# Patient Record
Sex: Female | Born: 1935 | Race: White | Hispanic: No | Marital: Single | Smoking: Former smoker
Health system: Southern US, Community
[De-identification: ages and names within clinical notes are randomized; demographics above are authoritative.]

## PROBLEM LIST (undated history)

## (undated) DIAGNOSIS — J9610 Chronic respiratory failure, unspecified whether with hypoxia or hypercapnia: Secondary | ICD-10-CM

## (undated) DIAGNOSIS — F0390 Unspecified dementia without behavioral disturbance: Secondary | ICD-10-CM

## (undated) DIAGNOSIS — M1990 Unspecified osteoarthritis, unspecified site: Secondary | ICD-10-CM

## (undated) DIAGNOSIS — J449 Chronic obstructive pulmonary disease, unspecified: Secondary | ICD-10-CM

## (undated) DIAGNOSIS — I10 Essential (primary) hypertension: Secondary | ICD-10-CM

## (undated) HISTORY — PX: JOINT REPLACEMENT: SHX530

## (undated) HISTORY — PX: BREAST LUMPECTOMY: SHX2

## (undated) HISTORY — PX: DILATION AND CURETTAGE, DIAGNOSTIC / THERAPEUTIC: SUR384

## (undated) HISTORY — PX: OTHER SURGICAL HISTORY: SHX169

## (undated) HISTORY — PX: TONSILLECTOMY: SUR1361

---

## 2011-09-09 ENCOUNTER — Emergency Department: Payer: Self-pay | Admitting: Emergency Medicine

## 2012-05-26 LAB — URINALYSIS, COMPLETE
Bacteria: NONE SEEN
Bilirubin,UR: NEGATIVE
Glucose,UR: NEGATIVE mg/dL (ref 0–75)
Nitrite: NEGATIVE
Protein: NEGATIVE
RBC,UR: <1 /HPF (ref 0–5)
Specific Gravity: 1.02 (ref 1.003–1.030)
Squamous Epithelial: NONE SEEN
WBC UR: 1 /HPF (ref 0–5)

## 2012-05-26 LAB — CBC
HCT: 36.9 % (ref 35.0–47.0)
HGB: 12.5 g/dL (ref 12.0–16.0)
MCH: 30.7 pg (ref 26.0–34.0)
MCHC: 33.8 g/dL (ref 32.0–36.0)
MCV: 91 fL (ref 80–100)
Platelet: 184 10*3/uL (ref 150–440)
RDW: 13.4 % (ref 11.5–14.5)
WBC: 13.7 10*3/uL — ABNORMAL HIGH (ref 3.6–11.0)

## 2012-05-26 LAB — BASIC METABOLIC PANEL
Anion Gap: 7 (ref 7–16)
BUN: 14 mg/dL (ref 7–18)
Calcium, Total: 8.7 mg/dL (ref 8.5–10.1)
Chloride: 96 mmol/L — ABNORMAL LOW (ref 98–107)
Co2: 29 mmol/L (ref 21–32)
Creatinine: 0.59 mg/dL — ABNORMAL LOW (ref 0.60–1.30)
EGFR (African American): >60
Glucose: 159 mg/dL — ABNORMAL HIGH (ref 65–99)
Osmolality: 268 (ref 275–301)
Sodium: 132 mmol/L — ABNORMAL LOW (ref 136–145)

## 2012-05-27 LAB — BASIC METABOLIC PANEL
Anion Gap: 4 — ABNORMAL LOW (ref 7–16)
Chloride: 95 mmol/L — ABNORMAL LOW (ref 98–107)
Co2: 31 mmol/L (ref 21–32)
Creatinine: 0.78 mg/dL (ref 0.60–1.30)
Sodium: 130 mmol/L — ABNORMAL LOW (ref 136–145)

## 2012-05-27 LAB — CBC WITH DIFFERENTIAL/PLATELET
Eosinophil #: 0 10*3/uL (ref 0.0–0.7)
HCT: 32.9 % — ABNORMAL LOW (ref 35.0–47.0)
HGB: 10.9 g/dL — ABNORMAL LOW (ref 12.0–16.0)
Lymphocyte %: 3.4 %
MCH: 30.4 pg (ref 26.0–34.0)
MCHC: 33.1 g/dL (ref 32.0–36.0)
Neutrophil #: 10.2 10*3/uL — ABNORMAL HIGH (ref 1.4–6.5)
RDW: 13.5 % (ref 11.5–14.5)

## 2012-05-28 ENCOUNTER — Inpatient Hospital Stay: Payer: Self-pay | Admitting: Internal Medicine

## 2012-05-31 LAB — SODIUM: Sodium: 131 mmol/L — ABNORMAL LOW (ref 136–145)

## 2012-06-01 ENCOUNTER — Encounter: Payer: Self-pay | Admitting: Internal Medicine

## 2012-06-03 LAB — CULTURE, BLOOD (SINGLE)

## 2012-06-15 ENCOUNTER — Encounter: Payer: Self-pay | Admitting: Internal Medicine

## 2012-07-16 ENCOUNTER — Encounter: Payer: Self-pay | Admitting: Internal Medicine

## 2012-07-19 ENCOUNTER — Emergency Department: Payer: Self-pay | Admitting: Emergency Medicine

## 2012-09-15 HISTORY — PX: TONSILLECTOMY: SHX5217

## 2012-12-23 LAB — URINALYSIS, COMPLETE
Bacteria: NONE SEEN
Blood: NEGATIVE
Nitrite: NEGATIVE
Protein: NEGATIVE
Squamous Epithelial: <1

## 2012-12-23 LAB — COMPREHENSIVE METABOLIC PANEL
Alkaline Phosphatase: 93 U/L (ref 50–136)
Anion Gap: 5 — ABNORMAL LOW (ref 7–16)
BUN: 8 mg/dL (ref 7–18)
Bilirubin,Total: 0.5 mg/dL (ref 0.2–1.0)
Calcium, Total: 8.9 mg/dL (ref 8.5–10.1)
Co2: 31 mmol/L (ref 21–32)
Creatinine: 0.51 mg/dL — ABNORMAL LOW (ref 0.60–1.30)
EGFR (African American): >60
EGFR (Non-African Amer.): >60
Potassium: 4.1 mmol/L (ref 3.5–5.1)
SGOT(AST): 21 U/L (ref 15–37)
Sodium: 132 mmol/L — ABNORMAL LOW (ref 136–145)
Total Protein: 6.7 g/dL (ref 6.4–8.2)

## 2012-12-23 LAB — CK TOTAL AND CKMB (NOT AT ARMC)
CK, Total: 91 U/L (ref 21–215)
CK-MB: 1.3 ng/mL (ref 0.5–3.6)

## 2012-12-23 LAB — CBC WITH DIFFERENTIAL/PLATELET
Basophil %: 0.4 %
Eosinophil #: 0 10*3/uL (ref 0.0–0.7)
Eosinophil %: 0.4 %
HCT: 39.4 % (ref 35.0–47.0)
HGB: 13.2 g/dL (ref 12.0–16.0)
Lymphocyte %: 5.2 %
MCH: 30 pg (ref 26.0–34.0)
MCHC: 33.4 g/dL (ref 32.0–36.0)
MCV: 90 fL (ref 80–100)
Monocyte %: 10.2 %
Platelet: 245 10*3/uL (ref 150–440)
RBC: 4.39 10*6/uL (ref 3.80–5.20)
WBC: 10.5 10*3/uL (ref 3.6–11.0)

## 2012-12-23 LAB — TROPONIN I: Troponin-I: <0.02 ng/mL

## 2012-12-24 ENCOUNTER — Inpatient Hospital Stay: Payer: Self-pay | Admitting: Internal Medicine

## 2012-12-26 LAB — CBC WITH DIFFERENTIAL/PLATELET
Basophil #: 0 10*3/uL (ref 0.0–0.1)
Basophil %: 0.2 %
Eosinophil %: 0.1 %
HGB: 11.5 g/dL — ABNORMAL LOW (ref 12.0–16.0)
Lymphocyte #: 0.3 10*3/uL — ABNORMAL LOW (ref 1.0–3.6)
MCH: 29.2 pg (ref 26.0–34.0)
MCHC: 32.6 g/dL (ref 32.0–36.0)
Monocyte %: 7.4 %
Neutrophil #: 12.1 10*3/uL — ABNORMAL HIGH (ref 1.4–6.5)
Neutrophil %: 89.8 %
RDW: 13.2 % (ref 11.5–14.5)

## 2012-12-27 LAB — BASIC METABOLIC PANEL
Anion Gap: 5 — ABNORMAL LOW (ref 7–16)
BUN: 13 mg/dL (ref 7–18)
Calcium, Total: 8.9 mg/dL (ref 8.5–10.1)
Creatinine: 0.45 mg/dL — ABNORMAL LOW (ref 0.60–1.30)
EGFR (African American): >60
EGFR (Non-African Amer.): >60
Glucose: 127 mg/dL — ABNORMAL HIGH (ref 65–99)
Osmolality: 270 (ref 275–301)
Potassium: 3.5 mmol/L (ref 3.5–5.1)
Sodium: 134 mmol/L — ABNORMAL LOW (ref 136–145)

## 2012-12-29 LAB — CBC WITH DIFFERENTIAL/PLATELET
Basophil #: 0 10*3/uL (ref 0.0–0.1)
Basophil %: 0.1 %
Eosinophil %: 0 %
HCT: 35.6 % (ref 35.0–47.0)
Lymphocyte %: 2.5 %
MCH: 30.1 pg (ref 26.0–34.0)
MCHC: 33.7 g/dL (ref 32.0–36.0)
Monocyte %: 2.4 %
Neutrophil #: 10.3 10*3/uL — ABNORMAL HIGH (ref 1.4–6.5)
Neutrophil %: 95 %
Platelet: 292 10*3/uL (ref 150–440)
RBC: 3.99 10*6/uL (ref 3.80–5.20)
RDW: 13 % (ref 11.5–14.5)

## 2012-12-31 LAB — BASIC METABOLIC PANEL
Anion Gap: 8 (ref 7–16)
BUN: 19 mg/dL — ABNORMAL HIGH (ref 7–18)
BUN: 19 mg/dL — ABNORMAL HIGH (ref 7–18)
Calcium, Total: 8.4 mg/dL — ABNORMAL LOW (ref 8.5–10.1)
Calcium, Total: 8.2 mg/dL — ABNORMAL LOW (ref 8.5–10.1)
Chloride: 96 mmol/L — ABNORMAL LOW (ref 98–107)
Chloride: 103 mmol/L (ref 98–107)
Creatinine: 0.5 mg/dL — ABNORMAL LOW (ref 0.60–1.30)
Creatinine: 0.5 mg/dL — ABNORMAL LOW (ref 0.60–1.30)
EGFR (African American): >60
Glucose: 193 mg/dL — ABNORMAL HIGH (ref 65–99)
Osmolality: 271 (ref 275–301)
Osmolality: 287 (ref 275–301)
Potassium: 3.9 mmol/L (ref 3.5–5.1)
Sodium: 132 mmol/L — ABNORMAL LOW (ref 136–145)
Sodium: 140 mmol/L (ref 136–145)

## 2012-12-31 LAB — CBC WITH DIFFERENTIAL/PLATELET
Eosinophil #: 0 10*3/uL (ref 0.0–0.7)
HCT: 38 % (ref 35.0–47.0)
HGB: 12.2 g/dL (ref 12.0–16.0)
Lymphocyte %: 3.3 %
MCH: 28.9 pg (ref 26.0–34.0)
MCHC: 32.1 g/dL (ref 32.0–36.0)
Neutrophil #: 14.9 10*3/uL — ABNORMAL HIGH (ref 1.4–6.5)
RDW: 13.1 % (ref 11.5–14.5)
WBC: 16.2 10*3/uL — ABNORMAL HIGH (ref 3.6–11.0)

## 2013-01-01 LAB — BASIC METABOLIC PANEL
Anion Gap: 4 — ABNORMAL LOW (ref 7–16)
BUN: 11 mg/dL (ref 7–18)
Calcium, Total: 8.5 mg/dL (ref 8.5–10.1)
Chloride: 97 mmol/L — ABNORMAL LOW (ref 98–107)
Co2: 35 mmol/L — ABNORMAL HIGH (ref 21–32)
Creatinine: 0.28 mg/dL — ABNORMAL LOW (ref 0.60–1.30)
EGFR (African American): >60
Glucose: 144 mg/dL — ABNORMAL HIGH (ref 65–99)
Osmolality: 274 (ref 275–301)
Potassium: 3.1 mmol/L — ABNORMAL LOW (ref 3.5–5.1)

## 2013-01-01 LAB — WBC: WBC: 12.7 10*3/uL — ABNORMAL HIGH (ref 3.6–11.0)

## 2013-01-02 LAB — CULTURE, BLOOD (SINGLE)

## 2013-01-02 LAB — POTASSIUM: Potassium: 3.5 mmol/L (ref 3.5–5.1)

## 2013-01-03 LAB — BASIC METABOLIC PANEL
Anion Gap: 5 — ABNORMAL LOW (ref 7–16)
Calcium, Total: 8.1 mg/dL — ABNORMAL LOW (ref 8.5–10.1)
Chloride: 95 mmol/L — ABNORMAL LOW (ref 98–107)
Creatinine: 0.39 mg/dL — ABNORMAL LOW (ref 0.60–1.30)
EGFR (Non-African Amer.): >60
Osmolality: 270 (ref 275–301)
Potassium: 3.1 mmol/L — ABNORMAL LOW (ref 3.5–5.1)
Sodium: 134 mmol/L — ABNORMAL LOW (ref 136–145)

## 2013-02-13 ENCOUNTER — Ambulatory Visit: Payer: Self-pay | Admitting: Internal Medicine

## 2013-02-26 LAB — COMPREHENSIVE METABOLIC PANEL
Albumin: 3.3 g/dL — ABNORMAL LOW (ref 3.4–5.0)
Alkaline Phosphatase: 99 U/L (ref 50–136)
BUN: 15 mg/dL (ref 7–18)
Calcium, Total: 9.1 mg/dL (ref 8.5–10.1)
Co2: 35 mmol/L — ABNORMAL HIGH (ref 21–32)
Glucose: 121 mg/dL — ABNORMAL HIGH (ref 65–99)
Potassium: 3.9 mmol/L (ref 3.5–5.1)
SGOT(AST): 29 U/L (ref 15–37)
SGPT (ALT): 19 U/L (ref 12–78)
Sodium: 134 mmol/L — ABNORMAL LOW (ref 136–145)
Total Protein: 6.7 g/dL (ref 6.4–8.2)

## 2013-02-26 LAB — CBC WITH DIFFERENTIAL/PLATELET
Basophil #: 0.1 10*3/uL (ref 0.0–0.1)
Basophil %: 1.2 %
Eosinophil %: 1.4 %
HCT: 34.8 % — ABNORMAL LOW (ref 35.0–47.0)
Lymphocyte #: 0.9 10*3/uL — ABNORMAL LOW (ref 1.0–3.6)
Lymphocyte %: 13.7 %
MCHC: 33.7 g/dL (ref 32.0–36.0)
MCV: 90 fL (ref 80–100)
Monocyte %: 10.8 %
RBC: 3.89 10*6/uL (ref 3.80–5.20)
RDW: 15.8 % — ABNORMAL HIGH (ref 11.5–14.5)
WBC: 6.7 10*3/uL (ref 3.6–11.0)

## 2013-02-26 LAB — TROPONIN I: Troponin-I: <0.02 ng/mL

## 2013-02-26 LAB — CK TOTAL AND CKMB (NOT AT ARMC)
CK, Total: 44 U/L (ref 21–215)
CK-MB: 2.1 ng/mL (ref 0.5–3.6)

## 2013-02-26 LAB — PRO B NATRIURETIC PEPTIDE: B-Type Natriuretic Peptide: 605 pg/mL — ABNORMAL HIGH (ref 0–450)

## 2013-02-27 ENCOUNTER — Inpatient Hospital Stay: Payer: Self-pay | Admitting: Internal Medicine

## 2013-02-27 LAB — CBC WITH DIFFERENTIAL/PLATELET
Basophil #: 0 10*3/uL (ref 0.0–0.1)
Basophil %: 0.1 %
Eosinophil %: 0.1 %
HGB: 11.3 g/dL — ABNORMAL LOW (ref 12.0–16.0)
Lymphocyte %: 8.9 %
MCH: 30.2 pg (ref 26.0–34.0)
MCV: 89 fL (ref 80–100)
Monocyte %: 1.9 %
Neutrophil #: 4.9 10*3/uL (ref 1.4–6.5)
Neutrophil %: 89 %
Platelet: 201 10*3/uL (ref 150–440)
RDW: 15.9 % — ABNORMAL HIGH (ref 11.5–14.5)
WBC: 5.5 10*3/uL (ref 3.6–11.0)

## 2013-02-27 LAB — BASIC METABOLIC PANEL
Anion Gap: 6 — ABNORMAL LOW (ref 7–16)
BUN: 11 mg/dL (ref 7–18)
Calcium, Total: 8.9 mg/dL (ref 8.5–10.1)
Chloride: 99 mmol/L (ref 98–107)
Co2: 31 mmol/L (ref 21–32)
Creatinine: 0.59 mg/dL — ABNORMAL LOW (ref 0.60–1.30)
Potassium: 4.1 mmol/L (ref 3.5–5.1)
Sodium: 136 mmol/L (ref 136–145)

## 2013-02-28 LAB — IRON AND TIBC
Iron Saturation: 37 %
Unbound Iron-Bind.Cap.: 162 ug/dL

## 2013-03-01 LAB — CREATININE, SERUM: EGFR (Non-African Amer.): >60

## 2014-03-01 ENCOUNTER — Inpatient Hospital Stay: Payer: Self-pay | Admitting: Internal Medicine

## 2014-03-01 LAB — CK TOTAL AND CKMB (NOT AT ARMC)
CK, Total: 69 U/L
CK-MB: 1.7 ng/mL (ref 0.5–3.6)

## 2014-03-01 LAB — COMPREHENSIVE METABOLIC PANEL
Albumin: 3.2 g/dL — ABNORMAL LOW (ref 3.4–5.0)
Alkaline Phosphatase: 76 U/L
Anion Gap: 7 (ref 7–16)
BILIRUBIN TOTAL: 0.6 mg/dL (ref 0.2–1.0)
BUN: 11 mg/dL (ref 7–18)
CALCIUM: 8.7 mg/dL (ref 8.5–10.1)
CO2: 32 mmol/L (ref 21–32)
Chloride: 92 mmol/L — ABNORMAL LOW (ref 98–107)
Creatinine: 0.63 mg/dL (ref 0.60–1.30)
EGFR (African American): >60
Glucose: 136 mg/dL — ABNORMAL HIGH (ref 65–99)
Osmolality: 264 (ref 275–301)
Potassium: 3.5 mmol/L (ref 3.5–5.1)
SGOT(AST): 21 U/L (ref 15–37)
SGPT (ALT): 16 U/L (ref 12–78)
SODIUM: 131 mmol/L — AB (ref 136–145)
Total Protein: 7.1 g/dL (ref 6.4–8.2)

## 2014-03-01 LAB — CBC
HCT: 34.9 % — ABNORMAL LOW (ref 35.0–47.0)
HGB: 11.6 g/dL — ABNORMAL LOW (ref 12.0–16.0)
MCH: 31.1 pg (ref 26.0–34.0)
MCHC: 33.3 g/dL (ref 32.0–36.0)
MCV: 94 fL (ref 80–100)
Platelet: 210 10*3/uL (ref 150–440)
RBC: 3.73 10*6/uL — ABNORMAL LOW (ref 3.80–5.20)
RDW: 13.1 % (ref 11.5–14.5)
WBC: 9.6 10*3/uL (ref 3.6–11.0)

## 2014-03-01 LAB — TROPONIN I: Troponin-I: <0.02 ng/mL

## 2014-03-02 LAB — CBC WITH DIFFERENTIAL/PLATELET
Basophil #: 0 10*3/uL (ref 0.0–0.1)
Basophil %: 0.1 %
Eosinophil #: 0 10*3/uL (ref 0.0–0.7)
Eosinophil %: 0 %
HCT: 31.7 % — ABNORMAL LOW (ref 35.0–47.0)
HGB: 10.7 g/dL — ABNORMAL LOW (ref 12.0–16.0)
Lymphocyte #: 0.5 10*3/uL — ABNORMAL LOW (ref 1.0–3.6)
Lymphocyte %: 4.4 %
MCH: 31.1 pg (ref 26.0–34.0)
MCHC: 33.6 g/dL (ref 32.0–36.0)
MCV: 93 fL (ref 80–100)
MONOS PCT: 5.3 %
Monocyte #: 0.6 x10 3/mm (ref 0.2–0.9)
NEUTROS PCT: 90.2 %
Neutrophil #: 10.5 10*3/uL — ABNORMAL HIGH (ref 1.4–6.5)
Platelet: 253 10*3/uL (ref 150–440)
RBC: 3.43 10*6/uL — ABNORMAL LOW (ref 3.80–5.20)
RDW: 13.1 % (ref 11.5–14.5)
WBC: 11.7 10*3/uL — ABNORMAL HIGH (ref 3.6–11.0)

## 2014-03-02 LAB — URINALYSIS, COMPLETE
BILIRUBIN, UR: NEGATIVE
Blood: NEGATIVE
Glucose,UR: 150 mg/dL (ref 0–75)
Ketone: NEGATIVE
Leukocyte Esterase: NEGATIVE
Nitrite: NEGATIVE
PROTEIN: NEGATIVE
Ph: 5 (ref 4.5–8.0)
RBC,UR: <1 /HPF (ref 0–5)
Specific Gravity: 1.017 (ref 1.003–1.030)
Squamous Epithelial: <1
WBC UR: 3 /HPF (ref 0–5)

## 2014-03-02 LAB — CREATININE, SERUM
Creatinine: 0.68 mg/dL (ref 0.60–1.30)
EGFR (African American): >60

## 2014-03-03 LAB — BASIC METABOLIC PANEL
Anion Gap: 6 — ABNORMAL LOW (ref 7–16)
BUN: 16 mg/dL (ref 7–18)
CHLORIDE: 92 mmol/L — AB (ref 98–107)
Calcium, Total: 9.4 mg/dL (ref 8.5–10.1)
Co2: 32 mmol/L (ref 21–32)
Creatinine: 0.66 mg/dL (ref 0.60–1.30)
GLUCOSE: 176 mg/dL — AB (ref 65–99)
OSMOLALITY: 266 (ref 275–301)
POTASSIUM: 3.8 mmol/L (ref 3.5–5.1)
Sodium: 130 mmol/L — ABNORMAL LOW (ref 136–145)

## 2014-03-03 LAB — MAGNESIUM: Magnesium: 1.5 mg/dL — ABNORMAL LOW

## 2014-03-04 LAB — BASIC METABOLIC PANEL
ANION GAP: 5 — AB (ref 7–16)
BUN: 15 mg/dL (ref 7–18)
CO2: 34 mmol/L — AB (ref 21–32)
CREATININE: 0.61 mg/dL (ref 0.60–1.30)
Calcium, Total: 8.5 mg/dL (ref 8.5–10.1)
Chloride: 96 mmol/L — ABNORMAL LOW (ref 98–107)
EGFR (African American): >60
EGFR (Non-African Amer.): >60
Glucose: 194 mg/dL — ABNORMAL HIGH (ref 65–99)
Osmolality: 276 (ref 275–301)
Potassium: 3.6 mmol/L (ref 3.5–5.1)
Sodium: 135 mmol/L — ABNORMAL LOW (ref 136–145)

## 2014-03-04 LAB — MAGNESIUM: Magnesium: 2.1 mg/dL

## 2014-03-06 LAB — CULTURE, BLOOD (SINGLE)

## 2014-06-19 IMAGING — CT CT HEAD WITHOUT CONTRAST
2 series · 15 of 30 positions shown, 19 images · non-contrast
Comparison: none

REASON FOR EXAM: trauma, fall, head injury
COMMENTS:

[Series 2: without · axial · non-contrast · 0.42mm/px · z∈[+436,+561]mm · 13 of 31 slices shown, 17 images]
[im 3/31  brain]
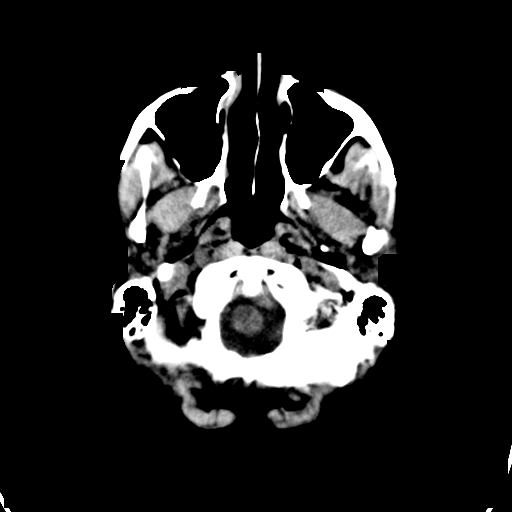
[im 3/31  bone]
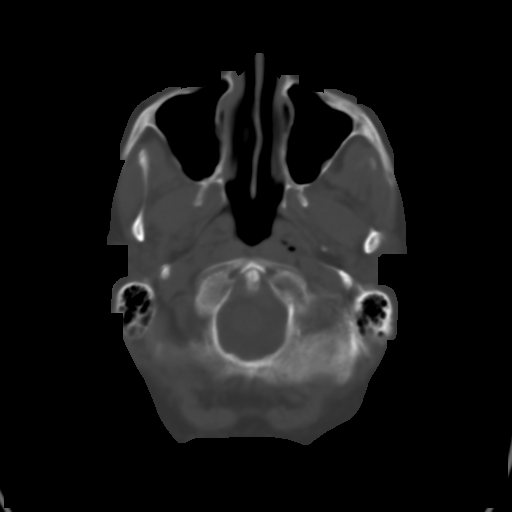
[im 5/31  brain]
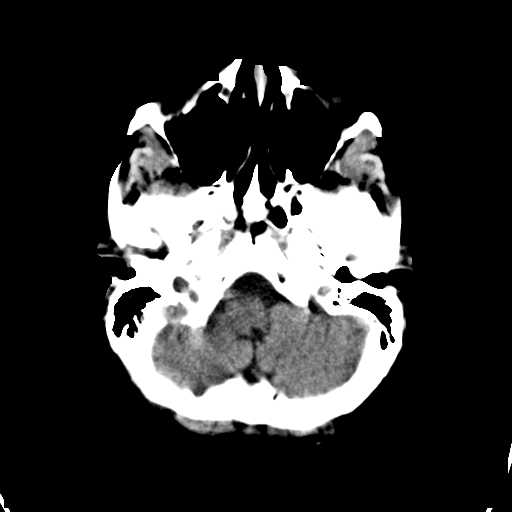
[im 7/31  brain]
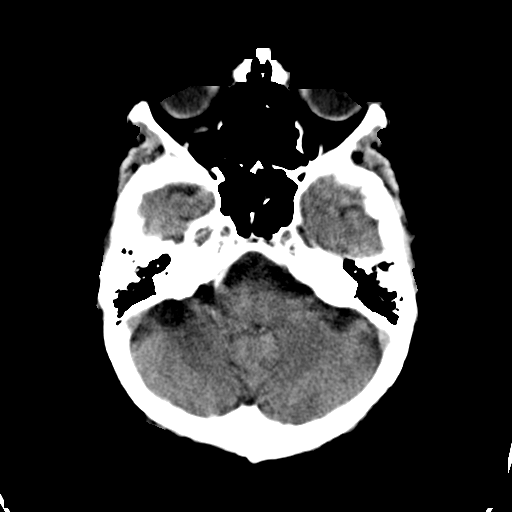
[im 9/31  brain]
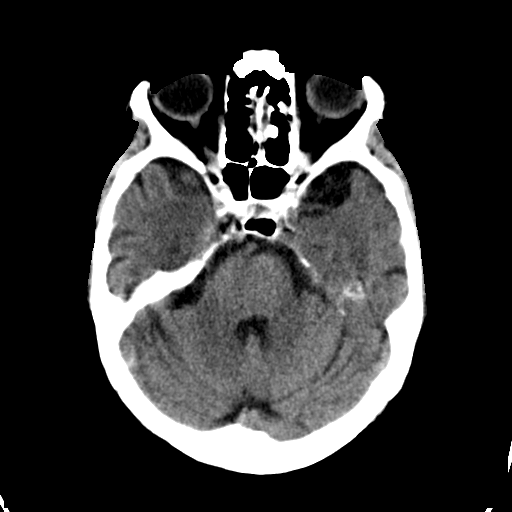
[im 11/31  brain]
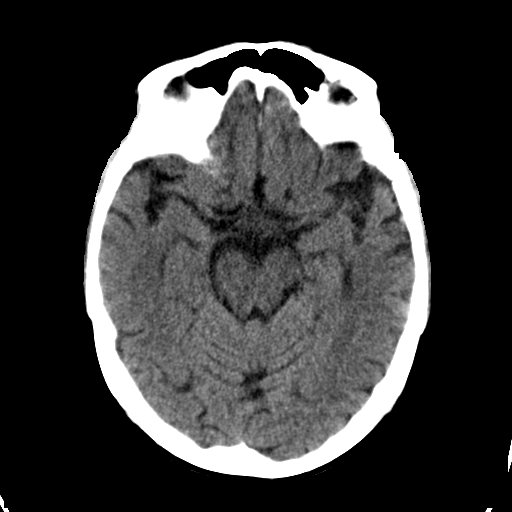
[im 11/31  bone]
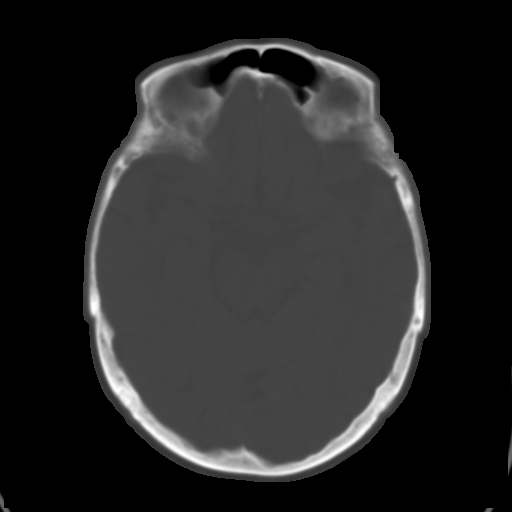
[im 13/31  brain]
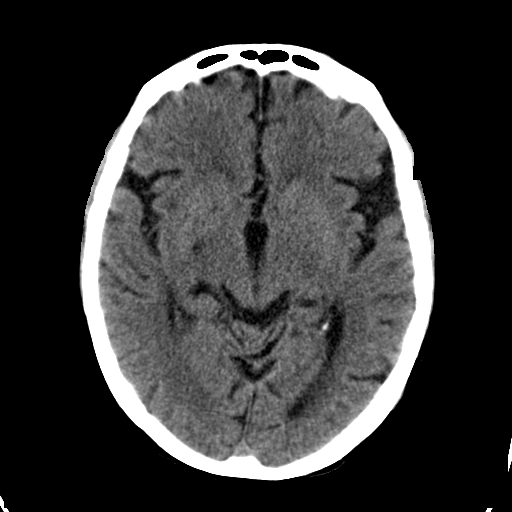
[im 16/31  brain]
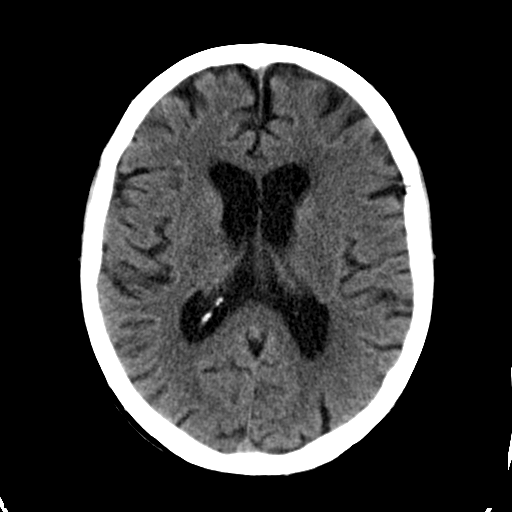
[im 18/31  brain]
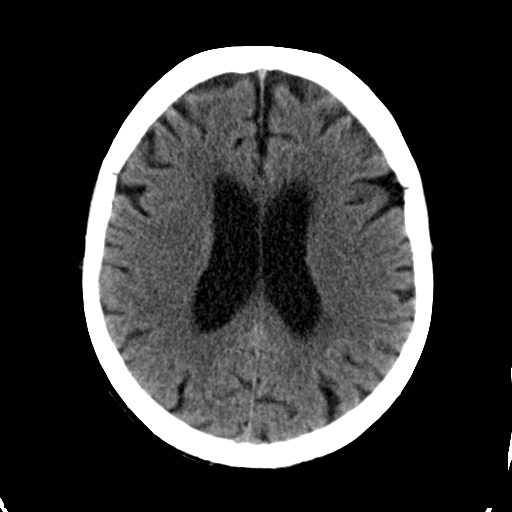
[im 20/31  brain]
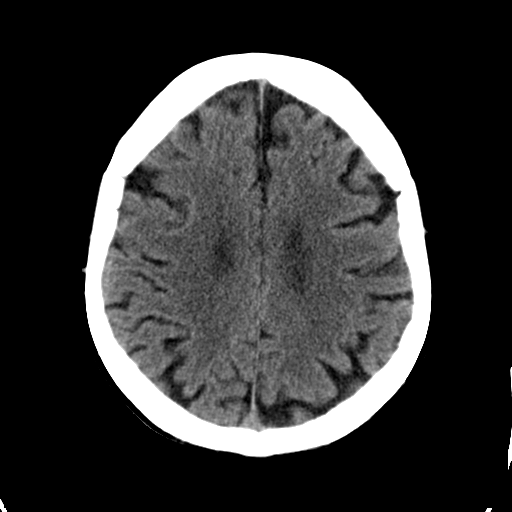
[im 20/31  bone]
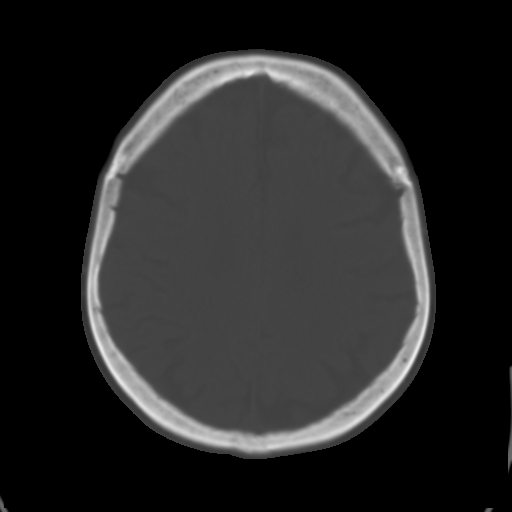
[im 22/31  brain]
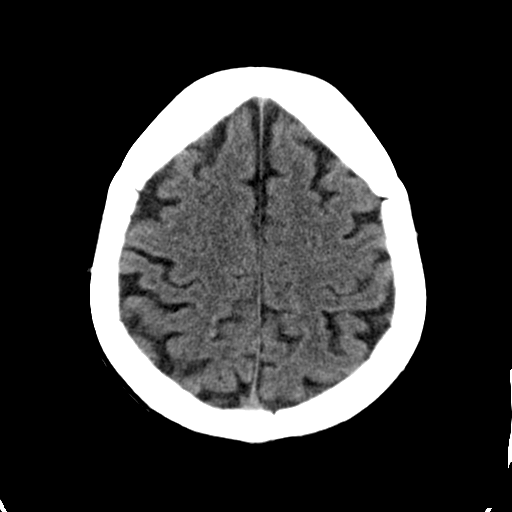
[im 24/31  brain]
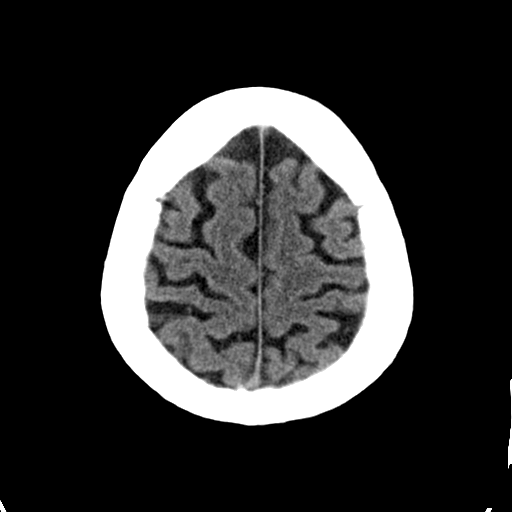
[im 26/31  brain]
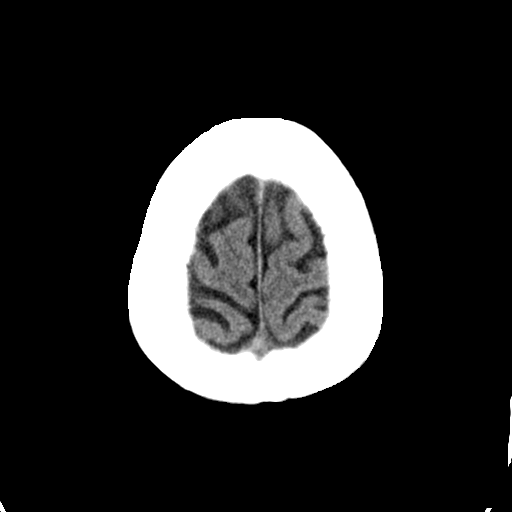
[im 28/31  brain]
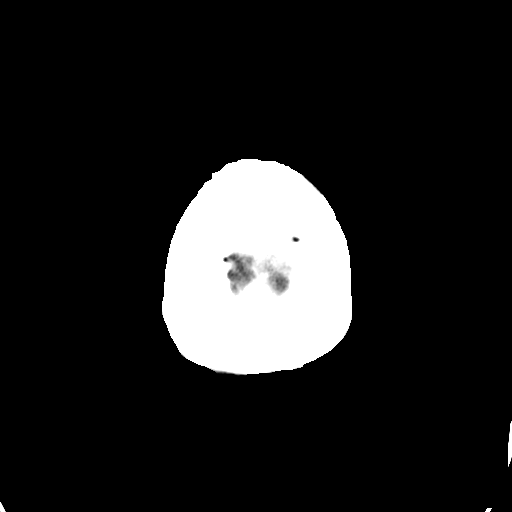
[im 28/31  bone]
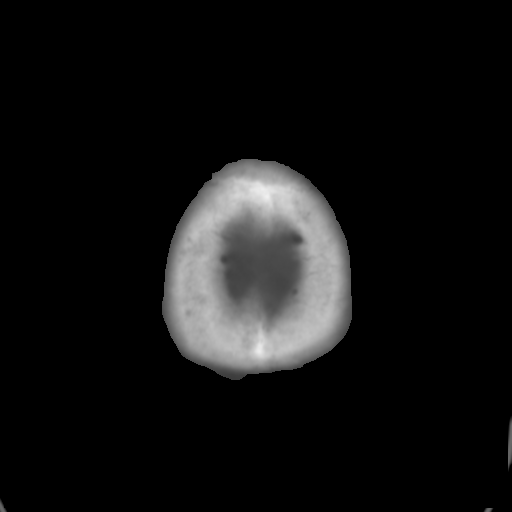

[Series 3: bone · axial · 0.42mm/px · z∈[+436,+456]mm · 2 of 31 slices shown]
[im 3/31  bone]
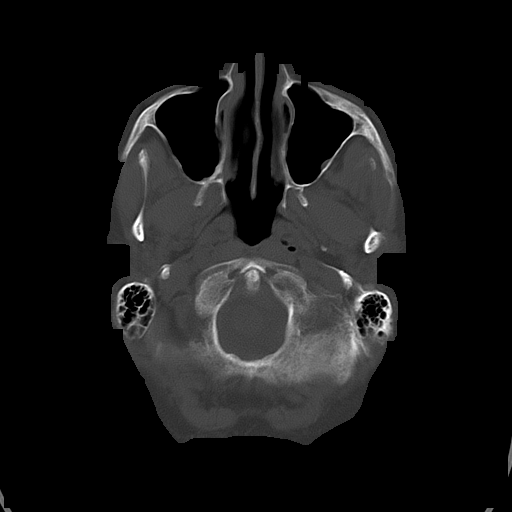
[im 7/31  bone]
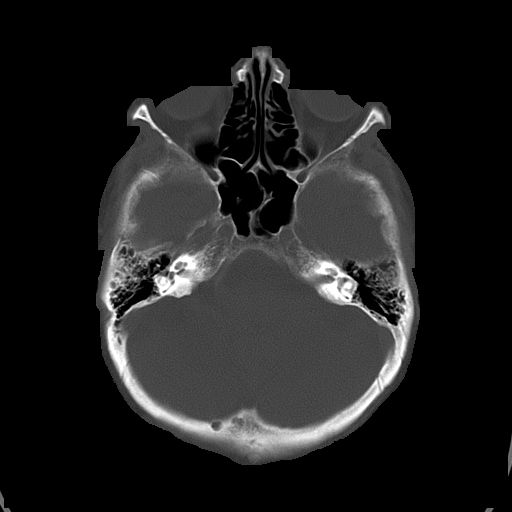

[15 of 30 positions shown; findings below may reference images not displayed]

PROCEDURE:     CT  - CT HEAD WITHOUT CONTRAST  - July 19, 2012  [DATE]

RESULT:     Axial noncontrast CT scanning was performed through the brain
with reconstructions at 5 mm intervals and slice thicknesses. Comparison is
made to the study May 26, 2012.

There is moderate diffuse cerebral and cerebellar atrophy with compensatory
ventriculomegaly. These findings are stable. There is no intracranial
hemorrhage nor intracranial mass effect. There is likely an old lacunar
infarction in the posterior limb of the right internal capsule. There is no
evidence of an evolving ischemic infarction. At bone window settings the
observed portions of the paranasal sinuses and mastoid air cells are clear.
There is no evidence of an acute skull fracture.
IMPRESSION: 1. There is no evidence of an acute ischemic or hemorrhagic event.
2. There are age-related atrophic changes with mild compensatory
ventriculomegaly which appears stable.
3. There is no evidence of an acute skull fracture.

[REDACTED]

## 2014-06-19 IMAGING — CR PELVIS - 1-2 VIEW
1 series · 2 of 2 positions shown · non-contrast
Comparison: none

REASON FOR EXAM: fall, pelvis pain
COMMENTS:

[Series 1: ap · 0.17mm/px · 2 of 2 slices shown]
[im 1/2]
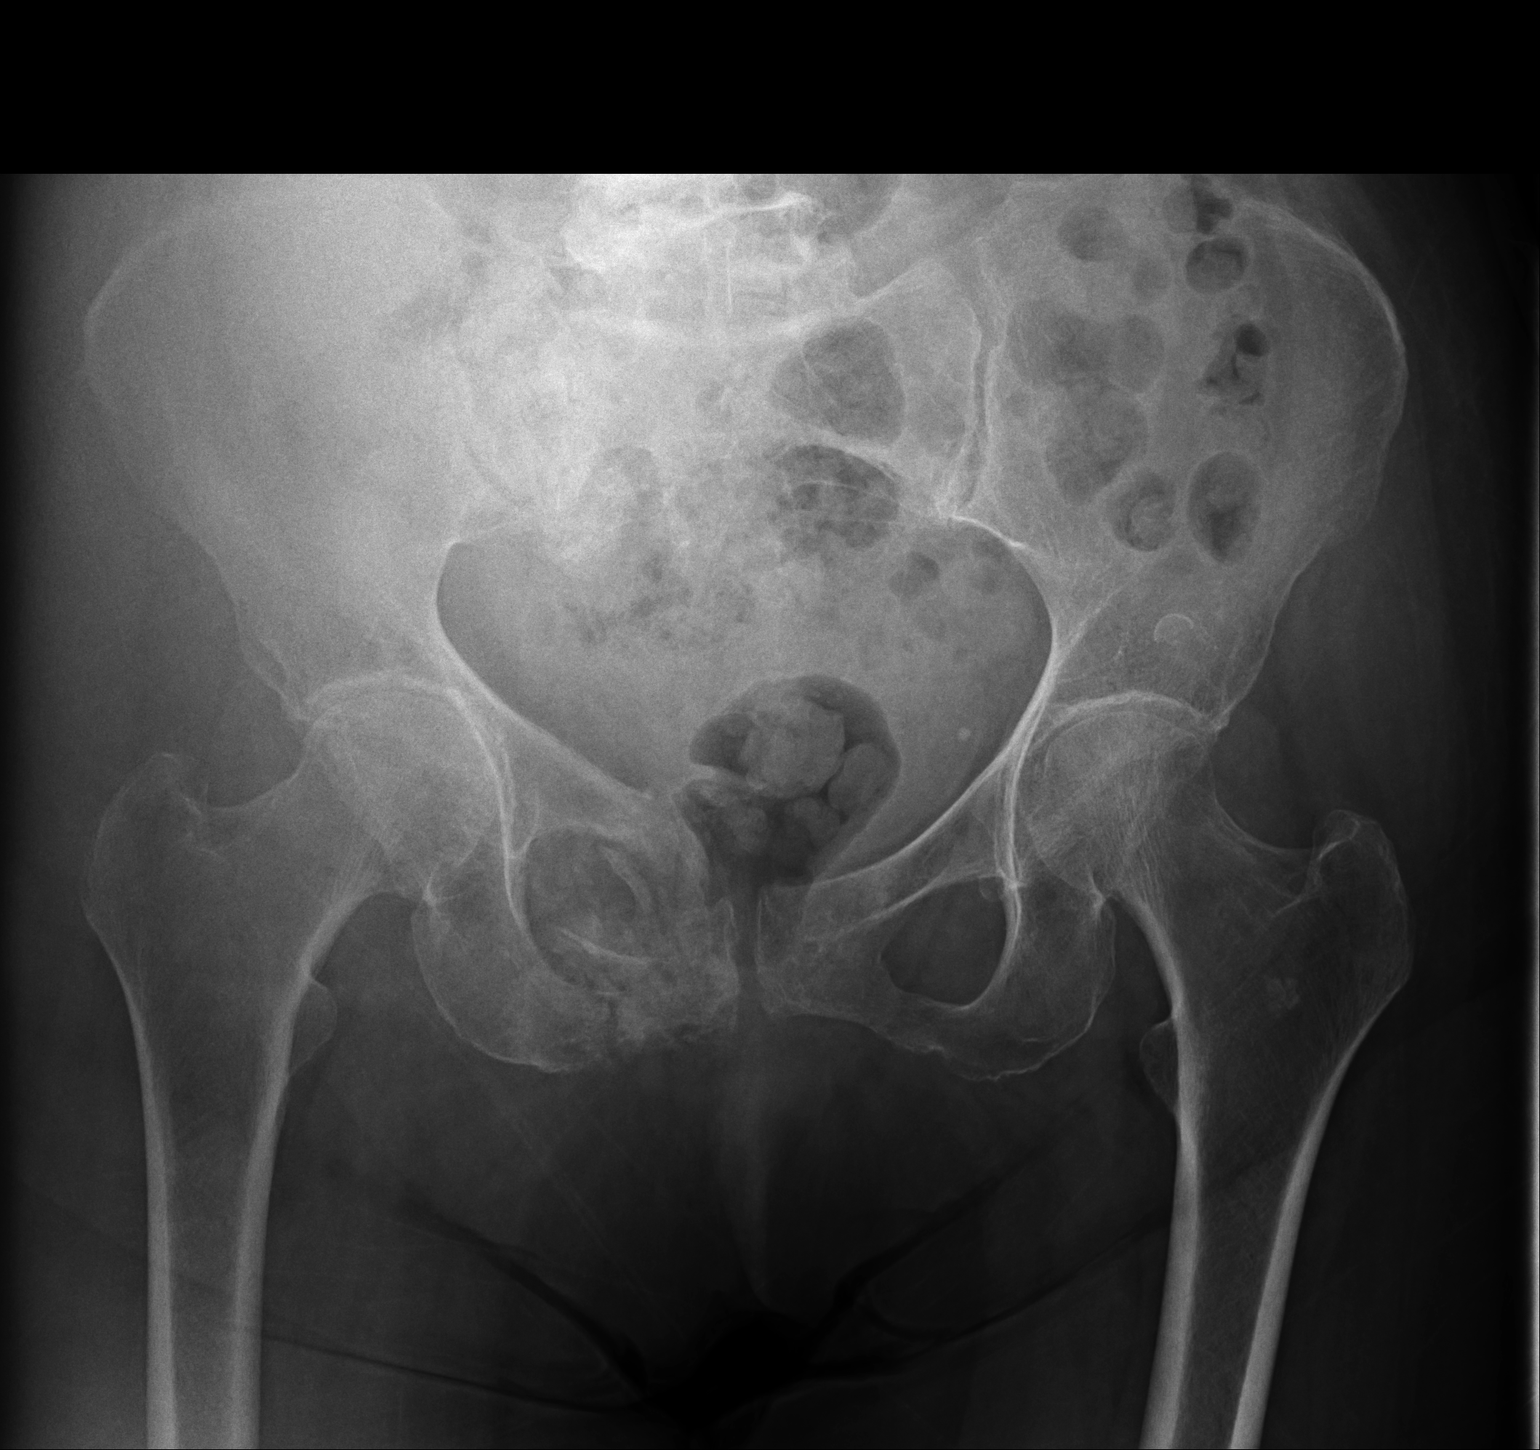
[im 2/2]
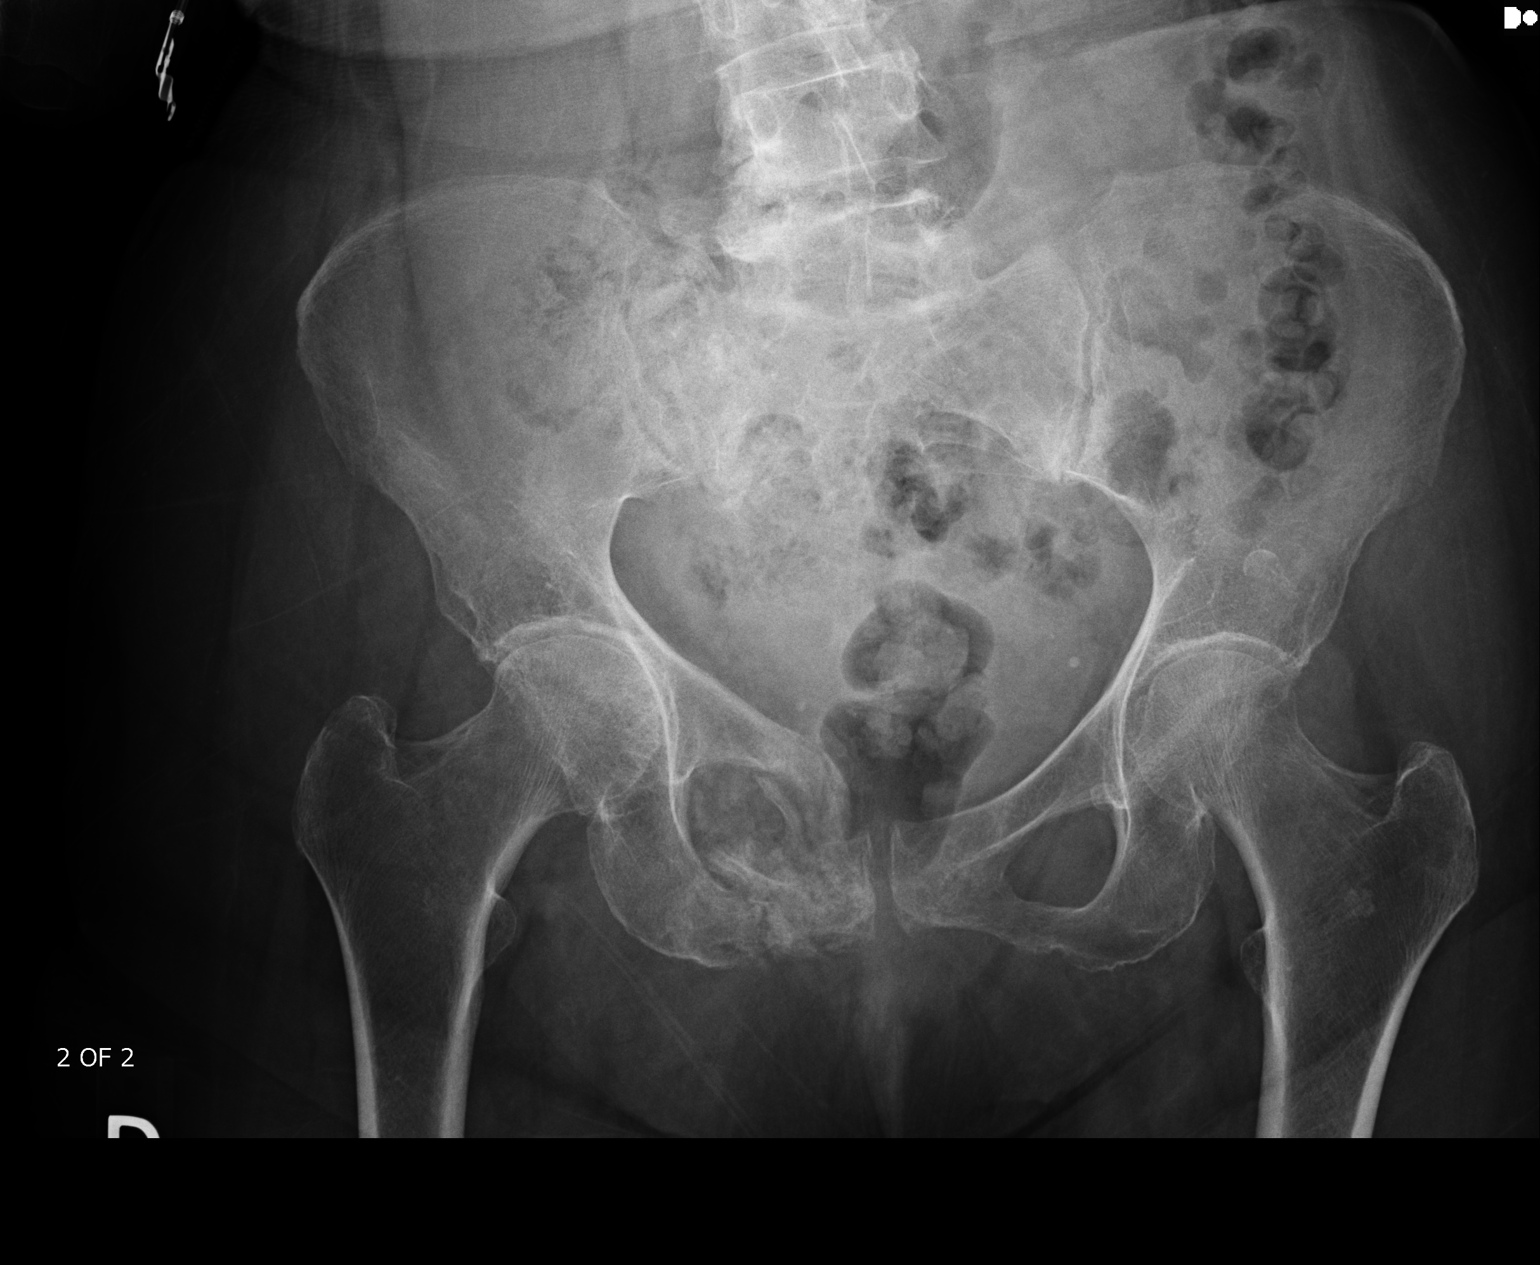

[2 of 2 positions shown; findings below may reference images not displayed]

PROCEDURE:     DXR - DXR PELVIS AP ONLY  - July 19, 2012  [DATE]

RESULT:     The bony pelvis is osteopenic. There are degenerative changes of
both hips. There is deformity of the right superior and inferior pubic rami.
There is evidence of bony remodeling around the previously demonstrated
comminuted fracture of the pubic bones on May 26, 2012. The left
hemipelvis appears intact. As best as can be determined the sacrum is
intact. There is a lordosis associated with the SI joints.
IMPRESSION: There is no evidence of an acute fracture of the pelvis in
this patient with on- going healing of medial right superior and inferior
pubic ramus fractures.

[REDACTED]

## 2015-01-02 NOTE — Consult Note (Signed)
Brief Consult Note: Diagnosis: Right superior and inferior pubic rami fractures of the pelvis.   Patient was seen by consultant.   Recommend further assessment or treatment.   Comments: Patient is a pleasant 81 y/o female who fell [facility] to [facility].  She does not recall the exact mechanism of the fall but states it was dark at the time.  Patient denies significant right hip pain while lying in bed this AM.  She denies numbness tingling or other areas of pain.  She is confused to place this AM and could remember that she was at [facility].  On exam, patient has intact skin.  She can flex and extend her ankle and toes on the right side.  Patient has intact sensation to light touch on the right lower extremity and palpable pedal pulses.  Her radiographs demonstrate comminuted fractures of the superior and inferior rami with mild displacement.  Recommend treating her pain symptomatically.  She should be evaluated by PT for transfers to a chair and patient should be partial WB on the right lower extremity as her pain allows.  She will need a rehab stay and should follow-up in my office in 4-6 weeks for re-evaluation and x-ray.  Electronic Signatures: Juanell FairlyKrasinski, Mayci Haning (MD)  (Signed 12-Sep-13 12:01)  Authored: Brief Consult Note   Last Updated: 12-Sep-13 12:01 by Juanell FairlyKrasinski, Lorell Thibodaux (MD)

## 2015-01-02 NOTE — H&P (Signed)
PATIENT NAME:  Holly Drake, Holly Drake MR#:  [id number] DATE OF BIRTH:  02/26/1924  DATE OF ADMISSION:  05/26/2012  ADMITTING PHYSICIAN: Dr. Enid Baas  PRIMARY CARE PHYSICIAN: Dr. Alonna Buckler  CHIEF COMPLAINT: Fall and right hip pain.   HISTORY OF PRESENT ILLNESS: Holly Drake is an 81 year old elderly female who lives at [facility] independent living facility brought in after she was found on the floor this morning. History is given mostly by the daughter. According to daughter, patient lives at independent living facility and she was last seen by daughter yesterday and was in her normal state of health. This morning patient has a home nurse who comes every morning to give her medications, found her on the floor. It seems apparently according to the patient that last night she went to turn off the light and then in the dark she got tripped and fell on the floor and laid there since last night. She denies any other complaints at this time and is slightly confused and does not have any pain when she is lying down but complains of pain only when she is moving. She is found to have a superior and inferior right pubic rami fracture and being admitted for the same. She denies any fevers, chills, nausea, vomiting, or any other systemic complaints lately.   PAST MEDICAL HISTORY:  1. Depression.  2. Hypertension.  3. Rheumatoid arthritis.  4. Macular degeneration.  PAST SURGICAL HISTORY:  1. Skin cancer resection from the back. 2. Benign cyst removal from the breast.  3. Partial hysterectomy.   ALLERGIES: No known drug allergies.   CURRENT HOME MEDICATIONS:  1. Losartan 50 mg p.o. daily.  2. Sulfasalazine 500 mg p.o. b.i.d.  3. Tylenol Extra Strength PM 500 mg/25 mg, 1 tablet at bedtime. 4. Ginger oral capsule 1 tablet daily.  5. Cranberry oral tablet 1 tablet daily.  6. Celexa 20 mg p.o. daily.  7. Caltrate 600 mg oral daily.   SOCIAL HISTORY: Lives at [facility] independent living facility. No  smoking currently; used to smoke more than 30 years ago. No alcohol use.   FAMILY HISTORY: Mom with Parkinson's, dad lived up to 37 and had a stroke at that age.   REVIEW OF SYSTEMS: CONSTITUTIONAL: No fever, fatigue, or weakness. EYES: Positive for macular degeneration resulting in blurred vision. No glaucoma or cataracts. ENT: Positive for hearing loss and has hearing aids. No tinnitus, ear pain, epistaxis or discharge. RESPIRATORY: No cough, wheeze, hemoptysis, or chronic obstructive pulmonary disease. CARDIOVASCULAR: No chest pain, orthopnea, edema, arrhythmia, palpitations, or syncope. GASTROINTESTINAL: Was nauseous and had vomiting today after she came to the Emergency Room but none before that. No diarrhea, abdominal pain, hematemesis, or melena. GENITOURINARY: Patient is incontinent. No dysuria, hematuria, renal calculus, or frequency. ENDOCRINE: No polyuria, nocturia, thyroid problems, heat or cold intolerance. HEMATOLOGY: No anemia, easy bruising or bleeding. SKIN: No acne, rash, or lesions. MUSCULOSKELETAL: Positive for joint pain as she has rheumatoid arthritis. NEUROLOGIC: No CVA, transient ischemic attack, or seizures. Uses a cane to walk. PSYCHOLOGICAL: No anxiety, insomnia. Positive for depression, not depressed currently.   PHYSICAL EXAMINATION:  VITAL SIGNS: Temperature 97.9 degrees Fahrenheit, pulse 104, respirations 20, blood pressure 178/83, pulse ox 97% on room air.   GENERAL: Well built, well nourished elderly female lying in bed, not in any acute distress.   HEENT: Normocephalic, atraumatic. Pupils equal, round, reacting to light. Anicteric sclerae. Extraocular movements intact. Oropharynx clear without erythema, mass or exudates.   NECK: Supple. No  thyromegaly. No JVD or carotid bruits. No lymphadenopathy.   LUNGS: Moving air bilaterally. No wheeze or crackles. No use of accessory muscles for breathing.   CARDIOVASCULAR: S1, S2 regular rate and rhythm. 3/6 systolic murmur.  No rubs or gallops.   ABDOMEN: Soft, nontender, nondistended. No hepatosplenomegaly. Normal bowel sounds.   EXTREMITIES: No pedal edema. No clubbing or cyanosis. 2+ dorsalis pedis pulses palpable bilaterally. She does have deformed toes secondary to her rheumatoid arthritis.    SKIN: No acne, rash, or lesions.   LYMPHATICS: No cervical lymphadenopathy.   NEUROLOGIC: Cranial nerves appear intact. Able to move all four extremities. Limited lower extremity movement secondary to pain.   PSYCH: She is awake, alert, oriented x3.   LABORATORY, RADIOLOGICAL AND DIAGNOSTIC DATA: WBC 13.7, hemoglobin 12.3, hematocrit 36.9, platelet count 184.   Sodium 132, potassium 4.2, chloride 96, bicarbonate 29, BUN 14, creatinine 0.49, glucose 159, calcium 8.7.   CK total 224. CT of the head without contrast showing no acute intracranial process, chronic small vessel ischemic disease is present. Pelvis x-ray showing comminuted fractures of superior/inferior pubic rami on the right side. Lumbar spine x-ray showing degenerative changes of the lumbar spine, dextroscoliosis is present. Right femur x-ray showing no acute bony abnormality of the right femur and right hip x-ray showing no evidence of any acute hip fracture. EKG showing sinus tachycardia, heart rate of 102. No acute ST-T wave abnormalities.   ASSESSMENT AND PLAN: 81 year old female from independent living fell last night and brought in for pubic ramus fracture.  1. Fall and right-sided pubic ramus fracture. Will admit for pain management and also physical therapy consult. Will get orthopedics consult. Likely conservative management will be recommended. Might need rehab.  2. Depression. Continue Celexa. 3. Hypertension. Continue losartan.  4. Rheumatoid arthritis. Continue sulfasalazine. 5. GI and deep vein thrombosis prophylaxis on Protonix and sub-Q heparin.  6. CODE STATUS: FULL CODE.   TIME SPENT ON ADMISSION: 50 minutes.    ____________________________ Enid Baasadhika Shantina Chronister, MD rk:cms D: 05/26/2012 15:12:40 ET T: 05/26/2012 15:50:54 ET JOB#: [id number]  cc: Enid Baasadhika Giovanne Nickolson, MD, <Dictator> Reola MosherAndrew S. Randa LynnLamb, MD Enid BaasADHIKA Keiko Myricks MD ELECTRONICALLY SIGNED 05/26/2012 19:00

## 2015-01-02 NOTE — Discharge Summary (Signed)
PATIENT NAME:  Holly Drake, Holly Drake MR#:  [id number] DATE OF BIRTH:  [birth year]  DATE OF ADMISSION:  05/28/2012 DATE OF DISCHARGE:  05/31/2012  ADMITTING PHYSICIAN: Enid Baasadhika Alon Mazor, MD DISCHARGING PHYSICIAN:  Enid Baasadhika Vanessia Bokhari, MD  PRIMARY CARE PHYSICIAN: Alonna BucklerAndrew Lamb, MD  CONSULTANTS: Juanell FairlyKevin Krasinski, MD - Rheumatology.  DISCHARGE DIAGNOSES:  1. Fall and right pubic rami fracture with conservative management.  2. Depression.  3. Hypertension.  4. Rheumatoid arthritis.  5. Macular degeneration.  6. Left lower lobe pneumonia.  7. Constipation. 8. One blood culture positive for coagulase-negative Staphylococcus, which is a contaminant.  DISCHARGE MEDICATIONS:  1. Caltrate 600 mg p.o. daily.  2. Ginger oil capsule 1 capsule daily.  3. Cranberry oral capsule 1 tablet p.o. daily.  4. Tylenol Extra-Strength 500 mg/25 mg one tablet at bedtime as needed.  5. Sulfasalazine 500 mg p.o. twice a day. 6. Celexa 20 mg p.o. daily.  7. Losartan 100 mg p.o. daily.  8. Metoprolol 50 mg p.o. twice a day. 9. Levaquin 250 mg p.o. daily for four more days.  10. Acetaminophen/hydrocodone 325 mg/5 mg one tablet every six hours as needed for pain.   DISCHARGE OXYGEN: 2 liters.   DISCHARGE DIET: Low-sodium diet.   DISCHARGE ACTIVITY: As tolerated.   FOLLOWUP INSTRUCTIONS:  1. Orthopedic follow up with Dr. Juanell FairlyKevin Krasinski in 1 to 2 weeks.  2. Primary care physician follow-up in 1 to 2 weeks.  3. Physical therapy.   LABORATORY, DIAGNOSTIC AND RADIOLOGIC DATA: WBC 11.4, hemoglobin 10.9, hematocrit 32.9, and platelet count 173.  Sodium 131, potassium 4.3, chloride 95, bicarbonate 31, BUN 18, creatinine 0.78, glucose 144, and calcium 8.3.   Urinalysis is negative for any infection.   Right hip x-ray is showing no evidence of acute hip fracture.  Pelvic x-ray is showing sustained comminuted fractures of the superior and inferior pubic rami on the right side.  Blood cultures, one set from 05/28/2012, is  negative. The second set is growing coagulase-negative staph, likely contaminant.   BRIEF HOSPITAL COURSE: Ms. Holly Drake is an 81 year old pleasant Caucasian female with past medical history significant for hypertension, depression, rheumatoid arthritis and macular degeneration who [facility] who was brought in after she had a fall and sustained superior and inferior right pubic ramal fractures.  1. Superior/inferior right pubic rami fracture. She was seen by orthopedics who recommended conservative management. She did work with physical therapy who recommended rehab at this time. The patient is also requiring pain medications as needed. 2. Hypertension. Blood pressure was elevated while in the hospital possibly from anxiety and pain, however, it was not controlled just with p.r.n. doses so her losartan had to be increased to 100 mg and metoprolol was added 50 mg p.o. twice a day and that needs to be readjusted if needed when checked in her primary care physician's office.  3. Left lower lobe pneumonia. The patient was persistently hypoxic while in the hospital and had some periods of difficulty breathing. Chest x-ray revealed left lower lobe infiltrate versus atelectasis. Incentive spirometry has been encouraged and she was started on Levaquin with improvement. Right now she is requiring 1 to 2 liters of oxygen, mostly on exertion, which we will continue and wean as tolerated.  4. Rheumatoid arthritis. She is on sulfasalazine twice a day. Continue that. No changes were made to her other medications.   Her course has been otherwise uneventful in the hospital.   DISCHARGE CONDITION: Stable.   DISCHARGE DISPOSITION: Short-term rehab.  CODE STATUS: FULL CODE.   TIME SPENT ON DISCHARGE: 45 minutes. ____________________________ Enid Baas, MD rk:slb D: 05/31/2012 12:59:30 ET T: 05/31/2012 13:12:07 ET JOB#: [id number]  cc: Enid Baas, MD,  <Dictator> Kathreen Devoid, MD Reola Mosher. Randa Lynn, MD Enid Baas MD ELECTRONICALLY SIGNED 06/01/2012 17:08

## 2015-01-05 NOTE — Consult Note (Signed)
PATIENT NAME:  Kyra SearlesKEITH, Pattye E MR#:  [id number] DATE OF BIRTH:  19-May-1924  DATE OF CONSULTATION:  02/27/2013  REFERRING PHYSICIAN:   CONSULTING PHYSICIAN:  Knute Neuobert G. Lorre NickGittin, MD  Mrs. [name] is an 81 year old patient, who was admitted [facility] after sustaining a fall. She was lying on her back and could not give a good history, she was confused, which was felt to be at baseline and she came into the Emergency Room. She was hypoxic and a CT of the chest was done that shows a persistent abnormal lesion that was noted back in April; right upper lobe spiculated lesion for which follow up had been planned. Also, left lower lobe or possibly bilateral pneumonia. The patient was admitted and started on Solu-Medrol and also given Lasix. An echocardiogram was done and extremity edema was noted.   DICTATION ENDS HERE ____________________________ Knute Neuobert G. Lorre NickGittin, MD rgg:aw D: 02/27/2013 20:56:00 ET T: 02/28/2013 06:12:15 ET JOB#: [id number]  cc: Knute Neuobert G. Lorre NickGittin, MD, <Dictator> Marin RobertsOBERT G GITTIN MD ELECTRONICALLY SIGNED 03/04/2013 14:05

## 2015-01-05 NOTE — Op Note (Signed)
PATIENT NAME:  Holly SearlesKEITH, [name] MR#:  [id number] DATE OF BIRTH:  1924/01/13  DATE OF PROCEDURE:  02/28/2013  PREOPERATIVE DIAGNOSES:  1.  Left lower extremity deep venous thrombosis.  2.  Dementia. 3.  Fall risk making her a poor candidate for chronic anticoagulation.  4.  Hypertension.  5.  Lung mass.   POSTOPERATIVE DIAGNOSES: 1.  Left lower extremity deep venous thrombosis.  2.  Dementia. 3.  Fall risk making her a poor candidate for chronic anticoagulation.  4.  Hypertension.  5.  Lung mass.   PROCEDURES: Placement of inferior vena cava filter.   SURGEON: Annice NeedyJason S Dew, M.D.   ANESTHESIA: Local.   ESTIMATED BLOOD LOSS: Minimal.   FLUOROSCOPY TIME: 1 minute.   CONTRAST USED: 15 mL.   INDICATION FOR PROCEDURE: An 81 year old white female, who was admitted with respiratory insufficiency. She has a possible lung mass and possible pneumonia. She was also found to have a left lower extremity DVT. She has reasonably advanced dementia and is a very high fall risk and is not felt to be a candidate for chronic anticoagulation. A short-term anticoagulation may be performed, but we are still asked to place an IVC filter for more definitive treatment to avoid thromboembolic complications.   DESCRIPTION OF PROCEDURE: The patient was brought to the vascular and interventional radiology suite. Groins were shaved and prepped and a sterile surgical field was created. The right femoral vein was visualized with ultrasound and found to be widely patent. It was then accessed under direct ultrasound guidance without difficulty with a Seldinger needle. A J-wire was then placed. After skin nick and dilatation, the delivery sheath was placed into the inferior vena cava. Inferior venacavogram was performed showing the level of the renal veins without L2 and the filter was deployed below the level of the renal veins, the left being lower. The delivery system was removed. Pressure was held. Sterile dressing was  placed. The patient tolerated the procedure well and was taken to the recovery room in stable condition.   ____________________________ Annice NeedyJason S. Dew, MD jsd:aw D: 02/28/2013 18:08:46 ET T: 03/01/2013 06:47:50 ET JOB#: [id number]  cc: Annice NeedyJason S. Dew, MD, <Dictator> Annice NeedyJASON S DEW MD ELECTRONICALLY SIGNED 03/10/2013 14:41

## 2015-01-05 NOTE — Op Note (Signed)
PATIENT NAME:  Holly Drake, Nashla MR#:  [id number] DATE OF BIRTH:  [birth year]  DATE OF PROCEDURE:  12/28/2012  PREOPERATIVE DIAGNOSIS: Right peritonsillar abscess.  POSTOPERATIVE DIAGNOSIS:  Right peritonsillar abscess.    SURGEON: Davina Pokehapman T. Ivyrose Hashman, M.D.   OPERATION PERFORMED:  Excision and drainage, right peritonsillar abscess.   OPERATIVE FINDINGS: Approximately 40 to 50 mL of pus in the right peritonsillar space, extending into the upper neck.   DESCRIPTION OF PROCEDURE: The patient was a taken from room 136 to the operating room where she was placed in supine position. Care was taken, particularly in the right arm from her previous olecranon fracture. Once on the operating room table, general anesthesia was administered. The patient was intubated with Ray tube. The table was turned 90 degrees. Examination of the oropharynx showed what appeared to be an obvious peritonsillar abscess. My physical exam plus the CT findings, which were in the operating room. An 18-gauge needle was used to probe the peritonsillar region. There was immediate pus, which was encountered. This was sent for culture. A #15 blade was then used to make an incision on the right side in the peritonsillar plane along the lateral edge of the tonsil. Immediately, a large pus pocket was encountered. This was opened widely with a curved hemostat. Manipulation of the right submandibular and upper neck triangle were massaged and this obvious pus welled up from  the hole. This was then suctioned free. There was a tract that extended in the right peritonsillar region down into the upper neck. This was then gently probed with the suction and there were several loculations, which were opened, allowing this parapharyngeal space to drain. With this opened, manipulation and massage of the right neck showed no further purulence. Saline solution mixed with a Betadine solution was then used to thoroughly flush this right parapharyngeal region.  Approximately 80 to 90 mL was used to irrigate this area out. Upon release of the pus, immediately relieved peritonsillar tension and open the airway significantly. With this completed, the patient was then returned to anesthesia where she was taken to the ICU remaining intubated until further awakened.     CULTURES: Right parapharyngeal space.   SPECIMENS: None.   ESTIMATED BLOOD LOSS: Less than 20 Ml.    ____________________________ Davina Pokehapman T. Shacoria Latif, MD ctm:cc D: 12/28/2012 21:02:15 ET T: 12/28/2012 21:35:10 ET JOB#: [id number]  cc: Davina Pokehapman T. Manju Kulkarni, MD, <Dictator> Davina PokeHAPMAN T Avyn Coate MD ELECTRONICALLY SIGNED 01/14/2013 7:55

## 2015-01-05 NOTE — Discharge Summary (Signed)
PATIENT NAME:  Holly Drake, Holly Drake MR#:  [id number] DATE OF BIRTH:  Jan 19, 1924  DATE OF ADMISSION:  02/27/2013 DATE OF DISCHARGE:  03/01/2013  ADMITTING DIAGNOSES:  1.  Chronic obstructive pulmonary disease exacerbation.  2.  Pneumonia.  3.  Lower extremity edema.   DISCHARGE DIAGNOSES: 1.  Chronic obstructive pulmonary disease exacerbation. 2.  Bacterial pneumonia, unclear etiology. No sputum obtained.  3.  Left lower extremity deep vein thrombosis, status post inferior vena cava filter placement on 02/28/2013 by Dr. Wyn Quaker.  4.  Right upper lobe pulmonary nodule, concerning for cancer; however, no further work-up was wished by family.  5.  Hypertension.  6.  Generalized weakness, falls.  7.  Dementia. (Dictation Anomaly) Depression  8.  Rheumatoid arthritis. 9.  Osteoporosis. 10.  History of rib fractures.   CONSULTANTS: Dr. Lorre Nick, Dr. Wyn Quaker.   PROCEDURES: IVC filter placement on 02/28/2013 by Dr. Wyn Quaker.   RADIOLOGIC STUDIES: Chest PA and lateral 02/26/2013 showed COPD, persistent fullness of hilar structures bilaterally, chronic changes in the right upper lobe and chest wall. Small pleural effusions were also noted. CT scan of maxillofacial area without contrast 02/26/2013 due  to nose trauma revealed no acute osseous injury in maxillofacial bones. CT of chest for pulmonary embolism with IV contrast 02/26/2013 revealed bilateral patchy areas of increased density, worse on the left than on the right, concerning for bilateral pneumonia. The possibility of some atelectasis is not completely excluded. Stable spiculated pleural-based density in the right lung apex, which could represent underlying carcinoma. Fibrosis is not completely excluded. Stable calcified granuloma in the left lung base. Mild cardiomegaly. Stable enlargement of left thyroid lobe, dominantly cystic mass causing slight displacement of the trachea toward the right, though shows retrosternal extension into the superior mediastinum only  minimally. No thoracic aortic aneurysm or dissection. No pulmonary embolic filling defect. There is a thoracic scoliosis concave to the left. No acute bony abnormality was evident. Ultrasound of bilateral lower extremities 02/27/2013 showed thrombus in left superficial femoral vein. A trace amount of flow appears present. Otherwise unremarkable study. Echocardiogram 02/27/2013 revealed left ventricular ejection fraction by visual estimation is 70% to 75%, Normal global left ventricular systolic function, read by Dr. Juliann Pares.  HOSPITAL COURSE: The patient is an 81 year old Caucasian female with past medical history significant for history of dementia, who lives in the [facility] assisted living facility, was brought to the Emergency Room after a fall. Please refer to Dr. Rob Hickman admission note on 02/27/2013. She was unfortunately not able to provide any history. Her vital signs revealed a normal temperature of 98.1. Pulse was 60s. Respiration rate was in 20s, blood pressure 140/77 and pulse oximetry was 94% on oxygen therapy. Physical exam revealed lower extremity pitting edema. Lung exam was unremarkable. The patient's EKG showed sinus brady. CT scan revealed no evidence of pulmonary embolism; however, possible pneumonia versus atelectasis was noted. The patient's lab data showed mildly elevated B-type natriuretic peptide of 605, glucose of 121, sodium 134. Otherwise, BMP was unremarkable. The patient's liver enzymes revealed albumin level of 3.3, otherwise normal. Cardiac enzymes x 1 was normal. White blood cell count was normal at 6.7, hemoglobin was 11.7 and platelet count was 207. Absolute neutrophil count was normal at 4.9. EKG as mentioned above revealed by sinus brady, otherwise was unremarkable.   The patient was admitted to the hospital for further evaluation. Because of changes in her CT scan, she was initiated on antibiotic therapy, and COPD exacerbation was felt to be of concern. However,  the  patient had no significant phlegm production or cough. Since she had changes on her CT scan and some mild hypoxia, it was felt that the patient would benefit from antibiotic therapy as well as steroid tapering. She is to continue antibiotics for 7 more days to complete the course. She is to continue also steroid tapering and continue inhalation therapy. She was also felt to have some fluid retention and furosemide was initiated. However, it was felt that furosemide should be used as needed only and her labs should be checked very carefully because the patient is very frail and furosemide can cause significant dehydration in her. However, since the patient had fluid retention in her pleural space, it was felt that it would be beneficial to help her with breathing as well. It is recommended to check the patient's chest x-ray as outpatient in the next 1 week after discharge and make decisions about discontinuation of Lasix if needed. We did an echocardiogram, which was unremarkable.   In regards to lower extremity DVT, we consulted Dr. Wyn Quakerew and IVC filter was placed on 02/28/2013 by Dr. Wyn Quakerew due to the patient's inability to take anticoagulant medications due to risks of fall.   In regards to right upper lobe pulmonary nodule, we consulted Dr. Lorre NickGittin who, however, felt that the patient is very frail and would not benefit from further investigation. I discussed this situation with the patient's daughter and she was agreeable not to investigate at this time due to patient's frailty and unlikeliness that she would be able to go through chemotherapy.  In regards to hypertension and her chronic medical problems, the patient is to continue her outpatient management. Due to her generalized weakness, the patient underwent physical therapist evaluation, who recommended the patient to be returned back [facility] with physical therapy.   On day of discharge, the patient felt satisfactory and  did not complain of any significant shortness of breath or discomfort. Her vitals were stable with temperature of 97.7, pulse 60s to 70s. Respiratory rate was 20, blood pressure 116 to 130s systolic and 60s to 70s diastolic, and oxygen saturations were 88% on room air and 97% on 2 liters of oxygen through nasal cannula.   TIME SPENT: 40 minutes.   Of note, we also did additional studies, such as ferritin level was checked because of mild anemia, and ferritin level was found to be normal at 344. The patient's iron level was 95 and iron saturation was 37. It was felt that patient's anemia is anemia of chronic disease. Vitamin B12 level was also checked and was found to be 459, which was within normal limits.    ____________________________ Katharina Caperima Leib Elahi, MD rv:jm D: 03/01/2013 16:43:43 ET T: 03/01/2013 19:10:29 ET JOB#: [id number]  cc: Katharina Caperima Shafer Swamy, MD, <Dictator> Feliz Herard MD ELECTRONICALLY SIGNED 03/21/2013 12:32

## 2015-01-05 NOTE — Discharge Summary (Signed)
PATIENT NAME:  Holly Drake, Holly Drake MR#:  [id number] DATE OF BIRTH:  31-Aug-1924  DATE OF ADMISSION:  12/24/2012 DATE OF DISCHARGE:  01/02/2013  ADMITTING PHYSICIAN: Dr. Adrian Saran.   DISCHARGING PHYSICIAN: Dr. Enid Baas.   PRIMARY CARE PHYSICIAN: Dr. Alonna Buckler.   CONSULTATIONS IN THE HOSPITAL:  1. Pulmonary critical care consultation by Dr. Belia Heman.  2. ENT consultation by Dr. Linus Salmons.  3. Orthopedic consultations by Dr. Martha Clan and also Dr. Deeann Saint.   DISCHARGE DIAGNOSES:  1. Mechanical fall and right olecranon fracture, currently in a sling.  2. Fall in the hospital with superficial laceration on the head, otherwise neurologically stable.  3. Right upper lung density. Could be mass versus fibrosis versus pneumonia. Requiring 2 liters oxygen.  4. Dementia.  5. Malignant or accelerated hypertension.  6. Right peritonsillar abscess, status post drainage in the hospital.  7. Rheumatoid arthritis.  8. Depression.   DISCHARGE MEDICATIONS:  1. Advair Diskus 250/50 one puff b.i.d.  2. Caltrate 600 mg 1 tablet p.o. daily.  3. Celexa 20 mg p.o. at bedtime.  4. Losartan 100 mg p.o. daily.  5. Sulfasalazine 500 mg p.o. b.i.d.  6. Vitamin D3 2000 international units p.o. daily.  7. Norco 5/325 mg 1 to 2 tablets q.6 hours p.r.n. for pain.  8. Metoprolol 75 mg p.o. b.i.d.  9. Amlodipine 10 mg p.o. daily.  10. Amoxicillin 500 mg strength suspension every 8 hours until 01/10/2013.  11. Augmentin 600 mg strength suspension orally q.12 hours until 01/10/2013.  12. Protonix 40 mg p.o. daily.  13. Ensure 200 mL p.o. t.i.d.  14. Prednisone taper 50 mg p.o. daily and taper off by 10 mg every day.   DISCHARGE DIET: Continue low sodium, full liquid diet for the next 2 days and then soft diet for 3 days and then regular diet.   DISCHARGE ACTIVITY: As tolerated.   OXYGEN: Home oxygen 2 liters.    FOLLOWUP INSTRUCTIONS:  1. Follow up with Dr. Linus Salmons in 1 week for ENT  followup. Already appointment has been scheduled for April 28th at 9:30 a.m.  2. Followup appointment with orthopedics, Dr. Martha Clan. Followup appointment set up for April 29th at 3:00 p.m.  3. PCP followup in 2 weeks.  4. Physical therapy.  5. CT chest for followup of right upper lobe density in 8 weeks.   LABS AND IMAGING STUDIES: Sodium 136. Potassium 3.5 after placement. Chloride is 97, bicarb 35, BUN 11, creatinine 0.28, glucose 144 and calcium of 8.5. WBC is 12.7. CT of the chest done with contrast showing irregular-marginated density dural based in the right lung apex, could represent fibrosis or underlying malignancy.  Emphysematous lung changes with trace bilateral pleural effusions present and nodular density in left lower lobe laterally consistent with granulomatous disease. No pulmonary embolic filling defect. No thoracic cardiac aneurysm or dissection seen. Wound cultures from peritonsillar abscess drainage growing heavy growth Prevotella melaninogenica. Sensitivities are pending. Blood cultures are negative so far. CT of the head without contrast showing no acute intracranial process. Soft tissue swelling at the left frontal scalp. That was done after she had the fall. CT of the neck with contrast showed large mass, possibly necrosis, with adjacent adenopathy in the right neck extending from parapharyngeal region into the submandibular region with extensive involvement laterally. No involvement of the submandibular gland or parotid gland on the right can be established. Cystic mass with peripheral enhancement which may arise in the left thyroid lobe. Please correlate clinically. Nodular densities present  in right lung apex. Underlying malignancy or fibrosis cannot be excluded.   BRIEF HOSPITAL COURSE: The patient is an 81 year old, pleasant, elderly, Caucasian female with past medical history significant for dementia, rheumatoid arthritis, hypertension who is from [facility] assisted living  facility. Fell from the chair and suffered an elbow fracture. X-rays in the ED show right olecranon fracture, so she was admitted under medical service with ortho followup.  1. Mechanical fall and right-sided olecranon fracture: She was seen by Dr. Hyacinth Meeker initially. Recommended surgery. Because of her advanced dementia and age, she was a moderate risk for surgery. Subsequently, wanted a second opinion, and she was seen by Dr. Martha Clan. Her arm was changed over from cast to a soft sling for ease of use. The patient's pain is very well controlled with minimal pain medications, and she was actually able to use that arm to some extent, so there was no urgent indication for surgery and this is what the family wanted. They wanted to avoid surgery because of her age and her risk factors. Dr. Martha Clan recommended followup in the office in a couple of weeks, repeat x-ray and depending upon her ability to use the arm, he is going to decide whether she will need surgery or not.  2. Accelerated hypertension: The patient's blood pressure was elevated while in the hospital periodically. She is on losartan, and her metoprolol was increased while in the hospital to 75 mg b.i.d. and Norvasc has been added.  3. Right peritonsillar abscess: The patient did complain of sore throat when she came in. Had some oral thrush; however, she did not have the right jaw swelling which apparently worsened within a couple of days, a few days into hospitalization. One fine morning, the patient had tenderness and swelling along the jawline. Immediately, CT of the neck was ordered which showed possible peritonsillar abscess, so ENT consult was requested and she was seen by Dr. Jenne Campus who did I and D of her abscess. She was on Unasyn even before the I and D was done and that has been continued and changed over to double coverage with both Augmentin suspension and also amoxicillin suspension for a total of 10 days. She will finish off her  antibiotics in 01/10/2013. Postoperatively, she was intubated during the procedure and remained on the vent because of extensive swelling of her tissues and no air leak when the cuff was deflated. She remained on the vent for almost 48 hours, then extubated and remained on 2 liters nasal cannula. She was also on IV steroids while in the hospital and being changed over to p.o. prednisone tapering dose. She will follow up with Dr. Jenne Campus in the office in the next week. As recommended, she has been on clear liquid diet for 3 days and just changed over to full liquid diet which she will continue for another 3 days and then change over to soft diet before going to a regular diet. Her pain and swelling have improved now.  4. Mechanical fall while in the hospital: The patient was trying to get out of bed and even before the nurse came to help her, she was found on the floor. Hit her head. There was no loss of consciousness. Neurologically, she did not have any focal deficits. CT of the head showed only frontal scalp hematoma on the left side, and there was a laceration in the superior superficial vertex of the head which stopped bleeding spontaneously. She has remained stable neurologically without any changes. She has  been on fall precautions and working with physical therapy since then.  5. Acute respiratory failure requiring oxygen: According to daughter, the patient has been having respiratory issues over the past few months and was diagnosed to have COPD, started on Advair inhaler. She was using nebs p.r.n. and a prednisone taper as needed in between but no prior CT chest was done. Postextubation, the patient was found to be more tachypneic, so chest x-ray revealed right upper lobe density, which could be fibrosis, with COPD changes, and CT of the chest was done to rule out PE, especially with her fracture and not being on any Lovenox because Lovenox was held after she had the fall and had head injury and  laceration with bleeding. On the CT chest, there was no PE but there was a right upper lobe density/fibrosis which could be malignancy. The findings were discussed with the patient's daughter, Ms. Sharla Kidneyheresa Smith, and were also discussed with Dr. Belia HemanKasa who spoke with the family as well. The right lung would be difficult to access using a bronchoscope so either a CT-guided biopsy is recommended if the family was interested in diagnosis or we can wait and watch and repeat another CT in 2 to 3 weeks. If it were pneumonia, it would clear up. Fibrosis would not change. If it was increasing in size, then possible biopsy could be attempted depending upon what the prognosis is or how aggressive the family wanted to be at that time. The family decided to go conservatively, so she will need a CT followup in 8 weeks from now.  The patient remains on 2 liters oxygen at this time.  6. Dementia: She is at baseline, pleasant. She is conversable, identifies family members. Some days, she is agitated and more confused than the other days.  7. Depression: The patient is on Celebrex at this time.   CODE STATUS: DO NOT RESUSCITATE based on admission note.   ADDITIONAL TIME SPENT: 35 minutes.   DISCHARGE DISPOSITION: To short-term rehab. Family has chosen [facility].   DISCHARGE CONDITION: Stable with guarded prognosis.   ____________________________ Enid Baasadhika Joss Friedel, MD [id number] D: 01/02/2013 17:37:45 ET T: 01/02/2013 21:37:22 ET JOB#: [id number]  cc: Enid Baasadhika Shavona Gunderman, MD, <Dictator> Reola MosherAndrew S. Randa LynnLamb, MD Enid BaasADHIKA Nkechi Linehan MD ELECTRONICALLY SIGNED 01/13/2013 15:37

## 2015-01-05 NOTE — Consult Note (Signed)
CHIEF COMPLAINT and HISTORY:  Subjective/Chief Complaint DVT, dementia   History of Present Illness Patient with denentia, possible left lung mass, COPD.  Admitted with worsening SOB.  Had left leg swelling and pain and found to have DVT.  Dementia present with fall risk, and she is not felt to be a candidate for long term anticoagulation   PAST MEDICAL/SURGICAL HISTORY:  Past Medical History:   hyponatremia:    accelerated htn:    Depression:    Rheumatoid Arthritis:    Osteoporosis:    Dementia:    pelvic fracture:   ALLERGIES:  Allergies:  No Known Allergies:   HOME MEDICATIONS:  Home Medications: Medication Instructions Status  pantoprazole 40 mg oral delayed release tablet 1 tab(s) orally once a day Active  metoprolol tartrate 25 mg oral tablet 3 tab(s) orally every 12 hours Active  amLODIPine 10 mg oral tablet 1 tab(s) orally once a day Active  Advair Diskus 250 mcg-50 mcg inhalation powder 1 puff(s) inhaled 2 times a day (8 am, 8 pm) Active  Caltrate 600 mg oral tablet 1 tab(s) orally once a day (8 am) Active  citalopram 20 mg oral tablet 1 tab(s) orally once a day (at bedtime) (8 pm) Active  losartan 100 mg oral tablet 1 tab(s) orally once a day (8 am) Active  sulfasalazine 500 mg oral tablet 1 tab(s) orally 2 times a day (8 am, 5 pm) Active  Vitamin D3 2000 intl units oral tablet 1 tab(s) orally once a day (8 am) Active  Norco 5 mg-325 mg oral tablet 1-2 tab(s) orally every 6 hours, As Needed- for Pain  Active   Family and Social History:  Family History Non-Contributory   Social History negative tobacco, negative ETOH   Place of Living Nursing Home   Review of Systems:  Subjective/Chief Complaint SOB   Fever/Chills No   Cough Yes   Sputum No   Abdominal Pain No   Diarrhea No   Constipation No   Nausea/Vomiting No   SOB/DOE Yes   Chest Pain Yes   Dysuria No   Tolerating PT No   Tolerating Diet Yes   Medications/Allergies Reviewed  Medications/Allergies reviewed   Physical Exam:  GEN well developed, well nourished   HEENT pink conjunctivae, moist oral mucosa   NECK No masses  trachea midline   RESP normal resp effort  no use of accessory muscles   CARD irregular rate  no JVD   ABD denies tenderness  normal BS   LYMPH negative neck, negative axillae   EXTR negative cyanosis/clubbing, positive edema   SKIN No ulcers, skin turgor good   NEURO motor/sensory function intact   PSYCH poor insight   LABS:  Laboratory Results: LabObservation:    15-Jun-14 10:17, Echo Doppler  OBSERVATION   Reason for Test  Hepatic:    14-Jun-14 20:45, Comprehensive Metabolic Panel  Bilirubin, Total 0.3  Alkaline Phosphatase 99  SGPT (ALT) 19  SGOT (AST) 29  Total Protein, Serum 6.7  Albumin, Serum 3.3  Cardiology:    14-Jun-14 20:12, ED ECG  Ventricular Rate 59  Atrial Rate 59  P-R Interval 132  QRS Duration 76  QT 464  QTc 459  P Axis 65  R Axis 0  T Axis 56  ECG interpretation   Sinus bradycardia  Otherwise normal ECG  When compared with ECG of 23-Dec-2012 15:57,  Right bundle branch block is no longer Present  ----------unconfirmed----------  Confirmed by OVERREAD, NOT ([id number]), editor PEARSON, BARBARA ([id number]) on  02/28/2013 9:46:17 AM  ED ECG     15-Jun-14 10:17, Echo Doppler  Echo Doppler   REASON FOR EXAM:      COMMENTS:       PROCEDURE: [place] - ECHO DOPPLER COMPLETE(TRANSTHOR)  - Feb 27 2013 10:17AM     RESULT: Echocardiogram Report    Patient Name:   Holly Drake Date of Exam: 02/27/2013  Medical Rec #:  [id number]        Custom1:  Date ofBirth:  07-18-1924      Height:       66.0 in  Patient Age:    81 years      Weight:       120.0 lb  Patient Gender: F             BSA:          1.61 m??    Indications: SOB  Sonographer:    Janalee Dane RCS  Referring Phys: Nicholes Mango    Sonographer Comments: [facility]    Summary:   1. Left ventricular ejection fraction, by visual estimation, is 70 to   75%.    2. Normal global left ventricular systolic function.  2D AND M-MODE MEASUREMENTS (normal ranges within parentheses):  Left Ventricle: Normal  IVSd (2D):      1.18 cm (0.7-1.1)  LVPWd (2D):     0.98 cm (0.7-1.1) Aorta/LA:                  Normal  LVIDd (2D):     3.60 cm (3.4-5.7) Aortic Root (2D): 3.00 cm (2.4-3.7)  LVIDs (2D):     2.11 cm           Left Atrium (2D): 3.90 cm (1.9-4.0)  LV FS (2D):     41.4 %   (>25%)  LV EF (2D):     73.2 %   (>50%)                                    Right Ventricle:                                    RVd (2D):  LV DIASTOLIC FUNCTION:  MV Peak E: 0.69 m/s E/e' Ratio: 7.60  MV Peak A: 0.86 m/s Decel Time: 303 msec  E/A Ratio: 0.80  SPECTRAL DOPPLER ANALYSIS (where applicable):  Mitral Valve:  MV P1/2 Time: 87.87 msec  MV Area, PHT: 2.50 cm??  Tricuspid Valve and PA/RV Systolic Pressure: TR Max Velocity: 2.63 m/s RA   Pressure: 5 mmHg RVSP/PASP: 32.7 mmHg    PHYSICIAN INTERPRETATION:  Left Ventricle: The left ventricular internal cavity size was normal. LV   septal wall thickness was normal. LV posterior wall thickness was normal.   Global LV systolic function was normal. Left ventricular ejection   fraction, by visual estimation, is 70 to 75%.  Right Ventricle: The right ventricular size is normal. Global RV systolic   function is normal.  Left Atrium: The left atrium is normal in size.  Right Atrium: The right atrium is normal in size.  Pericardium: There is no evidence of pericardial effusion.  Mitral Valve: The mitral valve is normal in structure. Trace mitral valve   regurgitation is seen.  Tricuspid Valve: The tricuspid valve is normal. Trivial tricuspid   regurgitation is visualized. The tricuspid regurgitant  velocity is 2.63   m/s, and with an assumed right atrial pressure of 5 mmHg, the estimated   right ventricular systolic pressure is normal at 32.7 mmHg.  Aortic Valve: The aortic valve is normal. The aortic valve is  structurally  normal, with no evidence of sclerosis or stenosis.  Pulmonic Valve: The pulmonic valve is normal.    [place] MD  Electronically signed by [place] MD  Signature Date/Time: 02/27/2013/9:11:45 PM    *** Final ***    IMPRESSION: .        Verified By: Yolonda Kida, M.D., MD  Routine Chem:    14-Jun-14 20:45, B-Type Natriuretic Peptide [facility])  B-Type Natriuretic Peptide [facility]) 605  Result(s) reported on 26 Feb 2013 at 09:43PM.    14-Jun-14 20:45, Comprehensive Metabolic Panel  Glucose, Serum 121  BUN 15  Creatinine (comp) 0.59  Sodium, Serum 134  Potassium, Serum 3.9  Chloride, Serum 97  CO2, Serum 35  Calcium (Total), Serum 9.1  Osmolality (calc) 270  eGFR (African American) >60  eGFR (Non-African American) >60  eGFR values <24m/min/1.73 m2 may be an indication of chronic  kidney disease (CKD).  Calculated eGFR is useful in patients with stable renal function.  The eGFR calculation will not be reliable in acutely ill patients  when serum creatinine is changing rapidly. It is not useful in   patients on dialysis. The eGFR calculation may not be applicable  to patients at the low and high extremes of body sizes, pregnant  women, and vegetarians.  Anion Gap 2    15-Jun-14 016:07 Basic Metabolic Panel (w/Total Calcium)  Glucose, Serum 173  BUN 11  Creatinine (comp) 0.59  Sodium, Serum 136  Potassium, Serum 4.1  Chloride, Serum 99  CO2, Serum 31  Calcium (Total), Serum 8.9  Anion Gap 6  Osmolality (calc) 275  eGFR (African American) >60  eGFR (Non-African American) >60  eGFR values <615mmin/1.73 m2 may be an indication of chronic  kidney disease (CKD).  Calculated eGFR is useful in patients with stable renal function.  The eGFR calculation will not be reliable in acutely ill patients  when serum creatinine is changing rapidly. It is not useful in   patients on dialysis. The eGFR calculation may not be applicable  to patients at the low  and high extremes of body sizes, pregnant  women, and vegetarians.    16-Jun-14 11:10, Ferritin ([facility])  Ferritin ([facility]  Result(s) reported on 28 Feb 2013 at 11:47AM.    16-Jun-14 11:10, Iron and IBC ([facility] Binding Capacity (TIBC) 257  Unbound Iron Binding Capacity 162  Iron, Serum 95  Iron Saturation 37  Result(s) reported on 28 Feb 2013 at 12:24PM.  Cardiac:    14-Jun-14 20:45, Cardiac Panel  CK, Total 44  CPK-MB, Serum 2.1  Result(s) reported on 26 Feb 2013 at 09:43PM.    14-Jun-14 20:45, Troponin I  Troponin I < 0.02  0.00-0.05  0.05 ng/mL or less: NEGATIVE   Repeat testing in 3-6 hrs   if clinically indicated.  >0.05 ng/mL: POTENTIAL   MYOCARDIAL INJURY. Repeat   testing in 3-6 hrs if   clinically indicated.  NOTE: An increase or decrease   of 30% or more on serial   testing suggests a   clinically important change  Routine Hem:    14-Jun-14 20:45, CBC Profile  WBC (CBC) 6.7  RBC (CBC) 3.89  Hemoglobin (CBC) 11.7  Hematocrit (CBC) 34.8  Platelet Count (CBC) 207  MCV 90  MCH 30.2  MCHC 33.7  RDW 15.8  Neutrophil % 72.9  Lymphocyte % 13.7  Monocyte % 10.8  Eosinophil % 1.4  Basophil % 1.2  Neutrophil # 4.9  Lymphocyte # 0.9  Monocyte # 0.7  Eosinophil # 0.1  Basophil # 0.1  Result(s) reported on 26 Feb 2013 at 09:20PM.    15-Jun-14 04:35, CBC Profile  WBC (CBC) 5.5  RBC (CBC) 3.74  Hemoglobin (CBC) 11.3  Hematocrit (CBC) 33.2  Platelet Count (CBC) 201  MCV 89  MCH 30.2  MCHC 34.0  RDW 15.9  Neutrophil % 89.0  Lymphocyte % 8.9  Monocyte % 1.9  Eosinophil % 0.1  Basophil % 0.1  Neutrophil # 4.9  Lymphocyte # 0.5  Monocyte # 0.1  Eosinophil # 0.0  Basophil # 0.0  Result(s) reported on 27 Feb 2013 at 05:51AM.   RADIOLOGY:  Radiology Results: XRay:    11-Sep-13 11:45, Pelvis AP Only  Pelvis AP Only  REASON FOR EXAM:    fall, right hip pain  COMMENTS:       PROCEDURE: DXR - DXR PELVIS AP ONLY  - May 26 2012 11:45AM     RESULT:  The bony pelvis is osteopenic. There are comminuted fractures of   the right superior and inferior pubic rami in the parasymphyseal regions.   The hips appear intact though there is degenerative joint space   narrowing. The observed portions of the sacrum appear normal.    IMPRESSION:  The patient sustained comminuted fractures of the superior   and inferior pubic rami on the right.     Dictation Site: 2      Verified By: DAVID A. Martinique, M.D., MD    11-Sep-13 11:50, Lumbar Spine AP and Lateral  Lumbar Spine AP and Lateral  REASON FOR EXAM:    fall, low back and right hip pain  COMMENTS:       PROCEDURE: DXR - DXR LUMBAR SPINE AP AND LATERAL  - May 26 2012 11:50AM     RESULT: The lumbar vertebral bodies are preserved in height. There is   curvature of the lumbar spine with the convexity toward the right. There   is degenerative disc change at multiple levels. The pedicles are grossly   intact. As best as can be determined the spinous processes are intact.    IMPRESSION:  There is degenerative change of the lumbar spine. There is   dextroscoliosis.     Dictation Site: 2      Verified By: DAVID A. Martinique, M.D., MD    11-Sep-13 12:01, Femur Right  Femur Right  REASON FOR EXAM:    fall, right jhip pain  COMMENTS:       PROCEDURE: DXR - DXR FEMUR RIGHT  - May 26 2012 12:01PM     RESULT: Four views of the right femur are submitted. The bone is   osteopenic. The observed portions of the hip and knee appear intact. The   overlying soft tissues are normal in appearance. No shaft fracture of the   femur is seen.    IMPRESSION:  There is no acute bony abnormality of the right femur.     Dictation Site: 2        Verified By: DAVID A. Martinique, M.D., MD    11-Sep-13 12:01, Hip Right Complete  Hip Right Complete  REASON FOR EXAM:    fall, right hip pain  COMMENTS:       PROCEDURE: DXR - DXR  HIP RIGHT COMPLETE  - May 26 2012 12:01PM     RESULT: AP and lateral views of the  right femur reveal no acute fracture.   There is diffuse osteopenia. There is moderate degenerative joint space   narrowing. The patient has known fractures of the superior and inferior   pubic rami on the right.    IMPRESSION:  There is no evidence of an acute hip fracture.     Dictation Site: 2        Verified By: DAVID A. Martinique, M.D.,MD    13-Sep-13 13:10, Chest Portable Single View  Chest Portable Single View  REASON FOR EXAM:    hypoxia  COMMENTS:       PROCEDURE: DXR - DXR PORTABLE CHEST SINGLE VIEW  - May 28 2012  1:10PM     RESULT: Comparison: None    Findings:     Single portable AP chest radiograph is provided. There is left lower lobe   airspace disease. There trace bilateral pleural effusions. There is a   small 6 mm calcified nodule in the left lower lobe. Normal   cardiomediastinal silhouette. The osseous structures are unremarkable.    IMPRESSION:   Left lower lobe airspace disease concerning for atelectasis versus   infiltrate. There are trace bilateral pleural effusions.    Dictation Site: 1          Verified By: Jennette Banker, M.D., MD    [phone] 20:18, Pelvis AP Only  Pelvis AP Only  REASON FOR EXAM:    fall, pelvis pain  COMMENTS:       PROCEDURE: DXR - DXR PELVIS AP ONLY  - Jul 19 2012  8:18PM     RESULT: The bony pelvis is osteopenic. There are degenerative changes of   both hips. There is deformity of the right superior and inferior pubic   rami. There is evidence of bony remodeling around the previously   demonstrated comminuted fracture of the pubic bones on May 26, 2012. The left hemipelvis appears intact. As best as can be determined   the sacrum is intact. There is a lordosis associated with the SI joints.    IMPRESSION:  There is no evidence of an acute fracture of the pelvis in   this patient with on- going healing of medial right superior and inferior   pubic ramus fractures.   Dictation Site: 5          Verified  By: DAVID A. Martinique, M.D., MD    10-Apr-14 10:13, Elbow Right Complete  Elbow Right Complete  REASON FOR EXAM:    fall, bruising noted  COMMENTS:   LMP: Post-Menopausal    PROCEDURE: DXR - DXR ELBOW RT COMP W/OBLIQUES  - Dec 23 2012 10:13AM     RESULT: History: Trauma.    Comparison Study: None.    Findings: Severely displaced fracture with comminution present of the   olecranon process. Bony density noted adjacent to the radial epicondyle   distal humerus. This appears corticated ,most likely old. Radial head   appears to be intact.    IMPRESSION:  Comminuted severely displaced olecranonprocess fracture.    Verified By: Osa Craver, M.D., MD    10-Apr-14 11:07, Chest PA and Lateral  Chest PA and Lateral  REASON FOR EXAM:    fall  COMMENTS:       PROCEDURE: DXR - DXR CHEST PA (OR AP) AND LATERAL  - Dec 23 2012 11:07AM  RESULT: Comparison made to prior study of 05/28/2012. Widening of the   mediastinum is present and stable. This is consistent with goiter.   Trachea is displaced to the right. Lungs are clear. Cardiomegaly. No CHF.   No pneumothorax. Calcified nodule left lung base consistent with   granuloma. Old right rib fractures present. Degenerative changes both   shoulders and thoracic spine. Diffuse osteopenia.    IMPRESSION: : No acute abnormality.      Verified By: Osa Craver, M.D., MD    10-Apr-14 11:07, Forearm Right  Forearm Right  REASON FOR EXAM:    fx  COMMENTS:       PROCEDURE: DXR - DXR FOREARM RIGHT  - Dec 23 2012 11:07AM     RESULT: History: Fall.    Comparison Study: No recent.    Findings: Severely displaced comminuted fracture of the olecranon process   is present. Remainder of the forearm is intact. Degenerative changes are   present about the wrist. Left wrist series is suggested for further   evaluation to excludefracture involving the wrist.    IMPRESSION:    1. Severely displaced olecranon fracture.  2. Forearm is  intact.  3. Severe degenerative changes  present about the wrist. Right wrist   series is suggested to exclude fracture.        Verified By: Osa Craver, M.D., MD    10-Apr-14 11:07, Humerus Right  Humerus Right  REASON FOR EXAM:    olecranon fracture  COMMENTS:       PROCEDURE: DXR - DXR HUMERUS RIGHT  - Dec 23 2012 11:07AM     RESULT: Severely displaced olecranon fracture present. The humerus is   intact.    IMPRESSION:  Humerus is intact. Severely displaced olecranon process   fracture.        Verified By: Osa Craver, M.D., MD    15-Apr-14 08:01, Chest PA and Lateral  Chest PA and Lateral  REASON FOR EXAM:    cough, hypoxia  COMMENTS:       PROCEDURE: DXR - DXR CHEST PA (OR AP) AND LATERAL  - Dec 28 2012  8:01AM     RESULT: Comparison: 12/23/2012, 05/28/2012    Findings:  The heart and mediastinum are stable. 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   Small nodular density overlying the left lower lung is similar to prior   and likely calcified given its density to small size. Otherwise, no focal   pulmonary opacities. There are multiple old right posterior lateral rib   fractures.  IMPRESSION:   1. No acute cardiopulmonary disease.  2. There are findings in the superior mediastinum which likely represent   substernal extension of a thyroid goiter. However, further evaluation   could be provided with chest CT, as indicated.    Dictation site: 2        Verified By: Gregor Hams, M.D., MD    15-Apr-14 21:54, Chest Portable Single View  Chest Portable Single View  REASON FOR EXAM:    post intubation  COMMENTS:       PROCEDURE: DXR - DXR PORTABLE CHEST SINGLE VIEW  - Dec 28 2012  9:54PM     RESULT: Comparison is made to the study of 28 December 2012 at 8:02 a.m. The   heart is nonenlarged. There is  atheroscleroticcalcification present  within the aortic arch. Cardiac monitoring electrodes are present. There   is pleural thickening present of the right chest laterally. There appear   to be areas of possible old rib fractures and some associated pleural   change. There is apical pleural thickening bilaterally. There is nodular   density in the lateral inferior left lung likely calcified consistent   with granulomatous disease. A trace left pleural effusion is not   excluded. There is soft tissue fullness in the superior mediastinum. The   possibility of thyroid mass or border could be considered. There appears     to be endotracheal tube present with the distal tip at the level of the   inferior aortic arch margin above the level of the carina.    IMPRESSION:  Interval intubation as described. A otherwise, no acute or   significant other interval change.    Dictation Site: 1        Verified By: Sundra Aland, M.D., MD    18-Apr-14 10:28, Chest Portable Single View  Chest Portable Single View  REASON FOR EXAM:    tachypnea  COMMENTS:       PROCEDURE: DXR - DXR PORTABLE CHEST SINGLE VIEW  - Dec 31 2012 10:28AM     RESULT: History: Shortness of breath.    Comparison Study: Prior chest x-ray 12/28/2012.    Findings: Mild chronic left pontine angle suggesting small effusion. Mild   right hilar fullness noted. Adenopathy cannot be excluded. Chest CT   should be considered for further evaluation. Mild atelectasis left lung   base. Mild infiltrate cannot be excluded. Old right rib fractures. Mild   prominence of the superior mediastinum. This could be vascular however   again chest CT suggest for further evaluation.  IMPRESSION:   1. Wall fullness of the superior mediastinum and in right hilar. To   evaluate for adenopathy CT suggested.  2. Mild atelectasis and/or infiltrate left lung base with small pleural   effusion.        Verified By: Osa Craver, M.D., MD     14-Jun-14 20:27, Chest PA and Lateral  Chest PA and Lateral  REASON FOR EXAM:    fall and hypoxia  COMMENTS:       PROCEDURE: DXR - DXR CHEST PA (OR AP) AND LATERAL  - Feb 26 2013  8:27PM     RESULT: Comparison is made to the previous study of 12/31/2012.    The lungs are hyperinflated consistent with COPD. Small bilateral   effusions are present. Heart is borderline to mildly enlarged.   Atherosclerotic calcification is present within the aortic arch. Chronic   right rib fractures are seen along with some associated pleural   thickening. Rounded density laterally in the left lung base is unchanged   and consistent with a calcified granuloma. Bilateral hilar fullness is   unchanged.  IMPRESSION:   1. COPD.  2. Persistent fullness of the hilar structures bilaterally.  3. Chronic changes in the right upper lobe and chest wall. Small pleural   effusions.    Dictation Site: 6        Verified By: Sundra Aland, M.D., MD  [place]:    15-Jun-14 15:43, [place] Color Flow Doppler Low Extrem Bilat (Legs)  [place] Color Flow Doppler Low Extrem Bilat (Legs)  REASON FOR EXAM:    swelling, r/o DVT  COMMENTS:       PROCEDURE: [place]  - US DOPPLER LOW EXTR BILATERAL  - Feb 27 2013  3:43PM     RESULT: Bilateral lower extremity Doppler is performed. Compression   grayscale images in the right leg from the common femoral vein to the   popliteal vein show complete compressibility with a normal appearance of   the color Doppler and spectral Doppler images.    Evaluation of the left leg demonstrates incomplete compressibility in the   left superficial femoral veinfrom the mid to distal portion consistent   with thrombus. There may be a small amount of flow present. Popliteal   vein appear to compress normally. The common femoral vein is unremarkable.  IMPRESSION:  Thrombus in the left superficial femoral vein. A trace   amount of flow appears present. Otherwise unremarkable study.    Dictation Site:  6        Verified By: Sundra Aland, M.D., MD  LabUnknown:    10-Apr-14 10:13, Elbow Right Complete  PACS Image    10-Apr-14 11:07, Chest PA and Lateral  PACS Image    10-Apr-14 11:07, Forearm Right  PACS Image    10-Apr-14 11:07, Humerus Right  PACS Image    11-Apr-14 15:35, CT Head Without Contrast  PACS Image    13-Apr-14 12:42, CT Head Without Contrast  PACS Image    15-Apr-14 08:01, Chest PA and Lateral  PACS Image    15-Apr-14 10:46, CT Neck With Contrast  PACS Image    15-Apr-14 21:54, Chest Portable Single View  PACS Image    18-Apr-14 10:28, Chest Portable Single View  PACS Image    18-Apr-14 17:14, CT Chest for Pulm Embolism With Contrast  PACS Image    14-Jun-14 20:21, CT Maxillofacial Area Without Contrast  PACS Image    14-Jun-14 20:27, Chest PA and Lateral  PACS Image    14-Jun-14 23:08, CT Chest for Pulm Embolism With Contrast  PACS Image    15-Jun-14 15:43, [place] Color Flow Doppler Low Extrem Bilat (Legs)  PACS Image  CT:    11-Sep-13 11:37, CT Head Without Contrast  CT Head Without Contrast  REASON FOR EXAM:    fall , hti head  COMMENTS:       PROCEDURE: CT  - CT HEAD WITHOUT CONTRAST  - May 26 2012 11:37AM     RESULT: Comparison:  09/09/2011    Technique: Multiple axial images from the foramen magnum to the vertex   were obtained without IV contrast.    Findings:    There is no evidence for mass effect, midline shift, or extra-axial fluid   collections. There is no evidence for space-occupying lesion,   intracranial hemorrhage, or cortical-based area of infarction.   Periventricularand subcortical hypoattenuation is consistent with     chronic small vessel ischemic disease.     There is a tiny radiopacity along the medial aspect of the left globe in   the region of the sclera, similar to prior. This is of uncertain   etiology, possibly postsurgical.    The osseous structures are unremarkable.    IMPRESSION:    1. No acute  intracranial process.  2. Chronic small vessel ischemic disease.          Verified By: Gregor Hams, M.D., MD    [phone] 20:09, CT Head Without Contrast  CT Head Without Contrast  REASON FOR EXAM:    trauma, fall, head injury  COMMENTS:       PROCEDURE: CT  - CT HEAD WITHOUT CONTRAST  - Jul 19 2012  8:09PM     RESULT:  Axial noncontrast CT scanning was performed through the brain   with reconstructions at 5 mm intervals and slice thicknesses. Comparison   is made to the study of May 26, 2012.    There is moderate diffuse cerebral and cerebellar atrophy with   compensatory ventriculomegaly. These findings are stable. There is no   intracranial hemorrhage nor intracranial mass effect. There is likely an   old lacunar infarction in the posterior limb of the right internal   capsule. There is no evidence of an evolving ischemic infarction. At bone   window settings the observed portions of the paranasal sinuses and     mastoid air cells are clear. There is no evidence of an acute skull   fracture.    IMPRESSION:   1. There is no evidence of an acute ischemic or hemorrhagic event.  2. There are age-related atrophic changes with mild compensatory   ventriculomegaly which appears stable.  3. There is no evidence of an acute skull fracture.     Dictation Site: 5          Verified By: DAVID A. Martinique, M.D., MD    11-Apr-14 15:35, CT Head Without Contrast  CT Head Without Contrast  REASON FOR EXAM:    rule out CVA  COMMENTS:   LMP: Post-Menopausal    PROCEDURE: CT  - CT HEAD WITHOUT CONTRAST  - Dec 24 2012  3:35PM     RESULT: Comparison:  07/19/2012    Technique: Multiple axial images from the foramen magnum to the vertex   wereobtained without IV contrast.    Findings:    There is no evidence for mass effect, midline shift, or extra-axial fluid   collections. There is no evidence for space-occupying lesion,   intracranial hemorrhage, or cortical-based area of  infarction. Mild   periventricular hypoattenuation is like a sequela of chronic     microangiopathy.    The osseous structures are unremarkable.    IMPRESSION:    No acute intracranial process.    CT can underestimate ischemia in the first 24 hours after the event. If   there is clinical concern for an acute infarct, a followup MRI or repeat   CT scan in 24 hours may provide additional information.        Verified By: Gregor Hams, M.D., MD    13-Apr-14 12:42, CT Head Without Contrast  CT Head Without Contrast  REASON FOR EXAM:    fall and superficial bleeding from vertex of head  COMMENTS:       PROCEDURE: CT  - CT HEAD WITHOUT CONTRAST  - Dec 26 2012 12:42PM     RESULT: Comparison:  12/24/2012    Technique: Multiple axial images from the foramen magnum to the vertex   were obtained without IV contrast.    Findings:    There is no evidence for mass effect, midline shift, or extra-axial fluid   collections. There is no evidence for space-occupying lesion,   intracranial hemorrhage, or cortical-based areaof infarction. Mild   periventricular hypoattenuation is likely sequela of chronic     microangiopathy.    There is soft tissue swelling along the left frontal scalp.    The osseous structures are unremarkable.    IMPRESSION:    No acute intracranialprocess.      Dictation Site: 8        Verified By: Gregor Hams, M.D., MD    15-Apr-14 10:46, CT Neck With Contrast  CT Neck With Contrast  REASON FOR EXAM:    tender firm mass just beneath the right jaw  COMMENTS:       PROCEDURE: CT  - CT NECK WITH CONTRAST  - Dec 28 2012 10:46AM     RESULT: CT of the neck is performed with 70 mL of Isovue-370 iodinated   intravenous contrast with images reconstructed at 3.0 mm slice thickness   in the axial plane. Multiplanar reconstructions were performed at the   time of dictation utilizing the [place]. There is no previous   similar study for  comparison.    There is a large area of abnormal low attenuation with peripheral   enhancement involving the submandibular region on the right with   extension into the floor of the mouth parapharyngeal region. Inferior   extent of this is to approximately the level of the hyoid. The overall    size of this is somewhat difficult to characterize given artifact from   the patient's dental work but an anterior to posterior dimension on image   47 is much as 4.69 cm with a transverse dimension of approximately 5.2   cm. There is a well-circumscribed peripherally enhancing low-attenuation   area in the left neck supraclavicular region extending into the superior   mediastinum displacing the trachea slightly toward the right measuring   4.81 cm in long axis on the oblique superior to inferior dimension with   an anterior to posterior dimension of approximately 3.64 cm and a   transverse dimension of 4.0 cm. Nodularity is seen in the right lung apex   with some associated pleural thickening. This nodular density is pleural   based and measures as much as 2.74 cm anterior to posterior and 2.20 cm   transversely. There is thickening in the major fissure on the right   incompletely visualized. And probable fibrosis is seen in the left lung   apex. There is submental fullness and rightsubmandibular adenopathy. The   base the brain shows evidence of atrophy without a discrete mass. The     orbits appear unremarkable. The sinuses and mastoids are clear.    IMPRESSION:  Large mass with possible necrosis and adjacent adenopathy in   the right neck extending from the parapharyngeal region into the   submandibular region and extensive involvement laterally. No clear   involvement of the submandibular gland or parotid gland on the right can   be established. A cystic mass with peripheral enhancement which may arise   in the left thyroid lobe area correlate clinically. Nodular density in   the right lung  apex. Underlying malignancy is not excluded. Fibrosis   certainly could be present.    Dictation Site: 1        Verified By: Sundra Aland, M.D., MD    18-Apr-14 17:14, CT Chest for Pulm Embolism With Contrast  CT Chest for Pulm Embolism With Contrast  REASON FOR EXAM:    SOB,lovenox stopped due to recent falls  COMMENTS:       PROCEDURE: CT  - CT CHEST (FOR PE) W  - Dec 31 2012  5:14PM     RESULT: A chest CT is performed with 100 mL of Isovue-370 iodinated   intravenous contrast. Images are reconstructed at 3 mm slice thickness in   the axial plane. There is no previous similar study for comparison.    There is a cystic area that appears to be in the left thyroid lobe   measuring up to 3.8 cm  in the oblique long axis on image 15. There is   soft tissue density in the right lung apex with irregular margins   measuring 2.29 cm. While the possibility of fibrosis is not excluded.   Underlying malignancy is a consideration. Less prominent likely fibrotic   changes are seen at the left lung apex. There is respiratory motion     artifact. Dependent atelectasis is present in both lungs. This is   slightly worse on the left than the right. There is a trace pleural   effusion bilaterally. No pericardial effusion is seen. The thoracic aorta   is normal in caliber without dissection. There is no mediastinal   adenopathy or hilar mass or adenopathy. No axillary mass or adenopathy is   evident. Supraclavicular regions appear to be unremarkable. There is no   filling defect in the pulmonary arteries to suggest pulmonary embolism.   The included upper abdominal structures appear within normal limits for   the phase of contrast injection. There is a calcified granuloma laterally   in the left lower lobe. The bony structures appear intact with   degenerative changes in the spine.    IMPRESSION:   1. Irregularly marginated density dural based in the right lung apex   could represent  fibrosis or underlying malignancy.  2. Emphysematous lung disease with trace bilateral pleural effusions.  3. Nodular density in the left lower lobe laterally between images 60 and   64 with calcification consistent with granulomatous disease.  4. No pulmonary embolic filling defect. No thoracic aortic aneurysm or   thoracic aortic dissection.    Dictation Site: 6        Verified By: Sundra Aland, M.D., MD    14-Jun-14 20:21, CT Maxillofacial Area Without Contrast  CT Maxillofacial Area Without Contrast  REASON FOR EXAM:    nose trauma  COMMENTS:       PROCEDURE: CT  - CT MAXILLOFACIAL AREA WO  - Feb 26 2013  8:21PM     RESULT: History: Trauma    Comparison: No comparison    Technique: Multiple axial images obtained of the maxillofacial bones with   coronal reformatted images provided.    Findings:    The globes are intact. The orbital walls are intact. The orbital floor is     intact. The maxilla and mandible are intact. The zygomatic arches are   intact. The nasal septum is midline. There is nonasal bone fracture.   There are degenerative changes of bilateral temporomandibular joints.    The paranasal sinuses are clear. The visualized portions of the mastoid   sinuses are well aerated.    IMPRESSION:     No acute osseous injury of the maxillofacial bones.    Dictation Site: 1        Verified By: Jennette Banker, M.D., MD    14-Jun-14 23:08, CT Chest for Pulm Embolism With Contrast  CT Chest for Pulm Embolism With Contrast  REASON FOR EXAM:    sob  COMMENTS:       PROCEDURE: CT  - CT CHEST (FOR PE) W  - Feb 26 2013 11:08PM     RESULT: Chest CT is performed with 75 mL of Isovue-370 iodinated   intravenous contrast utilizing a CTA protocol with comparison made to   previous exam of 12/31/2012. Images are reconstructed at 3 mm slice   thickness in the axial plane.    There is persistent irregularly marginated soft tissue density in the  right lung apex  posteriorly measuring up to 3.4 cm anterior to posterior   and 2.6 cm transversely. There is enlargement of the left thyroid lobe   extending into the retrosternal region displacing the trachea toward the   right minimally with a predominately cystic appearance. This is     unchanged. Borderline enlarged precarinal lymph node is present measuring   9.8 mm in short axis on image 32 mediastinal window settings. This is   unchanged. The pulmonary arteries are well-opacified without filling   defect. There is patchy pneumonia in the left lower lobe with minimal   infiltrate versus atelectasis of the right lower lobe. Stable calcified   granuloma is seen on the left between images 49 and 51. The thoracic   aorta is normal in caliber without evidence of dissection. Heart is   mildly enlarged. There is no pericardial effusion. Underlying   emphysematous lung disease is present area respiratory motion artifact is   present. The bony structures appear intact. The included upper abdominal   structures are within normal limits.    IMPRESSION:   1. Bilateral patchy areas of increased density worse on the left than the   right concerning for bilateral pneumonia. The possibility of some     atelectasis is not completely excluded.  2. Stable spiculated pleural-based density in the right lung apex which   could represent underlying carcinoma. Fibrosis is not completely excluded.  3. Stable calcified granuloma in the left lung base.  4. Mild cardiomegaly.  5. Stable enlargement of the left thyroid lobe dominantly cystic mass   causing slight displacement of the trachea toward the right. There shows   retrosternal extension into the superior mediastinum only minimally.  6. No thoracic aortic aneurysm or dissection. No pulmonary embolic   filling defect.  7. There is a thoracic scoliosis concave to the left. No acute bony   abnormality is evident.    Dictation Site: 6    Verified By: Sundra Aland, M.D., MD   ASSESSMENT AND PLAN:  Assessment/Admission Diagnosis LLE DVT.  Not a candidate for long term anticoagulation with dementia and fall risk   Plan Given her inability to be on typical therapeutic anticoagulation regimen, IVC filter is reasonable.  Risks and benefits discussed and they desire to proceed   Electronic Signatures: Algernon Huxley (MD)  (Signed 16-Jun-14 12:48)  Authored: Chief Complaint and History, PAST MEDICAL/SURGICAL HISTORY, ALLERGIES, HOME MEDICATIONS, Family and Social History, Review of Systems, Physical Exam, LABS, RADIOLOGY, Assessment and Plan   Last Updated: 16-Jun-14 12:48 by Algernon Huxley (MD)

## 2015-01-05 NOTE — Consult Note (Signed)
Brief Consult Note: Diagnosis: Right displace olecranon fracture.   Patient was seen by consultant.   Consult note dictated.   Comments: I have been asked to see this 81 y/o female for her olecranon fracture.  Patient does not need emergent surgery.  Plan for patient to follow up in the office.  Patient may try and use a platform walker.  Splint removed.  Recommend sling for comfort.  Follow up in my office in 2 weeks.  Electronic Signatures: Juanell FairlyKrasinski, Oluwaseyi Raffel (MD)  (Signed 14-Apr-14 13:24)  Authored: Brief Consult Note   Last Updated: 14-Apr-14 13:24 by Juanell FairlyKrasinski, Alveta Quintela (MD)

## 2015-01-05 NOTE — Consult Note (Signed)
PATIENT NAME:  Holly Drake, Yarianna MR#:  [id number] DATE OF BIRTH:  29-Sep-1923  DATE OF CONSULTATION:  12/27/2012  REFERRING PHYSICIAN:   CONSULTING PHYSICIAN:  Kathreen DevoidKevin L. Zai Chmiel, MD  REASON FOR CONSULTATION:  Right olecranon fracture.   HISTORY OF PRESENT ILLNESS:  The patient is an 81 year old female with a history of dementia, hypertension, rheumatoid arthritis who was admitted after a fall in which she sustained a right olecranon fracture.  The patient had recently been placed in independent living and has had two falls since her arrival at the independent living facility.   PAST MEDICAL HISTORY:  Dementia, depression, rheumatoid arthritis, osteoporosis, hypertension, and a history of a pelvis fracture.   MEDICATIONS:  Include Vitamin D3, sulfasalazine, Norco, metoprolol, losartan, citalopram, Caltrate, Advair Diskus and Azo cranberry.   ALLERGIES:  No known drug allergies.   PHYSICAL EXAMINATION:   The patient's daughter was at the bedside when I examined the patient.  I removed her sugar tong splint.  She had mild swelling around the elbow with ecchymosis.  The skin was closed.  The patient was able to flex her elbow approximately 120 degrees.  She had a palpable fracture with tenderness at the olecranon.  There was no instability on examination.  The patient could flex and extend all five digits and had intact sensation to light touch and a palpable radial pulse.   RADIOLOGIC STUDIES:  The patient's right elbow films have demonstrated a fracture through the olecranon with displacement of the proximal fragment.  There is no associated dislocation.  There is no obvious comminution at the fracture site.   ASSESSMENT:  Displaced right olecranon fracture.   PLAN:  I had a long discussion with the patient's daughter regarding the injury and the surgical options.  The treatment options would include nonoperative management versus surgical fixation.  Surgery could be performed with a plate or a  figure-of-eight construct with wire.  The patient's daughter would like to hold off on surgery until the patient's clinical condition improves.  I agreed with this plan.  I would like to see the patient back in two weeks for re-evaluation in my office.  In the meantime, she may work on elbow range of motion exercises.  I have ordered a sling for her right arm to use for her comfort.  I have talked to the physical therapist to explain that patient may use a platform walker as tolerated.     ____________________________ Kathreen DevoidKevin L. Tanazia Achee, MD klk:ea D: 12/28/2012 22:54:18 ET T: 12/28/2012 23:33:57 ET JOB#: [id number]  cc: Kathreen DevoidKevin L. Jaques Mineer, MD, <Dictator> Kathreen DevoidKEVIN L Aniket Paye MD ELECTRONICALLY SIGNED 01/06/2013 16:45

## 2015-01-05 NOTE — H&P (Signed)
PATIENT NAME:  Holly Drake, Holly Drake MR#:  [id number] DATE OF BIRTH:  1924/08/11  DATE OF ADMISSION:  02/27/2013  REFERRING M.D: Dr. Clemens Catholicagsdale.  PRIMARY CARE PHYSICIAN: None local.   CHIEF COMPLAINT: Fall.   HISTORY OF PRESENT ILLNESS: The patient is an 81 year old female who lives at [facility], who was brought into the ER after she sustained a fall. The patient was found on the floor laying on her back in the apartment. The patient has a chronic history of dementia and she is a poor historian. She was just complaining of nose pain to the ER physician, and she was unable to provide a good history. In the ER the patient was found to be confused. The confusion came from her baseline dementia. Was admitted to the neuro unit in the ER. The patient was wheezing, and from the previous history she has a questionable right parietal density which needs to be further investigated. The patient was hypoxemic. A repeat CAT scan of the chest was done which has revealed no evidence of pulmonary embolism, but persistent spiculated lesion at the right apex, left lower lobe pneumonia. Hospitalist team was contacted to admit the patient.   PAST MEDICAL HISTORY: Dementia, depression, rheumatoid arthritis, osteoporosis, history of rib fractures, hypertension.   PAST SURGICAL HISTORY: None.   NO KNOWN DRUG ALLERGIES.   HOME MEDICATIONS: Vitamin D3 2000 international units, 1 tablet p.o. once a day, sulfasalazine 500 mg 2 times a day, sulfasalazine 500 mg once daily, Norco 5/325 1 to 2 tablets every 6 hours, metoprolol tartrate 25 mg 2 tablets every 12 hours, losartan 100 mg once a day, escitalopram 20 mg once a day, Calcitriol 600 once a day, amlodipine 10 mg once a day, Advair 250/50 1 puff inhalation 2 times a day.   PSYCHOSOCIAL HISTORY: Lives at an assisted living. Stopped smoking over 25 years ago.   FAMILY HISTORY: Unknown.   REVIEW OF SYSTEMS: CONSTITUTIONAL: Denies any fever or fatigue. No  weight gain or weight loss.  HEENT: Normocephalic. Denies any headache, blurry vision, sinus problems. No swelling difficulties.  LUNGS: The patient has a history of COPD. No hemoptysis. CARDIOVASCULAR: Denies any chest pain, palpitations, dizziness, syncope.  GASTROINTESTINAL: No hematemesis. No melena. No GERD. No liver problems.  GENITOURINARY: No dysuria or hematuria.  ENDOCRINE:No polyuria,polydipsia. No thyroid problems. GYN: Denies any breast masses or vaginal discharge.  MUSCULOSKELETAL: Denies any gout. Limited activity. Has Rheumatoid arthritis. INTEGUMENTARY: No acne, rash, lesions.  PSYCHIATRIC: Denies any ADD, OCD, bipolar disorder, schizophrenia.   PHYSICAL EXAMINATION: VITAL SIGNS: Temperature 98.1, pulse 62, respirations 22, blood pressure 140/77 and pulse ox 94%.  GENERAL APPEARANCE: Not under acute distress. Moderately built and moderately nourished.  HEENT: Normocephalic. Pupils are equally reactive to light and accommodation. No scleral icterus. No conjunctival injection. No sinus tenderness. No postnasal drip.  NECK: Supple. No JVD. No thyromegaly. No lymph nodes on palpation.  CARDIAC: S1, S2 normal. Regular rate and rhythm. No murmurs. No gallops.  GASTROINTESTINAL: Soft. Bowel sounds are positive in all 4 quadrants. Abdomen nontender, nondistended. No masses felt. No hepatosplenomegaly.  NEUROLOGIC: Awake, alert, oriented x 3. Motor and sensory are grossly intact. Reflexes are 2+.   EXTREMITIES: No cyanosis. No clubbing, but 2+ pitting edema is present. Distal pulses are 1+.  SKIN: Warm to touch. Normal turgor. No rashes. No lesions.  PSYCH: Normal mood and effect.  LABS AND IMAGING STUDIES: CAT scan of the chest has revealed no evidence of pulmonary embolism. There are some  spiculated lesions in the right apex, which may represent scar, however loculations or cannot be excluded, left lower lobe pneumonia.   The remainder of the findings are described in the body of  the report.   EKG has revealed sinus bradycardia. Glucose 121, BNP 605, BUN 15, creatinine 0.59, sodium 134, potassium 3.9, chloride is 97, CO2 35, GFR greater than 150, serum osmolality 270, calcium 9.1.   CK total 4.4, CPK-MB 2.1, and troponin less than 0.02. WBC 6.7, hemoglobin 11.7, hematocrit 34.8, platelets are 207. CK is 144, CPK-MB 2.1, troponin less than 0.02.   ASSESSMENT AND PLAN: An 81 year old female was brought into the ER after she sustained a fall. The patient with lower extremity edema and pleural effusions and diffusely wheezing  with hypoxia.   ASSESSMENT AND PLAN: 1.  Acute exacerbation of chronic obstructive pulmonary disease: Will continue Solu-Medrol  60 mg intravenous q.6 hours, nebulizer treatments, and levofloxacin. 2. Pneumonia : Antibiotics.  3. Persistent mass at the right apex: Regarding this, hematology/oncology consultation is placed.  4.  For bilateral lower extremity edema will get an echocardiogram, and the patient will be on p.o. Lasix.  5.  The patient has a chronic history of dementia, and she is very hard of hearing.    The patient is DNR.   Total time spent on admission is 50 minutes.     ____________________________ Ramonita Lab, MD ag:dm D: 02/27/2013 02:38:07 ET T: 02/27/2013 14:40:52 ET JOB#: [id number]  cc: Ramonita Lab, MD, <Dictator> Ramonita Lab MD ELECTRONICALLY SIGNED 02/28/2013 23:02

## 2015-01-05 NOTE — H&P (Signed)
PATIENT NAME:  Holly Drake, Holly Drake MR#:  [id number] DATE OF BIRTH:  1923/12/14  DATE OF ADMISSION:  12/23/2012  PRIMARY CARE PHYSICIAN:  Dr. Arline Asp.   CHIEF COMPLAINT:  Fall with an elbow fracture.  A very pleasant 81 year old female with a history of dementia, hypertension, rheumatoid arthritis who presents with above complaint. Apparently patient was in independent living. She was eating breakfast and she fell out of her chair and suffered an elbow fracture. Dr. Sabra Heck from orthopedic surgery actually saw the patient while in the ER and he recommended surgery; however, the family is to decide whether or not the patient should go to surgery or not. She has also had congestion and cough for the past several weeks, been started on Claritin and Advair by Dr. Arline Asp for this.  REVIEW OF SYSTEMS:  CONSTITUTIONAL: No fever, fatigue, weakness. EYES:  No blurred or double vision.  ENT:  She had some hearing loss. No snoring or discharge.  RESPIRATORY: Positive cough, positive wheezing, but no wheezing now. No dyspnea. Painful respirations.  CARDIOVASCULAR: No chest pain, orthopnea, edema, arrhythmia, dyspnea on exertion, palpitations or syncope.  GASTROINTESTINAL:  No nausea, vomiting, diarrhea, abdominal pain, melena or ulcers. GENITOURINARY:  No dysuria or hematuria.  ENDOCRINE:  No polyuria or polydipsia   Positive easy bruising.  SKIN:  No rash or lesions.  MUSCULOSKELETAL: She does not have elbow pain at this time.  NEUROLOGIC:  No history of CVA, TIA.  She does have dementia.  PSYCHIATRIC: No history of anxiety.  She does have depression.  PAST MEDICAL HISTORY:  1.  Dementia.  2.  Depression. 3.  Rheumatoid arthritis. 4.  Osteoporosis.  5.  History of pelvic fracture.  6.  Hypertension.  MEDICATIONS: 1.  Vitamin D3 2000 international units daily.  2.  Sulfasalazine 500 mg b.i.d.  3.  Norco 05/325 two tablets q. 6 hours p.r.n. pain.  4.  Metoprolol 50 mg b.i.d. 5.  Losartan 100 mg daily.  6.   Citalopram 20 mg daily.  7.  Caltrate 600 mg daily.  8.  AZO cranberry 1 tablet daily.  9.  Advair Diskus 250/50 b.i.d.   ALLERGIES:  No known drug allergies.   PAST SURGICAL HISTORY:  None.   SOCIAL HISTORY:  The patient lives in independent living. She stopped smoking over 25 years ago.   FAMILY HISTORY:  Unknown.   PHYSICAL EXAMINATION: VITAL SIGNS: The patient is afebrile, temperature 98.2. Pulse is 66, respirations 20, blood pressure 180/73, 92% on room air.  GENERAL:  The patient is pleasant, alert and oriented to name and place, not time.  HEENT: Head is atraumatic. Pupils are round.  Sclerae anicteric. Mucous membranes are dry.  Oropharynx is clear.  NECK: Supple without JVD, carotid  bruit or enlarged thyroid.  CARDIOVASCULAR: Regular rate and rhythm. No appreciable murmurs, gallops or rubs. PMI is not displaced.  LUNGS:  Clear to auscultation without crackles, rales, rhonchi or wheezing.  ABDOMEN:  Bowel are sounds present, nontender, nondistended. No hepatosplenomegaly.  EXTREMITIES: No clubbing, cyanosis or edema. She does have a right elbow splint.  NEUROLOGIC: Cranial nerves II through XII are grossly intact. No focal deficits.  STRENGTH:  She has 4/5 strength in the right lower extremity. This apparently is from an old pelvic fracture, 5/5 in left lower extremity.   SKIN:   Without rashes or lesions.   LABORATORY DATA: White blood cells 10.5, hemoglobin 13.2, hematocrit 39.4, platelets at 245, sodium 132, potassium 4.1, chloride 96, bicarb 31, BUN 8, creatinine  0.51, glucose 112, calcium 8.9, bilirubin 0.5, alk phos 93, ALT 16, AST 21, total protein 6.7, albumin 3.4, CK 91, CPK-MB 1.3. Troponin less than 0.02.  X-ray shows severely displaced olecranon process.  Chest x-ray shows no acute abnormality.  Right forearm severely displaced olecranon fracture. Forearm is intact.  Right elbow comminuted severely displaced olecranon process fracture.  And now she shows no   nitrites.   EKG normal sinus rhythm with incomplete right bundle branch block.  ASSESSMENT AND PLAN: An 81 year old female who presents with an accidental fall and an olecranon fracture. 1. Right olecranon fracture after an accidental fall. We appreciate Dr. Ammie Ferrier consult. The patient's family is to decide whether or not the patient will go for surgery or not. They are hesitant due to her dementia. For now, we will continue pain meds, OT PT case management consult and continue the splint as per Dr. Sabra Heck.  2.  Dementia.  We will continue to monitor closely.  3. Accelerated hypertension. Her blood pressure is elevated. We will restart her outpatient medication. I suspect some of this is related to her pain.  4.  History of RA. Will continue outpatient medications. 5. Depression. Continue her outpatient medications. 6. hyponatremia. Could be a dehydration component. I will recheck BMP in the a.m. and also provide some IV fluids. If this is abnormal, we may consider ordering a sodium level.   CODE STATUS:  The patient is a DNR status.   TIME SPENT:  Approximately 45 minutes.    ____________________________ Donell Beers. Benjie Karvonen, MD spm:ce D: 12/23/2012 16:50:44 ET T: 12/23/2012 17:03:46 ET JOB#: [id number]  cc: Holly Drake P. Benjie Karvonen, MD, <Dictator>  Dr. Arneta Cliche P Holly Malacara MD ELECTRONICALLY SIGNED 12/23/2012 19:44

## 2015-01-05 NOTE — Consult Note (Signed)
  DATE OF BIRTH:  Nov 10, 1923  DATE OF CONSULTATION:  12/28/2012  ATTENDING PHYSICIAN:  Dr. Nemiah CommanderKalisetti  CONSULTING PHYSICIAN:  Davina Pokehapman T. Heath Tesler, MD  REASON FOR CONSULTATION:  Right neck swelling and severe sore throat.   HISTORY OF PRESENT ILLNESS:  An 81 year old female who was admitted on the 10th with an elbow fracture. According to the family, since last Thursday she has been complaining of a sore throat. It has gotten progressively worse. They had elected not to do a surgical procedure to repair the elbow, and were prepared to send her home today when she was noted to have significant neck swelling. Her family, as I said, says that she has been complaining of a sore throat since last Thursday.   PAST MEDICAL HISTORY: Significant for dementia, depression, rheumatoid arthritis, osteoporosis, history of pelvic fracture, and hypertension.   MEDICATIONS:  She is on multiple medications, which are noted and listed in the chart.   ALLERGIES:  She has no drug allergies.   PAST SURGICAL HISTORY:  Noncontributory.   SOCIAL HISTORY:  She lives in independent living. She stopped smoking 25 years ago.   FAMILY HISTORY:  Noncontributory.   PHYSICAL EXAMINATION: She is breathing through her mouth. There is no stridor. No evidence of airway obstruction. Her external ears appear clear. Her anterior nose is widely patent. Oral cavity/ oropharynx shows moderate trismus. She has obvious  exudate over the right tonsil, with also what appears to have a right peritonsillar abscess. She also has significant tender right upper neck swelling. Review of her CT scan shows what appears to be a peritonsillar abscess, which appears to extend down into the jugulodigastric region. There is also significant surrounding adenopathy. The great vessels do not appear to be involved.  IMPRESSION: Right peritonsillar abscess and possible direct extension down into the jugulodigastric region versus some sort of periodontal  inflammatory process. My best estimation is this is most likely a peritonsillar abscess. 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   I had a long, about a 35 to 40-minute, discussion with family today. We discussed the options, including IV antibiotics and possible steroid therapy versus incision and drainage. We discussed the risks and benefits of surgery including bleeding, infection, worsening dementia, and cardiac event. They would like to proceed with operative drainage. My plan will be to do an inspection of the oral cavity and possible I and D of the abscess, depending on how much I obtain from this, then make a decision about whether to do an I and D of the neck, or to try to drain that with a needle. They are in agreement with this plan. We will take her to the Operating Room emergently.    ____________________________ Davina Pokehapman T. Jahleel Stroschein, MD ctm:mr D: 12/28/2012 19:22:32 ET T: 12/28/2012 19:31:22 ET JOB#: [id number]  cc: Davina Pokehapman T. Felix Pratt, MD, <Dictator> Davina PokeHAPMAN T Quillan Whitter MD ELECTRONICALLY SIGNED 01/14/2013 7:55

## 2015-01-05 NOTE — Consult Note (Signed)
PATIENT NAME:  Holly Drake, Holly Drake MR#:  [id number] DATE OF BIRTH:  1924-04-09  DATE OF CONSULTATION:  02/27/2013  REFERRING PHYSICIAN:   CONSULTING PHYSICIAN:  Simonne Come. Holly Pilgrim, MD  HISTORY OF PRESENT ILLNESS: Holly Drake is an 81 year old patient who was admitted on June 15th from [facility] assisted living. She was found on the floor and could not give a history of how she had fallen. She was confused which is apparently at her baseline. She was found to be wheezing and slightly hypoxic. CT scan was done and showed an old upper lobe lesion, noted 2 months ago for which followup had been considered, and also bilateral atelectasis or infiltrate. The patient was hospitalized, given Solu-Medrol and Lasix. Given Levaquin and later Zosyn. Also had some lower extremity edema. Given Lasix. Echocardiogram planned. Doppler ultrasound was done which revealed some deep venous thrombosis in the left superficial femoral vein, and Lovenox was started. Oncology is consulted for the finding of a right upper lobe spiculated mass on the CT scan. There is also a questionable small precarinal lymph node. No definite mass or adenopathy. There is thyroid enlargement with some displacement of the trachea.   The patient has also on admission a borderline anemia with hemoglobin 11.7 and normal creatinine, normal white count and platelets.   PAST MEDICAL HISTORY: The patient has a history of a fall and olecranon fracture, dementia, accelerated hypertension, rheumatoid arthritis, depression, right peritonsillar abscess drained in the hospital by ENT back in April, hospitalization April 11th to April 20th with peritonsillar abscess and fall and as stated hypertension and acute respiratory failure. History includes COPD and emphysema. The patient also had a fall with a laceration of the head during the previous hospitalization.   MEDICATIONS: Recently after the hospital discharge include Advair Diskus 1 puff twice a day, Caltrate,  Celexa 20 mg daily, Losartan 100 mg daily, vitamin D daily, Norco p.r.n., metoprolol 75 b.i.d., amlodipine 10 mg daily. Took amoxicillin and Augmentin which completed in the past, Protonix 40 daily. Had previously had a prednisone taper.   FAMILY HISTORY: From the chart. The patient unable to give any history. Also unknown at this time.   SOCIAL HISTORY: Not known if the patient was a smoker or any alcohol history. From a prior hospital history and physical, the patient smoked more than 30 years ago and no alcohol.   SYSTEM REVIEW: Not obtainable. The patient is awake, alert, can answer some questions, can briefly converse but is confused. Gives nonsensical answers, and has no knowledge of her history and is easily disoriented.   PHYSICAL EXAMINATION:  GENERAL: The patient was alert and cooperative, in no acute distress.  HEENT: Slight pallor. No jaundice. Mouth: No thrush.  LYMPHATIC: No palpable lymph nodes in the neck, supraclavicular, submandibular or axilla.  LUNGS: Decreased air entry. No wheezing or rales.  HEART: Regular.  ABDOMEN: Nontender. No palpable mass or organomegaly.  NEUROLOGIC: Grossly nonfocal.  EXTREMITIES: Some edema was present. Was reported as 2+ pitting edema at the time of admission.  SKIN: There are no rashes or significant bruising.   LABORATORY DATA: An EKG showed bradycardia. Creatinine was 0.59 on admission. The CBC showed a white count of 5.5 today, hemoglobin 11.2, hematocrit 33.2, platelets 201. There is some lymphopenia. The liver chemistries from June 14th unremarkable with an albumin of 3.3. There is a CBC from September 2013. The hemoglobin was 12.5. On April 10th, the hemoglobin was 13.2.   Subsequently, a Doppler ultrasound has been done that  shows a deep vein thrombosis on the left.   IMPRESSION AND PLAN: The patient with chronic obstructive pulmonary disease, may have aspiration or some heart failure. No sign of clear infection. She is being given  antibiotics along with steroids and Lasix. The patient has dementia, had recently just fallen, and previous hospitalization had fallen with a laceration of scalp in the hospital. There is a borderline anemia that does not appear to be a significant hematology disorder. Could check B12 and iron studies. There is no history of blood loss. The level of hemoglobin is compatible with age and possible underlying malignancy and chronic disease. With respect to right upper lobe mass, the radiologist has already noted that the appearance is highly consistent with malignancy but could also be fibrosis. There is a small pretracheal node. It is certainly not established that this is malignancy. However, the patient would not be a surgical candidate with age and comorbidities and apparently lung disease. Also would be a poor candidate for radiation treatments. I think we would be limited by her ability to cooperate. It has been previously noted that family members were indecisive about whether or not to pursue any aggressive evaluation. I have not met with family members at this time. I could do so at a later time. If the patient's family wanted to pursue an aggressive course, potential would be to get a CT-guided biopsy of the lung. I think there would be a moderate risk of pneumothorax. Then, if the patient had a malignancy, she would not be a candidate for chemotherapy or surgery. She could have palliative radiation treatments, but there is no evidence that she has symptomatic disease. She could have progressive or stereotactic radiation possibly. Could affect the natural course of the disease if limited but her ability to tolerate participating in treatments would be limited. Potentially, if she had an adenocarcinoma with mutation, she could be a candidate for targeted therapy. Even those have toxicities. It is certainly reasonable to pursue watchful waiting. Get followup noncontrast CTs in the future given her other health  issues.   With respect to the blood clot, the patient started anticoagulation. That is appropriate in the short term, but consideration has to be given to whether or not the patient would be anticoagulated or would she have a filter placed. Given the history of frequent falls and recently just found on the floor, I think she is a very high risk for chronic anticoagulation. With respect to cause of the clot, I think the recent hospitalization and some immobility, possible underlying malignancy, advanced age. No history of prior clots. I do not believe that any hypercoagulable workup is indicated.   SUMMARY AND PLAN:  1. I would check a B12, iron, TIBC and ferritin.  2. I would consider the patient is a poor candidate for chronic anticoagulation due to risk of fall and hemorrhage. Ideally, could have anticoagulation for 4 weeks in a controlled environment or briefly during hospitalization and consult the family and vascular surgery about possibly placing a filter.  3. I would want to meet with or hear from the family members about the possibility of pursuing an aggressive evaluation for possible underlying malignancy. The patient is a very poor candidate for invasive procedures, at risk for diagnostic procedures, limited candidate for any aggressive treatments.   ____________________________ Simonne Come. Holly Pilgrim, MD rgg:gb D: 02/27/2013 21:11:47 ET T: 02/28/2013 03:39:08 ET JOB#: [id number]  cc: Simonne Come. Holly Pilgrim, MD, <Dictator> Dallas Schimke MD ELECTRONICALLY SIGNED 03/04/2013 14:04

## 2015-01-05 NOTE — Discharge Summary (Signed)
PATIENT NAME:  Holly Drake, Holly Drake MR#:  [id number] DATE OF BIRTH:  [birth year]  DISCHARGE SUMMARY ADDENDUM  DATE OF ADMISSION:  12/24/2012 DATE OF DISCHARGE:  01/03/2013  PRIMARY CARE PHYSICIAN: Dr. Alonna BucklerAndrew Lamb.  Please see discharge summary dictated yesterday by Dr. Nemiah CommanderKalisetti. No changes in medications made at this point.   The patient will be discharged to [facility] today. The patient was clinically stable overnight, afebrile, and offers no complaints. The patient's potassium was slightly on the lower side this morning. Potassium supplementation was given. I do recommend checking a BMP on  Wednesday.   Again please see discharge summary dictated by Dr. Nemiah CommanderKalisetti yesterday.    ____________________________ Herschell Dimesichard J. Renae GlossWieting, MD rjw:dm D: 01/03/2013 09:38:10 ET T: 01/03/2013 09:50:06 ET JOB#: [id number]  cc: Herschell Dimesichard J. Renae GlossWieting, MD, <Dictator> Reola MosherAndrew S. Randa LynnLamb, MD Salley ScarletICHARD J Markon Jares MD ELECTRONICALLY SIGNED 01/05/2013 13:30

## 2015-01-06 NOTE — H&P (Signed)
PATIENT NAME:  Holly Drake, Holly Drake MR#:  [id number] DATE OF BIRTH:  Dec 26, 1923  DATE OF ADMISSION:  03/01/2014  PRIMARY CARE PHYSICIAN: The patient lives at  nursing home and is taken care of by the attending physician.    REFERRING PHYSICIAN: Dr. Manson PasseyBrown.   CHIEF COMPLAINT: Shortness of breath, fever.   HISTORY OF PRESENT ILLNESS: Holly Drake is a 81 year old, pleasant, white female with a history of dementia, depression, rheumatoid arthritis, chronic obstructive pulmonary disease has been not feeling well since Sunday with cough, shortness of breath. Symptoms gradually worsened with a fever of 102. The patient was given Tylenol with improvement of the fever. However, the patient's oxygen saturations were noted to be in 80s. Concerning this, the patient was sent to the Emergency Department. The patient has normal white blood cell count. Chest x-ray shows bibasilar airspace opacities. The patient received vancomycin and Zosyn. Considering the patient lives in an assisted living facility. The patient also received DuoNeb with improvement of the oxygen saturations. Unable to obtain any history from the patient. The history is mainly obtained from the patient's daughter. At baseline the patient is able to recognize the family members and is able to regular carry on small conversations.   PAST MEDICAL HISTORY: 1. Hypertension.  2. Hyperlipidemia.  3. Depression.  4. Rheumatoid arthritis.  5. Osteoporosis.  6. Dementia.  7. History of pelvic fracture.   ALLERGIES: No known drug allergies.   HOME MEDICATIONS: 1. Vitamin D3 2000 units once a day.  2. Sulfasalazine 500 mg 2 times a day.  3. Prednisone 60 mg on tapering dose.  4. Protonix 40 mg a day.  5. Norco 5/325 mg 1 to 2 tablets every six hours as needed.  6. Metoprolol 75 mg every 12 hours.  7. Losartan 100 mg once a day.  8. Lasix 20 mg once a day.  9. Citalopram 20 mg once a day.  10. Caltrate 600 mg once a day.  11. Amlodipine 10 mg once  daily.  12. Albuterol  DuoNeb 6 times a day as needed.  13. Advair Diskus 1 puff 2 times a day.   SOCIAL HISTORY: Quit smoking 25 years back. Currently lives in an assisted living facility.   FAMILY HISTORY: Could not be obtained from the patient.   REVIEW OF SYSTEMS: Could not be obtained as the patient is confused.   PHYSICAL EXAMINATION:  GENERAL: The patient  is a well-built, well-nourished  age-appropriate female lying down in the bed, not in distress.  VITAL SIGNS: Temperature 97.8, pulse 93, blood pressure 134/58, respiratory rate of 16, respiratory rate of 22, oxygen saturation 85% on 2 liters of oxygen.  HEENT: Head normocephalic, atraumatic, there is no scleral icterus. Conjunctivae normal. Pupils equal and react to light. Extraocular movements are intact. Mucous membranes moist. No pharyngeal erythema.  NECK: Supple. No lymphadenopathy. No JVD. No carotid bruit.  CHEST: Has no focal tenderness.  LUNGS: Bilaterally coarse breath sounds, bilateral  wheezing is heard.  HEART: S1, S2, regular, tachycardia.  ABDOMEN: Bowel sounds present. Soft, nontender, distended. No hepatosplenomegaly.  EXTREMITIES: No pedal edema. Pulses 2+.  SKIN: No rash or lesions.  MUSCULOSKELETAL: Good range of motion in all the extremities.  NEUROLOGICAL: The patient is oriented to self and daughter, but not to place and time. No apparent cranial nerve abnormalities.  Moving all four extremities.   LABORATORY DATA: CBC is completely within normal limits. Troponin less than 0.2. Chest x-ray shows bibasilar opacities. BMP, sodium 131. The rest of  the values are within normal limits. CK 69, CK-MB of 1.7.   ASSESSMENT AND PLAN: Holly Drake is a 81 year old female with a history of dementia and chronic obstructive pulmonary disease. Comes with pneumonia and chronic obstructive pulmonary disease exacerbation.  1. Pneumonia. The patient was treated briefly with the levofloxacin. The patient does not have any MRSA  colonization. We will continue with the levofloxacin IV.  2. Chronic obstructive pulmonary disease exacerbation: Continue with the breathing treatments, Solu-Medrol and antibiotics.  3. Debility: Will involve physical therapy, occupational therapy.  4. Hypertension: Currently well controlled.  5. Dementia. It is slightly worse secondary to infection. The patient's daughter is aware it is expected to get worse.  6. Keep the patient on deep vein thrombosis prophylaxis with Lovenox.   TIME SPENT: 50 minutes.    ____________________________ Susa Griffins, MD pv:sg D: 03/01/2014 05:54:36 ET T: 03/01/2014 06:08:51 ET JOB#: [id number]  cc: Susa Griffins, MD, <Dictator> Clerance Lav VASIREDDY MD ELECTRONICALLY SIGNED 03/09/2014 21:00

## 2015-01-06 NOTE — Discharge Summary (Signed)
PATIENT NAME:  Holly SearlesKEITH, Clemence E MR#:  [id number] DATE OF BIRTH:  1924/01/30  DATE OF ADMISSION:  03/01/2014 DATE OF DISCHARGE:  03/04/2014  DISCHARGE DIAGNOSES: Pneumonia, chronic obstructive pulmonary disease exacerbation,  hypertension, debility, and dementia.   CONDITION: Stable.   CODE STATUS: DO NOT RESUSCITATE.   HOME MEDICATIONS: Please refer to the medication reconciliation list.   DIET: Low sodium diet.   ACTIVITY: As tolerated.   FOLLOW-UP CARE: Follow with PCP within 1 to 2 weeks. In addition, the patient needs home oxygen 2 liters by nasal cannula, home health, and physical therapy.   REASON FOR ADMISSION: Shortness of breath and fever.   HOSPITAL COURSE:  1.  The patient is an 81 year old Caucasian female with a history of dementia, depression, and chronic obstructive pulmonary disease who presented to the ED with a fever of 102 band shortness of breath. The patient's oxygen saturation was in 80s. Chest x-ray showed bibasilar air space opacity.  Patient was treated with Vancomycin and Zosyn in the ED.  For a detailed history and physical examination, please refer to the admission note dictated by Dr.Vasireddy.  Laboratory data on admission date: CBC was  in normal range. Troponin was less than 0.02, sodium 131, otherwise, BMP was negative.  2.  Pneumonia. After admission, the patient has been treated with Levaquin IV. 3.  Chronic obstructive pulmonary disease exacerbation. The patient has been treated with nebulizer, Solu-Medrol, and Levaquin. The patient's symptoms has much improved. 4.  Debility.  The patient underwent physical therapy. The patient was recommended to have home health and PT.  5.  The patient's symptoms have much improved. She is clinically stable and will be discharged to home with home health and PT today. I discussed the patient's discharge plan with the patient's daughter, nurse, and case Production designer, theatre/television/filmmanager.   TIME SPENT: About 37 minutes.     ____________________________ Shaune PollackQing Chen, MD qc:ts D: 03/04/2014 14:07:57 ET T: 03/04/2014 15:48:45 ET JOB#: [id number]  cc: Shaune PollackQing Chen, MD, <Dictator> Shaune PollackQING CHEN MD ELECTRONICALLY SIGNED 03/05/2014 14:12

## 2015-02-15 ENCOUNTER — Emergency Department: Payer: Medicare Other

## 2015-02-15 ENCOUNTER — Inpatient Hospital Stay
Admission: EM | Admit: 2015-02-15 | Discharge: 2015-02-21 | DRG: 480 | Disposition: A | Discharge status: Skilled Nursing Facility | Payer: Medicare Other | Attending: Internal Medicine | Admitting: Internal Medicine

## 2015-02-15 ENCOUNTER — Encounter: Payer: Self-pay | Admitting: Emergency Medicine

## 2015-02-15 DIAGNOSIS — I10 Essential (primary) hypertension: Secondary | ICD-10-CM | POA: Diagnosis present

## 2015-02-15 DIAGNOSIS — F329 Major depressive disorder, single episode, unspecified: Secondary | ICD-10-CM | POA: Diagnosis present

## 2015-02-15 DIAGNOSIS — M1990 Unspecified osteoarthritis, unspecified site: Secondary | ICD-10-CM | POA: Diagnosis present

## 2015-02-15 DIAGNOSIS — Z7901 Long term (current) use of anticoagulants: Secondary | ICD-10-CM

## 2015-02-15 DIAGNOSIS — E873 Alkalosis: Secondary | ICD-10-CM | POA: Diagnosis not present

## 2015-02-15 DIAGNOSIS — Z86711 Personal history of pulmonary embolism: Secondary | ICD-10-CM

## 2015-02-15 DIAGNOSIS — Z791 Long term (current) use of non-steroidal anti-inflammatories (NSAID): Secondary | ICD-10-CM

## 2015-02-15 DIAGNOSIS — W51XXXA Accidental striking against or bumped into by another person, initial encounter: Secondary | ICD-10-CM | POA: Diagnosis present

## 2015-02-15 DIAGNOSIS — Y92129 Unspecified place in nursing home as the place of occurrence of the external cause: Secondary | ICD-10-CM

## 2015-02-15 DIAGNOSIS — Z8781 Personal history of (healed) traumatic fracture: Secondary | ICD-10-CM

## 2015-02-15 DIAGNOSIS — W19XXXA Unspecified fall, initial encounter: Secondary | ICD-10-CM

## 2015-02-15 DIAGNOSIS — J441 Chronic obstructive pulmonary disease with (acute) exacerbation: Secondary | ICD-10-CM | POA: Diagnosis present

## 2015-02-15 DIAGNOSIS — Z8711 Personal history of peptic ulcer disease: Secondary | ICD-10-CM

## 2015-02-15 DIAGNOSIS — Z6831 Body mass index (BMI) 31.0-31.9, adult: Secondary | ICD-10-CM

## 2015-02-15 DIAGNOSIS — E041 Nontoxic single thyroid nodule: Secondary | ICD-10-CM | POA: Diagnosis present

## 2015-02-15 DIAGNOSIS — I2699 Other pulmonary embolism without acute cor pulmonale: Secondary | ICD-10-CM | POA: Diagnosis not present

## 2015-02-15 DIAGNOSIS — G20 Parkinson's disease: Secondary | ICD-10-CM | POA: Diagnosis present

## 2015-02-15 DIAGNOSIS — J9811 Atelectasis: Secondary | ICD-10-CM | POA: Diagnosis not present

## 2015-02-15 DIAGNOSIS — R0602 Shortness of breath: Secondary | ICD-10-CM

## 2015-02-15 DIAGNOSIS — F419 Anxiety disorder, unspecified: Secondary | ICD-10-CM | POA: Diagnosis present

## 2015-02-15 DIAGNOSIS — S7223XA Displaced subtrochanteric fracture of unspecified femur, initial encounter for closed fracture: Secondary | ICD-10-CM | POA: Diagnosis not present

## 2015-02-15 DIAGNOSIS — J80 Acute respiratory distress syndrome: Secondary | ICD-10-CM | POA: Diagnosis not present

## 2015-02-15 DIAGNOSIS — S72009A Fracture of unspecified part of neck of unspecified femur, initial encounter for closed fracture: Secondary | ICD-10-CM | POA: Diagnosis present

## 2015-02-15 DIAGNOSIS — Z9889 Other specified postprocedural states: Secondary | ICD-10-CM

## 2015-02-15 DIAGNOSIS — Z7951 Long term (current) use of inhaled steroids: Secondary | ICD-10-CM

## 2015-02-15 DIAGNOSIS — S7221XA Displaced subtrochanteric fracture of right femur, initial encounter for closed fracture: Secondary | ICD-10-CM

## 2015-02-15 DIAGNOSIS — R0603 Acute respiratory distress: Secondary | ICD-10-CM

## 2015-02-15 DIAGNOSIS — Z79891 Long term (current) use of opiate analgesic: Secondary | ICD-10-CM

## 2015-02-15 DIAGNOSIS — F0390 Unspecified dementia without behavioral disturbance: Secondary | ICD-10-CM | POA: Diagnosis present

## 2015-02-15 DIAGNOSIS — Z87891 Personal history of nicotine dependence: Secondary | ICD-10-CM

## 2015-02-15 DIAGNOSIS — Z79899 Other long term (current) drug therapy: Secondary | ICD-10-CM

## 2015-02-15 DIAGNOSIS — K219 Gastro-esophageal reflux disease without esophagitis: Secondary | ICD-10-CM | POA: Diagnosis present

## 2015-02-15 DIAGNOSIS — Z86718 Personal history of other venous thrombosis and embolism: Secondary | ICD-10-CM

## 2015-02-15 DIAGNOSIS — Z419 Encounter for procedure for purposes other than remedying health state, unspecified: Secondary | ICD-10-CM

## 2015-02-15 DIAGNOSIS — Z820 Family history of epilepsy and other diseases of the nervous system: Secondary | ICD-10-CM

## 2015-02-15 DIAGNOSIS — Z9981 Dependence on supplemental oxygen: Secondary | ICD-10-CM

## 2015-02-15 DIAGNOSIS — Z66 Do not resuscitate: Secondary | ICD-10-CM | POA: Diagnosis not present

## 2015-02-15 HISTORY — DX: Unspecified osteoarthritis, unspecified site: M19.90

## 2015-02-15 HISTORY — DX: Essential (primary) hypertension: I10

## 2015-02-15 HISTORY — DX: Chronic obstructive pulmonary disease, unspecified: J44.9

## 2015-02-15 HISTORY — DX: Unspecified dementia, unspecified severity, without behavioral disturbance, psychotic disturbance, mood disturbance, and anxiety: F03.90

## 2015-02-15 LAB — CBC WITH DIFFERENTIAL/PLATELET
BASOS ABS: 0 10*3/uL (ref 0–0.1)
Basophils Relative: 1 %
Eosinophils Absolute: 0.1 10*3/uL (ref 0–0.7)
Eosinophils Relative: 1 %
HCT: 39.2 % (ref 35.0–47.0)
Hemoglobin: 12.7 g/dL (ref 12.0–16.0)
LYMPHS PCT: 19 %
Lymphs Abs: 1.2 10*3/uL (ref 1.0–3.6)
MCH: 28.9 pg (ref 26.0–34.0)
MCHC: 32.4 g/dL (ref 32.0–36.0)
MCV: 89.1 fL (ref 80.0–100.0)
MONO ABS: 0.8 10*3/uL (ref 0.2–0.9)
Monocytes Relative: 12 %
NEUTROS ABS: 4.2 10*3/uL (ref 1.4–6.5)
Neutrophils Relative %: 67 %
PLATELETS: 214 10*3/uL (ref 150–440)
RBC: 4.4 MIL/uL (ref 3.80–5.20)
RDW: 13.6 % (ref 11.5–14.5)
WBC: 6.3 10*3/uL (ref 3.6–11.0)

## 2015-02-15 LAB — COMPREHENSIVE METABOLIC PANEL
ALT: 14 U/L (ref 14–54)
AST: 23 U/L (ref 15–41)
Albumin: 4 g/dL (ref 3.5–5.0)
Alkaline Phosphatase: 87 U/L (ref 38–126)
Anion gap: 11 (ref 5–15)
BUN: 15 mg/dL (ref 6–20)
CO2: 33 mmol/L — AB (ref 22–32)
Calcium: 9 mg/dL (ref 8.9–10.3)
Chloride: 90 mmol/L — ABNORMAL LOW (ref 101–111)
Creatinine, Ser: 0.71 mg/dL (ref 0.44–1.00)
GFR calc Af Amer: >60 mL/min (ref >60)
GFR calc non Af Amer: >60 mL/min (ref >60)
Glucose, Bld: 147 mg/dL — ABNORMAL HIGH (ref 65–99)
POTASSIUM: 3.9 mmol/L (ref 3.5–5.1)
Sodium: 134 mmol/L — ABNORMAL LOW (ref 135–145)
Total Bilirubin: 0.4 mg/dL (ref 0.3–1.2)
Total Protein: 6.5 g/dL (ref 6.5–8.1)

## 2015-02-15 LAB — APTT: APTT: 24 s (ref 24–36)

## 2015-02-15 LAB — PROTIME-INR
INR: 0.99
Prothrombin Time: 13.3 seconds (ref 11.4–15.0)

## 2015-02-15 LAB — TROPONIN I: Troponin I: <0.03 ng/mL (ref <0.031)

## 2015-02-15 LAB — CK: CK TOTAL: 69 U/L (ref 38–234)

## 2015-02-15 MED ORDER — ONDANSETRON HCL 4 MG/2ML IJ SOLN
INTRAMUSCULAR | Status: AC
  Administered 2015-02-15: 4 mg via INTRAVENOUS
  Filled 2015-02-15: qty 2

## 2015-02-15 MED ORDER — METHOCARBAMOL 500 MG PO TABS
500.0000 mg | ORAL_TABLET | Freq: Four times a day (QID) | ORAL | Status: DC | PRN
  Filled 2015-02-15: qty 1

## 2015-02-15 MED ORDER — MORPHINE SULFATE 2 MG/ML IJ SOLN
0.5000 mg | INTRAMUSCULAR | Status: DC | PRN
Start: 2015-02-15 — End: 2015-02-21
  Administered 2015-02-16 (×3): 0.5 mg via INTRAVENOUS
  Filled 2015-02-15 (×3): qty 1

## 2015-02-15 MED ORDER — HYDROCODONE-ACETAMINOPHEN 5-325 MG PO TABS: 1.0000 | ORAL_TABLET | Freq: Four times a day (QID) | ORAL | Status: DC | PRN

## 2015-02-15 MED ORDER — MORPHINE SULFATE 4 MG/ML IJ SOLN
INTRAMUSCULAR | Status: AC
  Administered 2015-02-15: 4 mg via INTRAVENOUS
  Filled 2015-02-15: qty 1

## 2015-02-15 MED ORDER — MORPHINE SULFATE 4 MG/ML IJ SOLN
4.0000 mg | Freq: Once | INTRAMUSCULAR | Status: AC
  Administered 2015-02-15: 4 mg via INTRAVENOUS

## 2015-02-15 MED ORDER — CEFAZOLIN SODIUM 1-5 GM-% IV SOLN
1.0000 g | Freq: Once | INTRAVENOUS | Status: DC
  Filled 2015-02-15: qty 50

## 2015-02-15 MED ORDER — ONDANSETRON HCL 4 MG/2ML IJ SOLN
4.0000 mg | Freq: Once | INTRAMUSCULAR | Status: AC
  Administered 2015-02-15: 4 mg via INTRAVENOUS

## 2015-02-15 MED ORDER — SODIUM CHLORIDE 0.9 % IV BOLUS (SEPSIS)
500.0000 mL | Freq: Once | INTRAVENOUS | Status: AC
  Administered 2015-02-15: 500 mL via INTRAVENOUS

## 2015-02-15 MED ORDER — DEXTROSE 5 % IV SOLN: 500.0000 mg | Freq: Four times a day (QID) | INTRAVENOUS | Status: DC | PRN
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
## 2015-02-15 NOTE — ED Notes (Signed)
Patient transported to X-ray 

## 2015-02-15 NOTE — Consult Note (Signed)
Comminuted subtrochanteric hip fracture on right Will need long IM nail, possibly cerclage wire. Plan surgery tomorrow if medically stable

## 2015-02-16 ENCOUNTER — Inpatient Hospital Stay: Payer: Medicare Other

## 2015-02-16 ENCOUNTER — Encounter
Admission: EM | Disposition: A | Discharge status: Skilled Nursing Facility | Payer: Self-pay | Source: Home / Self Care | Attending: Internal Medicine

## 2015-02-16 ENCOUNTER — Inpatient Hospital Stay: Payer: Medicare Other | Admitting: Anesthesiology

## 2015-02-16 ENCOUNTER — Encounter: Payer: Self-pay | Admitting: Internal Medicine

## 2015-02-16 ENCOUNTER — Encounter
Admission: RE | Admit: 2015-02-16 | Discharge: 2015-02-16 | Disposition: A | Discharge status: Home / Self Care | Payer: Medicare Other | Source: Ambulatory Visit | Attending: Internal Medicine | Admitting: Internal Medicine

## 2015-02-16 DIAGNOSIS — F329 Major depressive disorder, single episode, unspecified: Secondary | ICD-10-CM | POA: Diagnosis present

## 2015-02-16 DIAGNOSIS — J80 Acute respiratory distress syndrome: Secondary | ICD-10-CM | POA: Diagnosis not present

## 2015-02-16 DIAGNOSIS — Z86718 Personal history of other venous thrombosis and embolism: Secondary | ICD-10-CM | POA: Diagnosis not present

## 2015-02-16 DIAGNOSIS — Z7901 Long term (current) use of anticoagulants: Secondary | ICD-10-CM | POA: Diagnosis not present

## 2015-02-16 DIAGNOSIS — F0390 Unspecified dementia without behavioral disturbance: Secondary | ICD-10-CM | POA: Diagnosis present

## 2015-02-16 DIAGNOSIS — Z820 Family history of epilepsy and other diseases of the nervous system: Secondary | ICD-10-CM | POA: Diagnosis not present

## 2015-02-16 DIAGNOSIS — Z9981 Dependence on supplemental oxygen: Secondary | ICD-10-CM | POA: Diagnosis not present

## 2015-02-16 DIAGNOSIS — K219 Gastro-esophageal reflux disease without esophagitis: Secondary | ICD-10-CM | POA: Diagnosis present

## 2015-02-16 DIAGNOSIS — I2782 Chronic pulmonary embolism: Secondary | ICD-10-CM | POA: Diagnosis not present

## 2015-02-16 DIAGNOSIS — I2699 Other pulmonary embolism without acute cor pulmonale: Secondary | ICD-10-CM | POA: Diagnosis not present

## 2015-02-16 DIAGNOSIS — S72009A Fracture of unspecified part of neck of unspecified femur, initial encounter for closed fracture: Secondary | ICD-10-CM | POA: Diagnosis present

## 2015-02-16 DIAGNOSIS — Z6831 Body mass index (BMI) 31.0-31.9, adult: Secondary | ICD-10-CM | POA: Diagnosis not present

## 2015-02-16 DIAGNOSIS — F419 Anxiety disorder, unspecified: Secondary | ICD-10-CM | POA: Diagnosis present

## 2015-02-16 DIAGNOSIS — G20 Parkinson's disease: Secondary | ICD-10-CM | POA: Diagnosis present

## 2015-02-16 DIAGNOSIS — I4891 Unspecified atrial fibrillation: Secondary | ICD-10-CM | POA: Insufficient documentation

## 2015-02-16 DIAGNOSIS — I10 Essential (primary) hypertension: Secondary | ICD-10-CM | POA: Diagnosis present

## 2015-02-16 DIAGNOSIS — Z86711 Personal history of pulmonary embolism: Secondary | ICD-10-CM | POA: Diagnosis not present

## 2015-02-16 DIAGNOSIS — Z66 Do not resuscitate: Secondary | ICD-10-CM | POA: Diagnosis not present

## 2015-02-16 DIAGNOSIS — E873 Alkalosis: Secondary | ICD-10-CM | POA: Diagnosis not present

## 2015-02-16 DIAGNOSIS — J9811 Atelectasis: Secondary | ICD-10-CM | POA: Diagnosis not present

## 2015-02-16 DIAGNOSIS — Z7951 Long term (current) use of inhaled steroids: Secondary | ICD-10-CM | POA: Diagnosis not present

## 2015-02-16 DIAGNOSIS — R0600 Dyspnea, unspecified: Secondary | ICD-10-CM | POA: Diagnosis not present

## 2015-02-16 DIAGNOSIS — M1990 Unspecified osteoarthritis, unspecified site: Secondary | ICD-10-CM | POA: Diagnosis present

## 2015-02-16 DIAGNOSIS — R32 Unspecified urinary incontinence: Secondary | ICD-10-CM | POA: Insufficient documentation

## 2015-02-16 DIAGNOSIS — J441 Chronic obstructive pulmonary disease with (acute) exacerbation: Secondary | ICD-10-CM | POA: Diagnosis present

## 2015-02-16 DIAGNOSIS — Z791 Long term (current) use of non-steroidal anti-inflammatories (NSAID): Secondary | ICD-10-CM | POA: Diagnosis not present

## 2015-02-16 DIAGNOSIS — Z8711 Personal history of peptic ulcer disease: Secondary | ICD-10-CM | POA: Diagnosis not present

## 2015-02-16 DIAGNOSIS — E041 Nontoxic single thyroid nodule: Secondary | ICD-10-CM | POA: Diagnosis present

## 2015-02-16 DIAGNOSIS — I509 Heart failure, unspecified: Secondary | ICD-10-CM | POA: Insufficient documentation

## 2015-02-16 DIAGNOSIS — J449 Chronic obstructive pulmonary disease, unspecified: Secondary | ICD-10-CM | POA: Insufficient documentation

## 2015-02-16 DIAGNOSIS — Z79899 Other long term (current) drug therapy: Secondary | ICD-10-CM | POA: Diagnosis not present

## 2015-02-16 DIAGNOSIS — Z87891 Personal history of nicotine dependence: Secondary | ICD-10-CM | POA: Diagnosis not present

## 2015-02-16 DIAGNOSIS — Z79891 Long term (current) use of opiate analgesic: Secondary | ICD-10-CM | POA: Diagnosis not present

## 2015-02-16 DIAGNOSIS — S7223XA Displaced subtrochanteric fracture of unspecified femur, initial encounter for closed fracture: Secondary | ICD-10-CM | POA: Diagnosis present

## 2015-02-16 HISTORY — PX: FEMUR IM NAIL: SHX1597

## 2015-02-16 LAB — CBC
HCT: 33.1 % — ABNORMAL LOW (ref 35.0–47.0)
HCT: 33.6 % — ABNORMAL LOW (ref 35.0–47.0)
Hemoglobin: 10.7 g/dL — ABNORMAL LOW (ref 12.0–16.0)
Hemoglobin: 11.1 g/dL — ABNORMAL LOW (ref 12.0–16.0)
MCH: 29.2 pg (ref 26.0–34.0)
MCH: 29.9 pg (ref 26.0–34.0)
MCHC: 32.5 g/dL (ref 32.0–36.0)
MCHC: 33 g/dL (ref 32.0–36.0)
MCV: 90 fL (ref 80.0–100.0)
MCV: 90.5 fL (ref 80.0–100.0)
PLATELETS: 186 10*3/uL (ref 150–440)
Platelets: 181 10*3/uL (ref 150–440)
RBC: 3.68 MIL/uL — ABNORMAL LOW (ref 3.80–5.20)
RBC: 3.72 MIL/uL — AB (ref 3.80–5.20)
RDW: 13.5 % (ref 11.5–14.5)
RDW: 13.2 % (ref 11.5–14.5)
WBC: 9.8 10*3/uL (ref 3.6–11.0)
WBC: 10.9 10*3/uL (ref 3.6–11.0)

## 2015-02-16 LAB — PROTIME-INR
INR: 1.07
Prothrombin Time: 14.1 seconds (ref 11.4–15.0)

## 2015-02-16 LAB — CREATININE, SERUM
Creatinine, Ser: 0.77 mg/dL (ref 0.44–1.00)
GFR calc Af Amer: >60 mL/min (ref >60)
GFR calc non Af Amer: >60 mL/min (ref >60)

## 2015-02-16 LAB — TSH: TSH: 1.69 u[IU]/mL (ref 0.350–4.500)

## 2015-02-16 SURGERY — INSERTION, INTRAMEDULLARY ROD, FEMUR
Anesthesia: Spinal | Site: Hip | Laterality: Right | Wound class: Clean

## 2015-02-16 MED ORDER — SODIUM FLUORIDE 1.1 % DT CREA
1.0000 "application " | TOPICAL_CREAM | Freq: Three times a day (TID) | DENTAL | Status: DC
  Administered 2015-02-16: 1 via DENTAL

## 2015-02-16 MED ORDER — HEPARIN SODIUM (PORCINE) 5000 UNIT/ML IJ SOLN: 5000.0000 [IU] | Freq: Three times a day (TID) | INTRAMUSCULAR | Status: DC

## 2015-02-16 MED ORDER — LACTATED RINGERS IV SOLN
INTRAVENOUS | Status: DC | PRN
  Administered 2015-02-16 (×2): via INTRAVENOUS

## 2015-02-16 MED ORDER — METOCLOPRAMIDE HCL 5 MG/ML IJ SOLN: 5.0000 mg | Freq: Three times a day (TID) | INTRAMUSCULAR | Status: DC | PRN

## 2015-02-16 MED ORDER — MORPHINE SULFATE 2 MG/ML IJ SOLN: 0.5000 mg | INTRAMUSCULAR | Status: DC | PRN

## 2015-02-16 MED ORDER — AMLODIPINE BESYLATE 10 MG PO TABS
10.0000 mg | ORAL_TABLET | Freq: Every day | ORAL | Status: DC
  Administered 2015-02-17 – 2015-02-21 (×5): 10 mg via ORAL
  Filled 2015-02-16 (×4): qty 1

## 2015-02-16 MED ORDER — CEFAZOLIN SODIUM-DEXTROSE 2-3 GM-% IV SOLR
2.0000 g | Freq: Four times a day (QID) | INTRAVENOUS | Status: AC
  Administered 2015-02-16 – 2015-02-17 (×3): 2 g via INTRAVENOUS
  Filled 2015-02-16 (×3): qty 50

## 2015-02-16 MED ORDER — CALCIUM CARBONATE 600 MG PO TABS: 600.0000 mg | ORAL_TABLET | Freq: Every day | ORAL | Status: DC

## 2015-02-16 MED ORDER — CALCIUM CARBONATE ANTACID 500 MG PO CHEW
1.0000 | CHEWABLE_TABLET | Freq: Every day | ORAL | Status: DC
  Administered 2015-02-17 – 2015-02-21 (×5): 200 mg via ORAL
  Filled 2015-02-16 (×5): qty 1

## 2015-02-16 MED ORDER — PREDNISONE 50 MG PO TABS
50.0000 mg | ORAL_TABLET | Freq: Every day | ORAL | Status: DC
  Administered 2015-02-17 – 2015-02-18 (×2): 50 mg via ORAL
  Filled 2015-02-16 (×2): qty 1

## 2015-02-16 MED ORDER — SULFASALAZINE 500 MG PO TABS
500.0000 mg | ORAL_TABLET | Freq: Two times a day (BID) | ORAL | Status: DC
  Administered 2015-02-16 – 2015-02-21 (×10): 500 mg via ORAL
  Filled 2015-02-16 (×13): qty 1

## 2015-02-16 MED ORDER — MOMETASONE FURO-FORMOTEROL FUM 100-5 MCG/ACT IN AERO
2.0000 | INHALATION_SPRAY | Freq: Two times a day (BID) | RESPIRATORY_TRACT | Status: DC
  Administered 2015-02-16 – 2015-02-21 (×10): 2 via RESPIRATORY_TRACT
  Filled 2015-02-16: qty 8.8

## 2015-02-16 MED ORDER — ENOXAPARIN SODIUM 30 MG/0.3ML ~~LOC~~ SOLN
30.0000 mg | SUBCUTANEOUS | Status: DC
  Administered 2015-02-17 – 2015-02-19 (×3): 30 mg via SUBCUTANEOUS
  Filled 2015-02-16 (×3): qty 0.3

## 2015-02-16 MED ORDER — MAGNESIUM CITRATE PO SOLN
1.0000 | Freq: Once | ORAL | Status: AC | PRN
  Filled 2015-02-16: qty 296

## 2015-02-16 MED ORDER — CHLORHEXIDINE GLUCONATE 0.12 % MT SOLN
15.0000 mL | Freq: Three times a day (TID) | OROMUCOSAL | Status: DC
  Administered 2015-02-16 – 2015-02-21 (×10): 15 mL via OROMUCOSAL

## 2015-02-16 MED ORDER — KETAMINE HCL 50 MG/ML IJ SOLN
INTRAMUSCULAR | Status: DC | PRN
  Administered 2015-02-16 (×2): 25 mg via INTRAMUSCULAR

## 2015-02-16 MED ORDER — WARFARIN SODIUM 2.5 MG PO TABS
5.0000 mg | ORAL_TABLET | Freq: Every day | ORAL | Status: DC
  Administered 2015-02-16 – 2015-02-17 (×2): 5 mg via ORAL
  Filled 2015-02-16 (×3): qty 2

## 2015-02-16 MED ORDER — PROPOFOL INFUSION 10 MG/ML OPTIME
INTRAVENOUS | Status: DC | PRN
  Administered 2015-02-16: 40 ug/kg/min via INTRAVENOUS

## 2015-02-16 MED ORDER — MIDAZOLAM HCL 5 MG/5ML IJ SOLN
INTRAMUSCULAR | Status: DC | PRN
  Administered 2015-02-16: 1 mg via INTRAVENOUS

## 2015-02-16 MED ORDER — FUROSEMIDE 20 MG PO TABS
20.0000 mg | ORAL_TABLET | ORAL | Status: DC
  Administered 2015-02-18: 20 mg via ORAL
  Filled 2015-02-16: qty 1

## 2015-02-16 MED ORDER — LOSARTAN POTASSIUM 50 MG PO TABS
100.0000 mg | ORAL_TABLET | Freq: Every day | ORAL | Status: DC
  Administered 2015-02-17 – 2015-02-21 (×5): 100 mg via ORAL
  Filled 2015-02-16 (×6): qty 2

## 2015-02-16 MED ORDER — PANTOPRAZOLE SODIUM 40 MG PO TBEC
40.0000 mg | DELAYED_RELEASE_TABLET | Freq: Every day | ORAL | Status: DC
  Administered 2015-02-17 – 2015-02-21 (×5): 40 mg via ORAL
  Filled 2015-02-16 (×5): qty 1

## 2015-02-16 MED ORDER — ALUM & MAG HYDROXIDE-SIMETH 200-200-20 MG/5ML PO SUSP: 30.0000 mL | ORAL | Status: DC | PRN

## 2015-02-16 MED ORDER — CEFAZOLIN (ANCEF) 1 G IV SOLR
INTRAVENOUS | Status: DC | PRN
  Administered 2015-02-16: 1 g via INTRAMUSCULAR

## 2015-02-16 MED ORDER — PHENOL 1.4 % MT LIQD
1.0000 | OROMUCOSAL | Status: DC | PRN
  Filled 2015-02-16: qty 177

## 2015-02-16 MED ORDER — NEOMYCIN-POLYMYXIN B GU 40-200000 IR SOLN
Status: AC
  Filled 2015-02-16: qty 2

## 2015-02-16 MED ORDER — ACETAMINOPHEN 650 MG RE SUPP: 650.0000 mg | Freq: Four times a day (QID) | RECTAL | Status: DC | PRN

## 2015-02-16 MED ORDER — METOPROLOL TARTRATE 50 MG PO TABS
75.0000 mg | ORAL_TABLET | Freq: Two times a day (BID) | ORAL | Status: DC
  Administered 2015-02-16 – 2015-02-21 (×10): 75 mg via ORAL
  Filled 2015-02-16 (×6): qty 1
  Filled 2015-02-16: qty 2
  Filled 2015-02-16 (×6): qty 1

## 2015-02-16 MED ORDER — DOCUSATE SODIUM 100 MG PO CAPS
100.0000 mg | ORAL_CAPSULE | Freq: Two times a day (BID) | ORAL | Status: DC
  Administered 2015-02-16 – 2015-02-20 (×9): 100 mg via ORAL
  Filled 2015-02-16 (×10): qty 1

## 2015-02-16 MED ORDER — METOCLOPRAMIDE HCL 10 MG PO TABS: 5.0000 mg | ORAL_TABLET | Freq: Three times a day (TID) | ORAL | Status: DC | PRN

## 2015-02-16 MED ORDER — FENTANYL CITRATE (PF) 100 MCG/2ML IJ SOLN: 25.0000 ug | INTRAMUSCULAR | Status: DC | PRN

## 2015-02-16 MED ORDER — CITALOPRAM HYDROBROMIDE 20 MG PO TABS
20.0000 mg | ORAL_TABLET | Freq: Every day | ORAL | Status: DC
  Administered 2015-02-16 – 2015-02-20 (×5): 20 mg via ORAL
  Filled 2015-02-16 (×7): qty 1

## 2015-02-16 MED ORDER — PHENYLEPHRINE HCL 10 MG/ML IJ SOLN
INTRAMUSCULAR | Status: DC | PRN
  Administered 2015-02-16: 200 ug via INTRAVENOUS
  Administered 2015-02-16: 300 ug via INTRAVENOUS
  Administered 2015-02-16 (×2): 200 ug via INTRAVENOUS
  Administered 2015-02-16 (×3): 300 ug via INTRAVENOUS
  Administered 2015-02-16 (×2): 200 ug via INTRAVENOUS
  Administered 2015-02-16: 300 ug via INTRAVENOUS

## 2015-02-16 MED ORDER — BISACODYL 10 MG RE SUPP: 10.0000 mg | Freq: Every day | RECTAL | Status: DC | PRN

## 2015-02-16 MED ORDER — FENTANYL CITRATE (PF) 100 MCG/2ML IJ SOLN
INTRAMUSCULAR | Status: DC | PRN
  Administered 2015-02-16: 50 ug via INTRAVENOUS

## 2015-02-16 MED ORDER — SODIUM CHLORIDE 0.9 % IV SOLN
INTRAVENOUS | Status: DC
Start: 2015-02-16 — End: 2015-02-17
  Administered 2015-02-16 – 2015-02-17 (×2): via INTRAVENOUS

## 2015-02-16 MED ORDER — MAGNESIUM HYDROXIDE 400 MG/5ML PO SUSP
30.0000 mL | Freq: Every day | ORAL | Status: DC | PRN
  Administered 2015-02-18: 30 mL via ORAL
  Filled 2015-02-16: qty 30

## 2015-02-16 MED ORDER — HYDROCODONE-ACETAMINOPHEN 5-325 MG PO TABS
1.0000 | ORAL_TABLET | Freq: Four times a day (QID) | ORAL | Status: DC | PRN
  Administered 2015-02-16 – 2015-02-18 (×5): 1 via ORAL
  Filled 2015-02-16 (×5): qty 1

## 2015-02-16 MED ORDER — ACETAMINOPHEN 325 MG PO TABS: 650.0000 mg | ORAL_TABLET | Freq: Four times a day (QID) | ORAL | Status: DC | PRN

## 2015-02-16 MED ORDER — BUPIVACAINE HCL (PF) 0.5 % IJ SOLN
INTRAMUSCULAR | Status: DC | PRN
  Administered 2015-02-16: 3 mL

## 2015-02-16 MED ORDER — MENTHOL 3 MG MT LOZG
1.0000 | LOZENGE | OROMUCOSAL | Status: DC | PRN
  Filled 2015-02-16: qty 9

## 2015-02-16 MED ORDER — ONDANSETRON HCL 4 MG PO TABS: 4.0000 mg | ORAL_TABLET | Freq: Four times a day (QID) | ORAL | Status: DC | PRN

## 2015-02-16 MED ORDER — VITAMIN D 1000 UNITS PO TABS
2000.0000 [IU] | ORAL_TABLET | ORAL | Status: DC
  Administered 2015-02-19: 2000 [IU] via ORAL
  Filled 2015-02-16: qty 2

## 2015-02-16 MED ORDER — ONDANSETRON HCL 4 MG/2ML IJ SOLN
4.0000 mg | Freq: Four times a day (QID) | INTRAMUSCULAR | Status: DC | PRN
  Administered 2015-02-21: 4 mg via INTRAVENOUS
  Filled 2015-02-16: qty 2

## 2015-02-16 MED ORDER — ONDANSETRON HCL 4 MG/2ML IJ SOLN: 4.0000 mg | Freq: Once | INTRAMUSCULAR | Status: DC | PRN

## 2015-02-16 SURGICAL SUPPLY — 35 items
BIT DRILL 4.3MMS DISTAL GRDTED (BIT) ×1 IMPLANT
CANISTER SUCT 1200ML W/VALVE (MISCELLANEOUS) ×3 IMPLANT
CHLORAPREP W/TINT 26ML (MISCELLANEOUS) ×3 IMPLANT
DRAPE SHEET LG 3/4 BI-LAMINATE (DRAPES) ×3 IMPLANT
DRAPE SURG 17X11 SM STRL (DRAPES) ×3 IMPLANT
DRAPE U-SHAPE 47X51 STRL (DRAPES) IMPLANT
DRILL 4.3MMS DISTAL GRADUATED (BIT) ×3
ELECT CAUTERY BLADE 6.4 (BLADE) ×3 IMPLANT
GAUZE SPONGE 4X4 12PLY STRL (GAUZE/BANDAGES/DRESSINGS) ×3 IMPLANT
GLOVE BIOGEL PI IND STRL 9 (GLOVE) ×2 IMPLANT
GLOVE BIOGEL PI INDICATOR 9 (GLOVE) ×4
GLOVE SURG ORTHO 9.0 STRL STRW (GLOVE) ×6 IMPLANT
GOWN SPECIALTY ULTRA XL (MISCELLANEOUS) ×3 IMPLANT
GOWN STRL REUS W/ TWL LRG LVL3 (GOWN DISPOSABLE) ×1 IMPLANT
GOWN STRL REUS W/TWL LRG LVL3 (GOWN DISPOSABLE) ×2
GOWN STRL REUS W/TWL LRG LVL4 (GOWN DISPOSABLE) ×3 IMPLANT
GUIDEPIN VERSANAIL DSP 3.2X444 ×3 IMPLANT
GUIDEWIRE BALL NOSE 80CM (WIRE) ×3 IMPLANT
HFN LAG SCREW 10.5MM X 115MM (Orthopedic Implant) ×3 IMPLANT
HFN RH 130 DEG 11MM X 360MM (Orthopedic Implant) ×3 IMPLANT
IV NS 500ML (IV SOLUTION) ×2
IV NS 500ML BAXH (IV SOLUTION) ×1 IMPLANT
KIT RM TURNOVER STRD PROC AR (KITS) ×3 IMPLANT
MAT BLUE FLOOR 46X72 FLO (MISCELLANEOUS) ×3 IMPLANT
NEEDLE FILTER BLUNT 18X 1/2SAF (NEEDLE) ×2
NEEDLE FILTER BLUNT 18X1 1/2 (NEEDLE) ×1 IMPLANT
PACK HIP COMPR (MISCELLANEOUS) ×3 IMPLANT
PAD GROUND ADULT SPLIT (MISCELLANEOUS) ×3 IMPLANT
SCREW BONE CORTICAL 5.0X42 (Screw) ×3 IMPLANT
STAPLER SKIN PROX 35W (STAPLE) ×3 IMPLANT
SUT VIC AB 1 CT1 36 (SUTURE) ×3 IMPLANT
SUT VIC AB 2-0 CT1 (SUTURE) ×3 IMPLANT
SYRINGE 10CC LL (SYRINGE) ×3 IMPLANT
TAPE MICROFOAM 4IN (TAPE) ×3 IMPLANT
THREADED GUIDE WIRE ×3 IMPLANT

## 2015-02-16 NOTE — Care Management Note (Signed)
Case Management Note  Patient Details  Name: Holly Drake E Marina MRN: [id number] Date of Birth: 01/02/1924  Subjective/Objective:                  Patient resting in bed and did not answer any questions related to [facility]. Her daughter is at bedside Rosey Bath(Teresa) and states that she is also HCPOA. [facility]. She states she is primarily in a wheelchair and unable to walk very far without assistance. She never clearly stated if patient had a rolling walker or not although I asked twice. She is chronic on O2 but does not recall name of facility. Rosey Batheresa is open to whatever PT recommends for discharge. She is aware that [facility] and agrees. If patient needs rehab at SNF [facility] second choice is [facility]. PCP is [facility].  Action/Plan: RNCM to continue to follow for home health needs.   Expected Discharge Date:                  Expected Discharge Plan:     In-House Referral:  Clinical Social Work  Discharge planning Services  CM Consult  Post Acute Care Choice:    Choice offered to:  Adult Children  DME Arranged:    DME Agency:     HH Arranged:    HH Agency:     Status of Service:     Medicare Important Message Given:  Yes Date Medicare IM Given:  02/16/15 Medicare IM give by:  Collie SiadAngela Delmas Faucett Date Additional Medicare IM Given:    Additional Medicare Important Message give by:     If discussed at Long Length of Stay Meetings, dates discussed:    Additional Comments:  Collie Siadngela Jessiah Steinhart, RN 02/16/2015, 9:43 AM

## 2015-02-16 NOTE — Progress Notes (Addendum)
Per Selena BattenKim admissions coordinator at [facility] can [facility] patient on Monday. Clinical Child psychotherapistocial Worker (CSW) left a Engineer, technical salesvoicemail for Rosey Batheresa patient's daughter making her aware of above. CSW will continue to follow and assist as needed.  Jetta LoutBailey Morgan, LCSWA [phone]

## 2015-02-16 NOTE — Progress Notes (Signed)
Called Dr. Rosita KeaMenz re: patients BP of 115/47. Per verbal instructions, increase fluids from 4775mL/hr to 14900mL/hr for tonight. Resume 5875mL/hr tomorrow morning.
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
## 2015-02-16 NOTE — Clinical Social Work Placement (Signed)
   CLINICAL SOCIAL WORK PLACEMENT  NOTE  Date:  02/16/2015  Patient Details  Name: Holly Drake MRN: [id number] Date of Birth: 04/10/1924  Clinical Social Work is seeking post-discharge placement for this patient at the Skilled  Nursing Facility level of care (*CSW will initial, date and re-position this form in  chart as items are completed):  Yes   Patient/family provided with [place] Clinical Social Work Department's list of facilities offering this level of care within the geographic area requested by the patient (or if unable, by the patient's family).  Yes   Patient/family informed of their freedom to choose among providers that offer the needed level of care, that participate in Medicare, Medicaid or managed care program needed by the patient, have an available bed and are willing to accept the patient.  Yes   Patient/family informed of [facility]'s ownership interest in [facility] and [facility], as well as of the fact that they are under no obligation to receive care at these facilities.  PASRR submitted to EDS on 02/16/15     PASRR number received on       Existing PASRR number confirmed on       FL2 transmitted to all facilities in geographic area requested by pt/family on 02/16/15     FL2 transmitted to all facilities within larger geographic area on       Patient informed that his/her managed care company has contracts with or will negotiate with certain facilities, including the following:            Patient/family informed of bed offers received.  Patient chooses bed at       Physician recommends and patient chooses bed at      Patient to be transferred to   on  .  Patient to be transferred to facility by       Patient family notified on   of transfer.  Name of family member notified:        PHYSICIAN Please sign FL2     Additional Comment:    _______________________________________________ [name], [name], LCSW 02/16/2015, 11:02 AM

## 2015-02-16 NOTE — Clinical Social Work Placement (Signed)
   CLINICAL SOCIAL WORK PLACEMENT  NOTE  Date:  02/16/2015  Patient Details  Name: Holly Drake MRN: [id number] Date of Birth: 03/26/1924  Clinical Social Work is seeking post-discharge placement for this patient at the Skilled  Nursing Facility level of care (*CSW will initial, date and re-position this form in  chart as items are completed):  Yes   Patient/family provided with [facility] Social Work Department's list of facilities offering this level of care within the geographic area requested by the patient (or if unable, by the patient's family).  Yes   Patient/family informed of their freedom to choose among providers that offer the needed level of care, that participate in Medicare, Medicaid or managed care program needed by the patient, have an available bed and are willing to accept the patient.  Yes   Patient/family informed of [facility]'s ownership interest in [facility] and [facility], as well as of the fact that they are under no obligation to receive care at these facilities.  PASRR submitted to EDS on 02/16/15     PASRR number received on 02/16/15     Existing PASRR number confirmed on       FL2 transmitted to all facilities in geographic area requested by pt/family on 02/16/15     FL2 transmitted to all facilities within larger geographic area on       Patient informed that his/her managed care company has contracts with or will negotiate with certain facilities, including the following:            Patient/family informed of bed offers received.  Patient chooses bed at       Physician recommends and patient chooses bed at      Patient to be transferred to   on  .  Patient to be transferred to facility by       Patient family notified on   of transfer.  Name of family member notified:        PHYSICIAN Please sign FL2     Additional Comment:    _______________________________________________ [name], Leatrice Parilla G, LCSW 02/16/2015, 3:49  PM

## 2015-02-16 NOTE — Anesthesia Procedure Notes (Addendum)
Spinal Patient location during procedure: OR Start time: 02/16/2015 11:30 AM End time: 02/16/2015 11:51 AM Staffing Anesthesiologist: Gunnar Fusi Resident/CRNA: Doreen Salvage Performed by: anesthesiologist and resident/CRNA  Preanesthetic Checklist Completed: patient identified, site marked, surgical consent, pre-op evaluation, timeout performed, IV checked, risks and benefits discussed and monitors and equipment checked Spinal Block Patient position: left lateral decubitus Prep: Betadine Patient monitoring: heart rate, continuous pulse ox, blood pressure and cardiac monitor Approach: midline Location: L3-4 Injection technique: single-shot Needle Needle type: Whitacre and Introducer  Needle gauge: 25 G Needle length: 9 cm Additional Notes X 2 attempts- Negative paresthesia. Negative blood return. Positive free-flowing CSF. Expiration date of kit checked and confirmed. Patient tolerated procedure well, without complications.    Performed by: Doreen Salvage Oxygen Delivery Method: Simple face mask

## 2015-02-16 NOTE — Progress Notes (Signed)
Clinical Education officer, museum (CSW) met with patient's daughter at bedside and made her aware that [facility] has extended a bed offer. Daughter accepted offer. Plan is for patient to D/C to [facility] on Monday. MD aware of above. CSW will continue to follow and assist as needed.   Blima Rich, LCSWa [phone]

## 2015-02-16 NOTE — Anesthesia Preprocedure Evaluation (Signed)
Anesthesia Evaluation  Patient identified by MRN, date of birth, ID band Patient confused    Reviewed: Allergy & Precautions, NPO status , Patient's Chart, lab work & pertinent test results  Airway Mallampati: II  TM Distance: >3 FB Neck ROM: Full    Dental  (+) Missing   Pulmonary COPD (2lL O2 contiuously) COPD inhaler and oxygen dependent, former smoker,          Cardiovascular hypertension, Pt. on medications and Pt. on home beta blockers DVT (s/p greenfield filter)     Neuro/Psych PSYCHIATRIC DISORDERS (dementia, short-term memory loss)    GI/Hepatic GERD-  Medicated,  Endo/Other    Renal/GU      Musculoskeletal  (+) Arthritis -, Osteoarthritis,    Abdominal   Peds  Hematology   Anesthesia Other Findings   Reproductive/Obstetrics                             Anesthesia Physical Anesthesia Plan  ASA: III and emergent  Anesthesia Plan: Spinal   Post-op Pain Management:    Induction:   Airway Management Planned: Simple Face Mask  Additional Equipment:   Intra-op Plan:   Post-operative Plan:   Informed Consent: I have reviewed the patients History and Physical, chart, labs and discussed the procedure including the risks, benefits and alternatives for the proposed anesthesia with the patient or authorized representative who has indicated his/her understanding and acceptance.     Plan Discussed with:   Anesthesia Plan Comments:         Anesthesia Quick Evaluation

## 2015-02-16 NOTE — Progress Notes (Signed)
Mrs Reggy Eyertie Ke admitted from ED with R Hip fx. [facility]. Oriented to person and place, dementia. Consent obtained from daughter, [place]. Bathed and prepped for surgery, medications held d/t possible surgery today, beta blocker given. Pain managed with PRN medications and positioning. Nursing continues to monitor and assist with ADLs.

## 2015-02-16 NOTE — Progress Notes (Signed)
Per RN in progression meeting patient will have surgery today (02/16/15) and is from [facility]. Clinical Social Worker (CSW) will follow and assist with getting patient to appropriate level of care.  Jetta LoutBailey Morgan, LCSWA [phone]

## 2015-02-16 NOTE — Progress Notes (Signed)
Patient returned from surgery & VSS at this time. She is confused and is feeling suspicious of what we are doing for her. Daughter states that her mother did not have her celexa last night and this is the reason for her suspicious feelings. Called Dr. Rosita KeaMenz and he advised me to give her celexa now and to hold tonights dose. Back on schedule tomorrow. Patient is resting quietly now. IV fluids and abx infusing.  Continue to monitor patient.

## 2015-02-16 NOTE — Transfer of Care (Signed)
Immediate Anesthesia Transfer of Care Note  Patient: Holly Drake  Procedure(s) Performed: Procedure(s): INTRAMEDULLARY (IM) NAIL FEMORAL (Right)  Patient Location: PACU  Anesthesia Type:General  Level of Consciousness: sedated  Airway & Oxygen Therapy: Patient Spontanous Breathing and Patient connected to face mask oxygen  Post-op Assessment: Report given to RN and Post -op Vital signs reviewed and stable  Post vital signs: Reviewed and stable  Last Vitals:  Filed Vitals:   02/16/15 1315  BP: 96/71  Pulse: 62  Temp: 36 C  Resp:     Complications: No apparent anesthesia complications

## 2015-02-16 NOTE — Progress Notes (Signed)
Intra op c arm views not saved, xrays ordered to show initial fixation

## 2015-02-16 NOTE — Op Note (Signed)
02/15/2015 - 02/16/2015  1:26 PM  PATIENT:  Holly Drake  81 y.o. female  PRE-OPERATIVE DIAGNOSIS:  fractured right hip  Subtrochanteric, comminuted   POST-OPERATIVE DIAGNOSIS:  fractured right hip  PROCEDURE:  Procedure(s): INTRAMEDULLARY (IM) NAIL FEMORAL (Right)  SURGEON: Leitha SchullerMichael J Samira Acero, MD  ASSISTANTS: None SIA:   general  EBL:  Total I/O In: 2100 [I.V.:2100] Out: 450 [Urine:200; Blood:250]  BLOOD ADMINISTERED:none  DRAINS: none   LOCAL MEDICATIONS USED:  NONE  SPECIMEN:  No Specimen  DISPOSITION OF SPECIMEN:  N/A  COUNTS:  YES  TOURNIQUET:  * No tourniquets in log *  IMPLABiomet affixes, 1:30 degrees, 11 x 3 60 mm, ulnar 15 mm lag screw, 42 mm distal screw ICTATION: .Dragon Dictation patient was brought to the operating room and after adequate spinal anesthesia was obtained, the patient was transferred to the fracture table with the left leg in the well-leg holder right leg in the traction boot and traction applied. C-arm was brought in and adequate alignment was obtained. The hip was prepped and draped using a Barrier drape method. Proximal incision was made after appropriate patient identification and timeout procedures guide were inserted into the tip of the trochanter and proximal reaming carried out. The guidewires and inserted down the canal and reaming carried up to 13 mm. A small lateral incision was made with a clamp to aid in reduction of the comminuted anterior butterfly fragment subtrochanteric. The fixed rod was then inserted down the canal to the appropriate level the clamp removed and a guidewire inserted into the center center position of the head measures made off of this and 115 mm lag screw inserted with proximal screw tightened. The insertion handle was removed and the distal screws fit filled using a freehand technique drilling measuring and placing a 42 mm 5.0 cortical screw. Intraoperative AP and lateral imaging was obtained that showed acceptable  alignment. The wounds were thoroughly irrigated and closed with #1 Vicryls, 2-0 Monocryl and skin staples. Dressings applied patient center comes stable condition PLAN OF CARE: Continues inpatient  PATIENT DISPOSITION:  PACU - hemodynamically stable.

## 2015-02-16 NOTE — Consult Note (Signed)
patient is a 81 year old admitted with a proximal femur fracture. [facility] secondary to dementia. She is a minimal ambulator initially doing just transfers to wheelchair and to bathroom.  Past history is significant for a DVT with vena cava filter placed last year. Physical exam patient has shortening of the right lower extremity with slight external rotation she has trace dorsalis pedis and posterior tibial pulse. She is able to flex extend the toes there is significant lower extremity edema  X-rays show comminuted subtrochanteric right proximal femur fracture.  Impression complex right subtrochanteric femur fracture  Plan: ORIF with IM rod and possible cerclage wire, we'll need additional anticoagulation postoperatively, probably Coumadin for 8-12 weeks with history of prior DVT

## 2015-02-16 NOTE — Clinical Social Work Note (Signed)
Clinical Social Work Assessment  Patient Details  Name: Holly Drake MRN: [id number] Date of Birth: 09/03/1924  Date of referral:  02/16/15               Reason for consult:  Facility Placement                Permission sought to share information with:  Chartered certified accountant granted to share information::  Yes, Verbal Permission Granted  Name::      [place]::   [facility]F's  Relationship::     Contact Information:     Housing/Transportation Living arrangements for the past 2 months:  [place] of Information:  Adult Children Patient Interpreter Needed:  None Criminal Activity/Legal Involvement Pertinent to Current Situation/Hospitalization:  No - Comment as needed Significant Relationships:  Adult Children Lives with:  Facility Resident Do you feel safe going back to the place where you live?  Yes Need for family participation in patient care:  Yes (Comment)  Care giving concerns: Patient lives at [facility].    Social Worker assessment / plan: Holiday representative (CSW) met with patient and her daughter Holly Drake HPOA [phone]. Patient was asleep and daughter sitting at bedside. Patient is scheduled for surgery today. Daughter reported that patient lives at [facility]. Daughter wants patient to go to rehab after surgery. Daughter reported that patient has been to [facility] before and would like to go back for rehab.   FL2 complete and faxed out. CSW asked Kim admissions coordinator at [facility] to review referral.   Employment status:  Retired Forensic scientist:  Medicare PT Recommendations:  Not assessed at this time Information / Referral to community resources:  [place]  Patient/Family's Response to care: Patient's daughter Holly Drake is agreeable to SNF search.   Patient/Family's Understanding of and Emotional Response to Diagnosis, Current Treatment, and  Prognosis: Patient is going for surgery today. Daughter was pleasant and thanked CSW for visit and assisting with placement.   Emotional Assessment Appearance:  Appears older than stated age Attitude/Demeanor/Rapport:  Unable to Assess (Patient was asleep during assessment. ) Affect (typically observed):    Orientation:  Oriented to Self Alcohol / Substance use:  Not Applicable Psych involvement (Current and /or in the community):  No (Comment)  Discharge Needs  Concerns to be addressed:  Discharge Planning Concerns Readmission within the last 30 days:  No Current discharge risk:  Chronically ill Barriers to Discharge:  Continued Medical Work up, Other (Surgery today)   Loralyn Freshwater, LCSW 02/16/2015, 11:26 AM

## 2015-02-16 NOTE — Progress Notes (Signed)
ANTICOAGULATION CONSULT NOTE - Initial Consult  Pharmacy Consult for Coumadin Indication: VTE prophylaxis  No Known Allergies  Patient Measurements: Height: 5' (152.4 cm) Weight: 145 lb 3.2 oz (65.862 kg) IBW/kg (Calculated) : 45.5   Vital Signs: Temp: 98.4 F (36.9 C) (06/03 1451) Temp Source: Oral (06/03 1451) BP: 141/75 mmHg (06/03 1451) Pulse Rate: 70 (06/03 1451)  Labs:  Recent Labs  02/15/15 2052  02/15/15 2102 02/16/15 0455 02/16/15 1517  HGB  --   < > 12.7 10.7* 11.1*  HCT  --   --  39.2 33.1* 33.6*  PLT  --   --  214 181 186  APTT  --   --  24  --   --   LABPROT  --   --  13.3  --   --   INR  --   --  0.99  --   --   CREATININE  --   --  0.71  --  0.77  CKTOTAL 69  --   --   --   --   TROPONINI <0.03  --   --   --   --   < > = values in this interval not displayed.  Estimated Creatinine Clearance: 40.4 mL/min (by C-G formula based on Cr of 0.77).   Medical History: Past Medical History  Diagnosis Date  . COPD (chronic obstructive pulmonary disease)   . Dementia   . OA (osteoarthritis)     Medications:  Scheduled:  . amLODipine  10 mg Oral Daily  . calcium carbonate  1 tablet Oral Daily  .  ceFAZolin (ANCEF) IV  2 g Intravenous Q6H  . chlorhexidine  15 mL Mouth/Throat TID  . [START ON 02/19/2015] cholecalciferol  2,000 Units Oral Once per day on Mon Thu  . citalopram  20 mg Oral QHS  . docusate sodium  100 mg Oral BID  . [START ON 02/17/2015] enoxaparin (LOVENOX) injection  30 mg Subcutaneous Q24H  . furosemide  20 mg Oral QODAY  . losartan  100 mg Oral Daily  . metoprolol tartrate  75 mg Oral BID  . mometasone-formoterol  2 puff Inhalation BID  . pantoprazole  40 mg Oral Daily  . [START ON 02/17/2015] predniSONE  50 mg Oral Q breakfast  . sodium fluoride  1 application dental TID  . sulfaSALAzine  500 mg Oral BID WC  . warfarin  5 mg Oral q1800   Infusions:  . sodium chloride     PRN: acetaminophen **OR** acetaminophen, alum & mag  hydroxide-simeth, bisacodyl, HYDROcodone-acetaminophen, magnesium citrate, magnesium hydroxide, menthol-cetylpyridinium **OR** phenol, methocarbamol **OR** methocarbamol (ROBAXIN)  IV, metoCLOPramide **OR** metoCLOPramide (REGLAN) injection, morphine injection, morphine injection, ondansetron **OR** ondansetron (ZOFRAN) IV  Assessment: Patient is s/p ORIF hip with a h/o DVT ordered Lovenox and Coumadin for DVT prophylaxis.   Goal of Therapy:  INR 2-3   Plan:  Will begin Coumadin 5 mg daily and f/u AM INR.   Holly Drake, [name] 02/16/2015,3:45 PM
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
## 2015-02-17 LAB — BASIC METABOLIC PANEL
Anion gap: 8 (ref 5–15)
BUN: 18 mg/dL (ref 6–20)
CALCIUM: 8.1 mg/dL — AB (ref 8.9–10.3)
CO2: 30 mmol/L (ref 22–32)
Chloride: 98 mmol/L — ABNORMAL LOW (ref 101–111)
Creatinine, Ser: 0.64 mg/dL (ref 0.44–1.00)
GFR calc Af Amer: >60 mL/min (ref >60)
GFR calc non Af Amer: >60 mL/min (ref >60)
GLUCOSE: 143 mg/dL — AB (ref 65–99)
Potassium: 4.3 mmol/L (ref 3.5–5.1)
SODIUM: 136 mmol/L (ref 135–145)

## 2015-02-17 LAB — PROTIME-INR
INR: 1.2
PROTHROMBIN TIME: 15.4 s — AB (ref 11.4–15.0)

## 2015-02-17 LAB — CBC
HEMATOCRIT: 28.1 % — AB (ref 35.0–47.0)
Hemoglobin: 9 g/dL — ABNORMAL LOW (ref 12.0–16.0)
MCH: 28.9 pg (ref 26.0–34.0)
MCHC: 31.9 g/dL — ABNORMAL LOW (ref 32.0–36.0)
MCV: 90.4 fL (ref 80.0–100.0)
PLATELETS: 154 10*3/uL (ref 150–440)
RBC: 3.11 MIL/uL — ABNORMAL LOW (ref 3.80–5.20)
RDW: 13.3 % (ref 11.5–14.5)
WBC: 6.5 10*3/uL (ref 3.6–11.0)

## 2015-02-17 NOTE — Progress Notes (Signed)
Paged Dr. Amado CoeGouru re: patients b/p. 105/46.

## 2015-02-17 NOTE — Progress Notes (Signed)
Orthopaedics: POD #1 - ORIF right peritrochanteric femur fracture   AF, VSS Patient awake and alert Pain well controlled this AM.  Lungs: Occasional end expiratory wheezes Cardiac: RRR, normal S1 S2. No murmurs RLE: Hip dressing with some bloody drainage. Moderate swelling to thigh. Negative Homan. Neuro: Sensory & motor function grossly intact.  Begin PT/OT. Labs in AM.

## 2015-02-17 NOTE — Evaluation (Signed)
Occupational Therapy Evaluation Patient Details Name: Holly Drake E Rutten MRN: [id number] DOB: 07/24/1924 Today's Date: 02/17/2015    History of Present Illness Right hip intramedullary nail secondary to femur fracture   Clinical Impression   Patient is an 81 year old female admitted with ORIF secondary to femur fracture. Patient was pleasant, but oriented to person only. [facility]. Per chart review was a minimal ambulator that transfers to w/c and bathroom. Patient states she was able to walk, but was scared of falling so used a w/c. Patient also states that she had help with dressing and bathing, and even toileting so she would not fall. Patient had increase pain with right hip during all movement and needed encouragement to complete task. Patient able to perform bed mobility with Mod-Max A x2 for supine to sit. Required cuing for sequencing and hand placement. Attempted stand with hand held assist x2 and was able to take a few steps to transfer to recliner. Rolling walker was present, but not attempted secondary to increased anxiety and pain with sit to stand. Once patient was positioned in recliner, was educated on use of AE for lower body dressing. Patient not aware of use of reacher when introduced, but was able to return demonstrate use to pick up small item after therapist educated patient on use. Patient will require further education.    Follow Up Recommendations  SNF    Equipment Recommendations       Recommendations for Other Services       Precautions / Restrictions Precautions Precautions: Fall Restrictions Weight Bearing Restrictions: Yes RLE Weight Bearing: Weight bearing as tolerated      Mobility Bed Mobility Overal bed mobility: Needs Assistance;+2 for physical assistance Bed Mobility: Supine to Sit              Transfers Overall transfer level: Needs assistance Equipment used: 2 person hand held  assist Transfers: Sit to/from Stand           General transfer comment: Patient transferred bed to recliner with +2 handheld assist and verbal cues for sequencing steps.    Balance                                            ADL Overall ADL's : Needs assistance/impaired                     Lower Body Dressing: Total assistance   Toilet Transfer: Maximal assistance;+2 for physical assistance           Functional mobility during ADLs: Maximal assistance;+2 for physical assistance       Vision     Perception     Praxis      Pertinent Vitals/Pain Pain Assessment: 0-10 Pain Score: 5  Pain Location: Right hip Pain Descriptors / Indicators: Other (Comment) (Patient complained of pain in right hip during movement. Stated pain "about half way" when asked to grade on scale of 1-10) Pain Intervention(s): Monitored during session;Repositioned     Hand Dominance     Extremity/Trunk Assessment Upper Extremity Assessment Upper Extremity Assessment: Overall WFL for tasks assessed   Lower Extremity Assessment Lower Extremity Assessment: Defer to PT evaluation       Communication Communication Communication: HOH   Cognition Arousal/Alertness: Awake/alert Behavior During Therapy: WFL for tasks assessed/performed Overall Cognitive Status:  History of cognitive impairments - at baseline       Memory: Decreased short-term memory             General Comments       Exercises       Shoulder Instructions      Home Living Family/patient expects to be discharged to:: Skilled nursing facility                                        Prior Functioning/Environment Level of Independence: Needs assistance    ADL's / Homemaking Assistance Needed: Patient states had assistance with all self-care ADL tasks, including toileting, bathing, and dressing.        OT Diagnosis: Generalized weakness;Cognitive deficits;Acute pain    OT Problem List: Decreased activity tolerance;Impaired balance (sitting and/or standing);Decreased knowledge of use of DME or AE;Decreased safety awareness   OT Treatment/Interventions: Self-care/ADL training;Balance training;DME and/or AE instruction;Patient/family education    OT Goals(Current goals can be found in the care plan section) Acute Rehab OT Goals OT Goal Formulation: Patient unable to participate in goal setting  OT Frequency: Min 1X/week   Barriers to D/C:            Co-evaluation              End of Session Equipment Utilized During Treatment: Gait belt  Activity Tolerance: Patient limited by pain;Patient limited by fatigue Patient left: in bed;with call bell/phone within reach;with chair alarm set   Time: [phone] OT Time Calculation (min): 27 min Charges:  OT General Charges $OT Visit: 1 Procedure OT Evaluation $Initial OT Evaluation Tier I: 1 Procedure OT Treatments $Self Care/Home Management : 8-22 mins G-Codes:    Wrenna Saks L 02/18/2015, 9:59 AM

## 2015-02-17 NOTE — Progress Notes (Signed)
ANTICOAGULATION CONSULT NOTE - Initial Consult  Pharmacy Consult for Coumadin Indication: VTE prophylaxis  No Known Allergies  Patient Measurements: Height: 5' (152.4 cm) Weight: 141 lb 8.6 oz (64.2 kg) IBW/kg (Calculated) : 45.5   Vital Signs: Temp: 98.7 F (37.1 C) (06/04 0734) Temp Source: Oral (06/04 0734) BP: 110/50 mmHg (06/04 0734) Pulse Rate: 91 (06/04 0734)  Labs:  Recent Labs  02/15/15 2052  02/15/15 2102 02/16/15 0455 02/16/15 1517 02/16/15 1553 02/17/15 0402  HGB  --   < > 12.7 10.7* 11.1*  --  9.0*  HCT  --   < > 39.2 33.1* 33.6*  --  28.1*  PLT  --   < > 214 181 186  --  154  APTT  --   --  24  --   --   --   --   LABPROT  --   --  13.3  --   --  14.1 15.4*  INR  --   --  0.99  --   --  1.07 1.20  CREATININE  --   --  0.71  --  0.77  --  0.64  CKTOTAL 69  --   --   --   --   --   --   TROPONINI <0.03  --   --   --   --   --   --   < > = values in this interval not displayed.  Estimated Creatinine Clearance: 39.9 mL/min (by C-G formula based on Cr of 0.64).   Medical History: Past Medical History  Diagnosis Date  . COPD (chronic obstructive pulmonary disease)   . Dementia   . OA (osteoarthritis)     Medications:  Scheduled:  . amLODipine  10 mg Oral Daily  . calcium carbonate  1 tablet Oral Daily  . chlorhexidine  15 mL Mouth/Throat TID  . [START ON 02/19/2015] cholecalciferol  2,000 Units Oral Once per day on Mon Thu  . citalopram  20 mg Oral QHS  . docusate sodium  100 mg Oral BID  . enoxaparin (LOVENOX) injection  30 mg Subcutaneous Q24H  . furosemide  20 mg Oral QODAY  . losartan  100 mg Oral Daily  . metoprolol tartrate  75 mg Oral BID  . mometasone-formoterol  2 puff Inhalation BID  . pantoprazole  40 mg Oral Daily  . predniSONE  50 mg Oral Q breakfast  . sodium fluoride  1 application dental TID  . sulfaSALAzine  500 mg Oral BID WC  . warfarin  5 mg Oral q1800   Infusions:  . sodium chloride 75 mL/hr at 02/17/15 0413   PRN:  acetaminophen **OR** acetaminophen, alum & mag hydroxide-simeth, bisacodyl, HYDROcodone-acetaminophen, magnesium hydroxide, menthol-cetylpyridinium **OR** phenol, methocarbamol **OR** methocarbamol (ROBAXIN)  IV, metoCLOPramide **OR** metoCLOPramide (REGLAN) injection, morphine injection, morphine injection, ondansetron **OR** ondansetron (ZOFRAN) IV  Assessment: Patient is s/p ORIF hip with a h/o DVT ordered Lovenox and Coumadin for DVT prophylaxis.   Goal of Therapy:  INR 2-3   Plan:  6/4 INR = 1.2.  Will continue Coumadin 5 mg daily and f/u AM INR.   Littie Chiem K, RPH 02/17/2015,11:52 AM

## 2015-02-17 NOTE — Outcomes Assessment (Signed)
VSS, patient is Alert but confused. Oriented to person and situation.  Turned q2h. Pain is controlled with pain medications.  Teds/ scd's in place.  Iv fluids infusing.  Bath received this am and foley d/c'd

## 2015-02-17 NOTE — Progress Notes (Signed)
Physical Therapy Treatment Patient Details Name: Holly Drake MRN: [id number] DOB: 06/30/1924 Today's Date: 02/17/2015    History of Present Illness Holly Drake is an 81 yo Female, pleasant with dementia/parkinson's disease presents to ED with right hip pain; [facility]; X-ray shows commin7uted fracture of lesser/greater trochanter; Patient is s/p ORIF of RLE; WBAT;     PT Comments    Patient falling asleep in chair at start of PT treatment session. She was able to wake up with verbal cues; Patient hesitant to stand due to fear of pain in her right hip; She requires max A +2 for sit<>stand and stand pivot transfers due to poor weight shift and weakness. Patient still has difficulty following commands requiring tactile cues to initiate movement. She would benefit from additional skilled PT intervention to improve LE strength, balance/gait safety.   Follow Up Recommendations  SNF     Equipment Recommendations       Recommendations for Other Services       Precautions / Restrictions Precautions Precautions: Fall Restrictions Weight Bearing Restrictions: Yes RLE Weight Bearing: Weight bearing as tolerated    Mobility  Bed Mobility Overal bed mobility: Needs Assistance;+2 for physical assistance Bed Mobility: Sit to Supine     Supine to sit: Mod assist Sit to supine: Max assist;+2 for physical assistance   General bed mobility comments: Patient requires max A +2 to transition sit to supine x1 rep; and dependent for scooting up in bed;   Transfers Overall transfer level: Needs assistance Equipment used: 2 person hand held assist Transfers: Sit to/from [employer] CorporationStand;Stand Pivot Transfers Sit to Stand: Max assist;+2 physical assistance Stand pivot transfers: Max assist;+2 physical assistance       General transfer comment: Patient transferred bedside chair to bedside commode x1 rep with max A +2, and transfers sit<>Stand from commode x2 reps, maxA +2 and  transfered from bedside commode, backwards to bed, max A +2;   Ambulation/Gait             General Gait Details: pt unable to ambulate at this time;    Information systems managertairs            Wheelchair Mobility    Modified Rankin (Stroke Patients Only)       Balance Overall balance assessment: Needs assistance Sitting-balance support: Bilateral upper extremity supported Sitting balance-Leahy Scale: Poor Sitting balance - Comments: requires at least min A to maintain sitting balance with 2 HHA on bed, unsupported;  Postural control: Left lateral lean Standing balance support: Bilateral upper extremity supported Standing balance-Leahy Scale: Zero Standing balance comment: pt mod A +2 during standing with 2 HHA with poor weight shift requiring cues to increase erect posture for better balance;                     Cognition Arousal/Alertness: Awake/alert Behavior During Therapy: WFL for tasks assessed/performed Overall Cognitive Status: History of cognitive impairments - at baseline (not oriented to date/place; oriented to situation; )       Memory: Decreased short-term memory              Exercises      General Comments General comments (skin integrity, edema, etc.): some wound drainage noted from right hip (minimal);       Pertinent Vitals/Pain Pain Assessment: No/denies pain (no pain at rest; increased pain with movement  unable to verbalize) Pain Score: 5  Pain Location: right hip Pain Descriptors / Indicators: Sore (only pain  with movement; no pain at rest; ) Pain Intervention(s): Monitored during session;Repositioned    Home Living Family/patient expects to be discharged to:: Skilled nursing facility                    Prior Function Level of Independence: Needs assistance  Gait / Transfers Assistance Needed: would transfer from bed to wheelchair according to notes from SNF;  ADL's / Homemaking Assistance Needed: Patient states had assistance with all  self-care ADL tasks, including toileting, bathing, and dressing.     PT Goals (current goals can now be found in the care plan section) Acute Rehab PT Goals Patient Stated Goal: "I don't want to hurt." PT Goal Formulation: With patient Time For Goal Achievement: 03/03/15 Potential to Achieve Goals: Fair Progress towards PT goals: Progressing toward goals    Frequency  BID    PT Plan Current plan remains appropriate    Co-evaluation PT/OT/SLP Co-Evaluation/Treatment: Yes Reason for Co-Treatment: Necessary to address cognition/behavior during functional activity PT goals addressed during session: Mobility/safety with mobility;Balance;Strengthening/ROM OT goals addressed during session: Proper use of Adaptive equipment and DME     End of Session Equipment Utilized During Treatment: Gait belt Activity Tolerance: Patient limited by pain Patient left: with call bell/phone within reach;in bed;with bed alarm set     Time: 1236-1300 PT Time Calculation (min) (ACUTE ONLY): 24 min  Charges:  $Therapeutic Activity: 23-37 mins                    G Codes:      Drake,Holly, PT, DPT 02/17/2015, 1:06 PM
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
## 2015-02-17 NOTE — Progress Notes (Signed)
Patient POD 1. Slept between care today & has had no c/o pain. Patient voiding after removal of urinary cath.  Advanced diet to regular. Continue to monitor patient & hold losartan if she becomes hypotensive, per Dr. Amado CoeGouru.

## 2015-02-17 NOTE — Anesthesia Postprocedure Evaluation (Signed)
  Anesthesia Post-op Note  Patient: Holly Drake  Procedure(s) Performed: Procedure(s): INTRAMEDULLARY (IM) NAIL FEMORAL (Right)  Anesthesia type:Spinal  Patient location: Floor room 144  Post pain: Pain level controlled  Post assessment: Post-op Vital signs reviewed, Patient's Cardiovascular Status Stable, Respiratory Function Stable, Patent Airway and No signs of Nausea or vomiting  Post vital signs: Reviewed and stable  Last Vitals:  Filed Vitals:   02/17/15 0734  BP: 110/50  Pulse: 91  Temp: 37.1 C  Resp: 18    Level of consciousness: awake, alert  and patient cooperative, baseline dementia  Complications: No apparent anesthesia complications

## 2015-02-17 NOTE — Evaluation (Signed)
Physical Therapy Evaluation Holly Drake Details Name: Holly Drake MRN: [id number] DOB: August 05, 1924 Today's Date: 02/17/2015   History of Present Illness  Holly Drake is an 81 yo Female, pleasant with dementia/parkinson's disease presents to ED with right hip pain; Holly Drake fell at [facility]; X-ray shows commin7uted fracture of lesser/greater trochanter; Holly Drake is s/p ORIF of RLE; WBAT;   Clinical Impression  81 yo Female fell at [facility] facility with increased RLE hip pain; she is POD1 s/p RLE hip ORIF with intramedullary nail in right femur; Holly Drake did require assistance with most ADLs prior to admittance as she demonstrates decreased cognition due to dementia and decreased mobility due to parkinson's disease; According to daughter, Holly Drake was just transferring from bed to wheelchair; Holly Drake is Mod A+2 for bed mobility, mod A +2 for sit<>Stand transfers and mod-max A +2 for stand pivot transfers; She demonstrates increased LLE lean with decreased RLE weight bearing requiring increased cues for better weight shift. She would benefit from additional skilled PT intervention to improve strength/ROM, functional mobility. PT recommends SNF/STR upon discharge to continue to address mobility needs.    Follow Up Recommendations SNF    Equipment Recommendations       Recommendations for Other Services       Precautions / Restrictions Precautions Precautions: Fall Restrictions Weight Bearing Restrictions: Yes RLE Weight Bearing: Weight bearing as tolerated      Mobility  Bed Mobility Overal bed mobility: Needs Assistance;+2 for physical assistance Bed Mobility: Supine to Sit     Supine to sit: Mod assist     General bed mobility comments: Holly Drake requires cues to initiate scooting on bed with LLE; she is mod A +2 for supine to sit with cues for hand placement and to pull on bedrail for better mobility;   Transfers Overall transfer level: Needs assistance Equipment used:  2 person hand held assist Transfers: Sit to/from [employer] Sit to Stand: Mod assist;+2 physical assistance Stand pivot transfers: Max assist;+2 physical assistance       General transfer comment: Holly Drake transferred sit<>Stand with +2 HHA, mod A; requiring max Vcs to increase push through BLE for terminal knee extension x2 reps; Holly Drake able to take a few steps to bedside chair for stand pivot transfer, max A +2 due to pain and decreased weight shift through RLE;   Ambulation/Gait             General Gait Details: pt unable to ambulate at this time;   Stairs            Wheelchair Mobility    Modified Rankin (Stroke Patients Only)       Balance Overall balance assessment: Needs assistance Sitting-balance support: Bilateral upper extremity supported Sitting balance-Leahy Scale: Poor Sitting balance - Comments: requires at least min A to maintain sitting balance with 2 HHA on bed, unsupported;  Postural control: Left lateral lean Standing balance support: Bilateral upper extremity supported Standing balance-Leahy Scale: Zero Standing balance comment: pt mod A +2 during standing with 2 HHA with poor weight shift requiring cues to increase erect posture for better balance;                              Pertinent Vitals/Pain Pain Assessment: 0-10 Pain Score: 5  Pain Location: right hip  Pain Descriptors / Indicators: Sore (only pain with movement; no pain at rest; ) Pain Intervention(s): Monitored during session;Repositioned    Home Living  Family/Holly Drake expects to be discharged to:: Skilled nursing facility                      Prior Function Level of Independence: Needs assistance   Gait / Transfers Assistance Needed: would transfer from bed to wheelchair according to notes from SNF;   ADL's / Homemaking Assistance Needed: Holly Drake states had assistance with all self-care ADL tasks, including toileting, bathing, and  dressing.        Hand Dominance        Extremity/Trunk Assessment   Upper Extremity Assessment: Overall WFL for tasks assessed           Lower Extremity Assessment: RLE deficits/detail;LLE deficits/detail RLE Deficits / Details: difficult to MMT due to cognitive deficits; requires assistance to lift RLE; hip grossly 3-/5, knee 4/5 as Holly Drake able to stand with terminal knee extension with assistance;  LLE Deficits / Details: LLE is WFL as Holly Drake is able to initiate movement during bed mobility and push through LLE during standing;      Communication   Communication: HOH  Cognition Arousal/Alertness: Awake/alert Behavior During Therapy: WFL for tasks assessed/performed Overall Cognitive Status: History of cognitive impairments - at baseline (not oriented to date/place; oriented to situation; )       Memory: Decreased short-term memory              General Comments General comments (skin integrity, edema, etc.): some wound drainage noted from right hip (minimal);     Exercises        Assessment/Plan    PT Assessment Holly Drake needs continued PT services  PT Diagnosis Difficulty walking;Generalized weakness;Acute pain   PT Problem List Decreased strength;Decreased cognition;Decreased range of motion;Decreased activity tolerance;Decreased balance;Pain;Decreased mobility  PT Treatment Interventions Balance training;Gait training;Neuromuscular re-education;Functional mobility training;Holly Drake/family education;Therapeutic activities;Wheelchair mobility training;Therapeutic exercise   PT Goals (Current goals can be found in the Care Plan section) Acute Rehab PT Goals Holly Drake Stated Goal: "I don't want to hurt." PT Goal Formulation: With Holly Drake Time For Goal Achievement: 03/03/15 Potential to Achieve Goals: Fair    Frequency BID   Barriers to discharge Other (comment) poor cognition at baseline; Holly Drake [facility]; will need STR  at SNF prior to returning due to low physical functioning;     Co-evaluation PT/OT/SLP Co-Evaluation/Treatment: Yes Reason for Co-Treatment: Necessary to address cognition/behavior during functional activity PT goals addressed during session: Mobility/safety with mobility;Balance;Strengthening/ROM OT goals addressed during session: Proper use of Adaptive equipment and DME       End of Session Equipment Utilized During Treatment: Gait belt Activity Tolerance: Holly Drake limited by pain Holly Drake left: with chair alarm set;in chair;with call bell/phone within reach Nurse Communication: Mobility status         Time: [id number] PT Time Calculation (min) (ACUTE ONLY): 25 min   Charges:   PT Evaluation $Initial PT Evaluation Tier I: 1 Procedure     PT G Codes:        Hopkins,Shadae Reino 02/17/2015, 10:35 AM

## 2015-02-18 ENCOUNTER — Inpatient Hospital Stay: Payer: Medicare Other

## 2015-02-18 LAB — CBC WITH DIFFERENTIAL/PLATELET
BASOS PCT: 0 %
Basophils Absolute: 0 10*3/uL (ref 0–0.1)
EOS ABS: 0 10*3/uL (ref 0–0.7)
EOS PCT: 0 %
HCT: 24.9 % — ABNORMAL LOW (ref 35.0–47.0)
Hemoglobin: 8.2 g/dL — ABNORMAL LOW (ref 12.0–16.0)
LYMPHS ABS: 0.6 10*3/uL — AB (ref 1.0–3.6)
LYMPHS PCT: 7 %
MCH: 29.6 pg (ref 26.0–34.0)
MCHC: 33 g/dL (ref 32.0–36.0)
MCV: 89.8 fL (ref 80.0–100.0)
Monocytes Absolute: 0.9 10*3/uL (ref 0.2–0.9)
Monocytes Relative: 11 %
NEUTROS ABS: 6.9 10*3/uL — AB (ref 1.4–6.5)
Neutrophils Relative %: 82 %
Platelets: 158 10*3/uL (ref 150–440)
RBC: 2.77 MIL/uL — AB (ref 3.80–5.20)
RDW: 13.4 % (ref 11.5–14.5)
WBC: 8.4 10*3/uL (ref 3.6–11.0)

## 2015-02-18 LAB — PROTIME-INR
INR: 2.68
PROTHROMBIN TIME: 28.6 s — AB (ref 11.4–15.0)

## 2015-02-18 MED ORDER — METHYLPREDNISOLONE SODIUM SUCC 125 MG IJ SOLR
60.0000 mg | Freq: Three times a day (TID) | INTRAMUSCULAR | Status: DC
  Administered 2015-02-18 – 2015-02-19 (×3): 60 mg via INTRAVENOUS
  Filled 2015-02-18 (×3): qty 2

## 2015-02-18 MED ORDER — METHYLPREDNISOLONE SODIUM SUCC 125 MG IJ SOLR
125.0000 mg | Freq: Once | INTRAMUSCULAR | Status: AC
  Administered 2015-02-18: 125 mg via INTRAVENOUS
  Filled 2015-02-18: qty 2

## 2015-02-18 MED ORDER — ALBUTEROL SULFATE (2.5 MG/3ML) 0.083% IN NEBU
2.5000 mg | INHALATION_SOLUTION | RESPIRATORY_TRACT | Status: DC | PRN
  Administered 2015-02-18 – 2015-02-20 (×6): 2.5 mg via RESPIRATORY_TRACT
  Filled 2015-02-18 (×6): qty 3

## 2015-02-18 MED ORDER — WARFARIN - PHARMACIST DOSING INPATIENT
Freq: Every day | Status: DC
  Filled 2015-02-18: qty 1

## 2015-02-18 MED ORDER — HALOPERIDOL LACTATE 5 MG/ML IJ SOLN
1.0000 mg | Freq: Four times a day (QID) | INTRAMUSCULAR | Status: DC | PRN
  Administered 2015-02-18 – 2015-02-19 (×2): 1 mg via INTRAVENOUS
  Filled 2015-02-18 (×2): qty 1

## 2015-02-18 MED ORDER — LEVOFLOXACIN IN D5W 500 MG/100ML IV SOLN
500.0000 mg | INTRAVENOUS | Status: DC
  Administered 2015-02-18 – 2015-02-19 (×2): 500 mg via INTRAVENOUS
  Filled 2015-02-18 (×3): qty 100

## 2015-02-18 MED ORDER — WARFARIN SODIUM 2 MG PO TABS: 3.0000 mg | ORAL_TABLET | Freq: Every day | ORAL | Status: DC

## 2015-02-18 MED ORDER — FUROSEMIDE 20 MG PO TABS
20.0000 mg | ORAL_TABLET | Freq: Every day | ORAL | Status: DC
  Administered 2015-02-18 – 2015-02-19 (×2): 20 mg via ORAL
  Filled 2015-02-18 (×2): qty 1

## 2015-02-18 MED ORDER — FUROSEMIDE 10 MG/ML IJ SOLN
20.0000 mg | Freq: Once | INTRAMUSCULAR | Status: AC
  Administered 2015-02-18: 20 mg via INTRAVENOUS
  Filled 2015-02-18: qty 2

## 2015-02-18 NOTE — Progress Notes (Signed)
[facility] - [place] at [facility]   PATIENT NAME: Holly Drake    MR#:  [id number]  DATE OF BIRTH:  09/02/1924  SUBJECTIVE:  CHIEF COMPLAINT:   Chief Complaint  Patient presents with  . Fall   - Patient with dementia and Parkinson's disease, admitted after a fall and noted to have a right proximal femoral fracture. -Today patient is very confused and diffusely wheezing. No family members at bedside Stat chest x-ray and ABGs  Ordered.  REVIEW OF SYSTEMS:  ROS  Unable to perform review of system in view of dementia DRUG ALLERGIES:  No Known Allergies  VITALS:  Blood pressure 118/60, pulse 94, temperature 97.9 F (36.6 C), temperature source Oral, resp. rate 24, height 5' (1.524 m), weight 72.031 kg (158 lb 12.8 oz), SpO2 92 %.  PHYSICAL EXAMINATION:  Physical Exam  GENERAL:  81 y.o.-year-old patient lying in the bed with no acute distress. bair Journalist, newspaperhugger in place. EYES: Pupils equal, round, reactive to light and accommodation. No scleral icterus. Extraocular muscles intact.  HEENT: Head atraumatic, normocephalic. Oropharynx and nasopharynx clear.  NECK:  Supple, no jugular venous distention. No thyroid enlargement, no tenderness.  LUNGS:diffuse wheezing present with respiratory distress, some rhonchi or present .  using accessory muscles  CARDIOVASCULAR: S1, S2 normal. 3/6 systolic murmur present, no rubs, or gallops.  ABDOMEN: Soft, nontender, nondistended. Bowel sounds present. No organomegaly or mass.  EXTREMITIES: 1+ pedal edema present, no cyanosis, or clubbing. Ice pack in place at right hip from recent surgery, dressing with some soaked blood NEUROLOGIC: Sedated slightly. Moving all extremities in bed. Following some simple commands.  PSYCHIATRIC: The patient is alert and not oriented.  SKIN: No obvious rash, lesion, or ulcer.    LABORATORY PANEL:   CBC  Recent Labs Lab 02/18/15 0330  WBC 8.4  HGB 8.2*  HCT 24.9*  PLT 158    ------------------------------------------------------------------------------------------------------------------  Chemistries   Recent Labs Lab 02/15/15 2102  02/17/15 0402  NA 134*  --  136  K 3.9  --  4.3  CL 90*  --  98*  CO2 33*  --  30  GLUCOSE 147*  --  143*  BUN 15  --  18  CREATININE 0.71  < > 0.64  CALCIUM 9.0  --  8.1*  AST 23  --   --   ALT 14  --   --   ALKPHOS 87  --   --   BILITOT 0.4  --   --   < > = values in this interval not displayed. ------------------------------------------------------------------------------------------------------------------  Cardiac Enzymes  Recent Labs Lab 02/15/15 2052  TROPONINI <0.03   ------------------------------------------------------------------------------------------------------------------  RADIOLOGY:  Dg Chest 1 View  02/18/2015   CLINICAL DATA:  Increase short of breath. COPD. Recent femur fixation  EXAM: CHEST  1 VIEW  COMPARISON:  02/15/2015  FINDINGS: The cardiac silhouette is normal volume. There is a smooth round contour to the right hilum which is more prominent than radiograph several days prior and is felt represent benign vascular contour.  There is a new left pleural effusion. No overt pulmonary edema. No pneumothorax. Chronic rib fractures noted along the right lateral chest wall  IMPRESSION: 1. New left effusion. 2. Prominent right hilum is felt to represent a benign vascular shadow.   Electronically Signed   By: Genevive BiStewart  Edmunds M.D.   On: 02/18/2015 12:20   Dg C-arm 61-120 Min  02/16/2015   CLINICAL DATA:  Right femur ORIF.  EXAM: DG  C-ARM 61-120 MIN; LEFT FEMUR 2 VIEWS  FLUOROSCOPY TIME:  Not provided  COMPARISON:  Right hip radiographs- 02/15/2015  FINDINGS: A single spot coned intraoperative image of the distal aspect of the right femur is provided for review.  The provided image demonstrates the distal end of intra medullary rod which is transfixed by a single cancellous screw.  IMPRESSION: ORIF of  the right femur as above. Further evaluation with complete radiographic series of the right hip and femur could be performed as clinically indicated.   Electronically Signed   By: Simonne Come M.D.   On: 02/16/2015 13:18   Dg Femur Min 2 Views Left  02/16/2015   CLINICAL DATA:  Right femur ORIF.  EXAM: DG C-ARM 61-120 MIN; LEFT FEMUR 2 VIEWS  FLUOROSCOPY TIME:  Not provided  COMPARISON:  Right hip radiographs- 02/15/2015  FINDINGS: A single spot coned intraoperative image of the distal aspect of the right femur is provided for review.  The provided image demonstrates the distal end of intra medullary rod which is transfixed by a single cancellous screw.  IMPRESSION: ORIF of the right femur as above. Further evaluation with complete radiographic series of the right hip and femur could be performed as clinically indicated.   Electronically Signed   By: Simonne Come M.D.   On: 02/16/2015 13:18   Dg Femur Port, Min 2 Views Right  02/16/2015   CLINICAL DATA:  Status post internal fixation of right femoral intertrochanteric fracture. Initial encounter.  EXAM: RIGHT FEMUR PORTABLE 1 VIEW  COMPARISON:  Right hip radiographs performed 02/15/2015  FINDINGS: There has been successful internal fixation of the patient's comminuted right femoral intertrochanteric fracture. Alignment is improved, though there is some degree of residual angulation of the greater trochanteric fragment. The distal aspect of the intramedullary rod is unremarkable in appearance. The right femoral head remains seated at the acetabulum. No new fractures are seen.  The knee joint is grossly unremarkable in appearance. Scattered postoperative soft tissue air is noted.  IMPRESSION: Successful internal fixation of comminuted right femoral intertrochanteric fracture. Alignment is improved, though there is some degree of residual angulation of the greater trochanteric fragment. No new fracture seen.   Electronically Signed   By: Roanna Raider M.D.   On:  02/16/2015 21:12    EKG:   Orders placed or performed during the hospital encounter of 02/15/15  . EKG 12-Lead  . EKG 12-Lead    ASSESSMENT AND PLAN:   89y/oF with PMH of Dementia, COPD, depression admitted after a fall adn right proximal femoral fracture * Acute respiratory distress probably secondary to acute exacerbation of COPD -Chest x-ray, ABGs are ordered Patient is started on IV Solu-Medrol and antibiotics. Provide nebulizer treatments -IV fluids are discontinued. One dose of Lasix was given -We'll continue close monitoring  * Hip Fracture- s/p ORIF, mgmt per ortho - physical therapy, pain control - POD #2 today, monitor closely - PT recommends skilled nursing facility   * Altered mental status -has an underlying Dementia with depression-, probably worse from sundowning,  monitor, cont home meds- on celexa  * HTN- metoprolol, norvasc and lasix changed to daily basis from every other day, losartan- monitor and hold if needed if she is hypotensive  * DVT prophylaxis- started Coumadin per pharmacy management. On Lovenox subcutaneous for bridging Child psychotherapist consult, follow up with care management regarding placement  All the records are reviewed and case discussed with Care Management/Social Workerr. Management plans discussed with the patient, family and they  are in agreement.  CODE STATUS: Full Code  TOTAL CRITICAL CARE TIME TAKING CARE OF THIS PATIENT: 40 minutes.   POSSIBLE D/C IN 3 DAYS, DEPENDING ON CLINICAL CONDITION.   Ramonita Lab M.D on 02/18/2015 at 12:24 PM  Between 7am to 6pm - Pager - [phone]  After 6pm go to [email or web address] - password [email or web address] [facility]  [facility]  Office  [phone]  CC: Primary care physician; No PCP Per Patient

## 2015-02-18 NOTE — Progress Notes (Signed)
  Subjective: 2 Days Post-Op Procedure(s) (LRB): INTRAMEDULLARY (IM) NAIL FEMORAL (Right) Patient reports pain as mild.   Patient seen in rounds with Dr. Ernest PineHooten. Patient is well, and has had no acute complaints or problems Plan is to go Skilled nursing facility after hospital stay. Plan for [facility] Monday if medically stable. Negative for chest pain and shortness of breath Fever: no Gastrointestinal: Negative for nausea and vomiting  Objective: Vital signs in last 24 hours: Temp:  [97.6 F (36.4 C)-98.7 F (37.1 C)] 98 F (36.7 C) (06/05 0343) Pulse Rate:  [91-106] 96 (06/05 0343) Resp:  [18] 18 (06/05 0343) BP: (105-123)/(46-50) 123/49 mmHg (06/05 0343) SpO2:  [90 %-93 %] 91 % (06/05 0343) Weight:  [72.031 kg (158 lb 12.8 oz)] 72.031 kg (158 lb 12.8 oz) (06/05 0145)  Intake/Output from previous day:  Intake/Output Summary (Last 24 hours) at 02/18/15 0623 Last data filed at 02/18/15 0343  Gross per 24 hour  Intake      0 ml  Output      0 ml  Net      0 ml    Intake/Output this shift:    Labs:  Recent Labs  02/15/15 2102 02/16/15 0455 02/16/15 1517 02/17/15 0402 02/18/15 0330  HGB 12.7 10.7* 11.1* 9.0* 8.2*    Recent Labs  02/17/15 0402 02/18/15 0330  WBC 6.5 8.4  RBC 3.11* 2.77*  HCT 28.1* 24.9*  PLT 154 158    Recent Labs  02/15/15 2102 02/16/15 1517 02/17/15 0402  NA 134*  --  136  K 3.9  --  4.3  CL 90*  --  98*  CO2 33*  --  30  BUN 15  --  18  CREATININE 0.71 0.77 0.64  GLUCOSE 147*  --  143*  CALCIUM 9.0  --  8.1*    Recent Labs  02/17/15 0402 02/18/15 0330  INR 1.20 2.68     EXAM General - Patient is Confused Extremity - Sensation intact distally Intact pulses distally Dressing/Incision - blood tinged drainage on honeycomb dressing. Appears to be drying. Motor Function - intact, moving foot and toes well on exam. The patient is able to dorsiflex and plantarflex comfortably.  Past Medical History  Diagnosis Date  .  COPD (chronic obstructive pulmonary disease)   . Dementia   . OA (osteoarthritis)     Assessment/Plan: 2 Days Post-Op Procedure(s) (LRB): INTRAMEDULLARY (IM) NAIL FEMORAL (Right) Principal Problem:   Hip fracture requiring operative repair Active Problems:   Dementia  Estimated body mass index is 31.01 kg/(m^2) as calculated from the following:   Height as of this encounter: 5' (1.524 m).   Weight as of this encounter: 72.031 kg (158 lb 12.8 oz). Up with therapy Discharge to SN[facility] Monday [facility].  DVT Prophylaxis - Lovenox, Foot Pumps and TED hose Weight-Bearing as tolerated to right leg  Dedra Skeensodd Mandi Mattioli, PA-C Orthopaedic Surgery 02/18/2015, 6:23 AM

## 2015-02-18 NOTE — Progress Notes (Signed)
Pt had episode this AM of yelling out, unable to console. Pt wanted to be taken home, did not know where she was and could not understand why she was in the hospital or the surgical intervention that was done to her right hip/leg. Turned and repositioned for comfort. Denied pain this shift.

## 2015-02-18 NOTE — Progress Notes (Signed)
Physical Therapy Treatment Patient Details Name: Holly Drake MRN: [id number] DOB: 02/25/1924 Today's Date: 02/18/2015    History of Present Illness Holly Drake is an 81 yo Female, pleasant with dementia/parkinson's disease presents to ED with right hip pain; [facility] fell [facility]; X-ray shows commin7uted fracture of lesser/greater trochanter; Patient is s/p ORIF of RLE; WBAT;     PT Comments    Patient transferred from chair to bed with moderate assistance of one person. Patient is fearful but participated in therapy. Patient demonstrated bed mobility with min/moderate assistance of 2. Patient cued frequently to breathe through her nose to improve oxygenation. (80% O2 sats with activity). Patient will benefit from continued skilled PT services to increase her functional independence. She seems to be more alert and better able to participate in the afternoon than in the morning.  Follow Up Recommendations  SNF     Equipment Recommendations  Rolling walker with 5" wheels    Recommendations for Other Services       Precautions / Restrictions Precautions Precautions: Fall Restrictions Weight Bearing Restrictions: Yes RLE Weight Bearing: Weight bearing as tolerated Other Position/Activity Restrictions: heels suspended in supine    Mobility  Bed Mobility Overal bed mobility: Needs Assistance Bed Mobility: Rolling;Sit to Supine Rolling: Mod assist;+2 for physical assistance (cueing to use bed rails) (Mod assistance of one person, min assistance of 2nd person)     Sit to supine: Mod assist;+2 for physical assistance      Transfers Overall transfer level: Needs assistance   Transfers: Stand Pivot Transfers Sit to Stand: Mod assist Stand pivot transfers: Mod assist       General transfer comment: Patent fearful. Held to therapist's arms for standing, turning, and sitting. Took small steps between chair and bed. Moves slowly but follows  cues.  Ambulation/Gait Ambulation/Gait assistance: Mod assist Ambulation Distance (Feet): 3 Feet (chair to bed)             Stairs            Wheelchair Mobility    Modified Rankin (Stroke Patients Only)       Balance   Sitting-balance support: Feet supported   Sitting balance - Comments: Patient required CGA/min A to maintain sitting balance on edge of bed prior to ltying down. Fearfu: "Don't let go of me?!                            Cognition Arousal/Alertness: Awake/alert Behavior During Therapy: WFL for tasks assessed/performed Overall Cognitive Status: History of cognitive impairments - at baseline                      Exercises Total Joint Exercises Ankle Circles/Pumps: AAROM (X 5 bilaterally)    General Comments        Pertinent Vitals/Pain Pain Assessment:  (Unable to rate) Pain Location: R hip Pain Intervention(s): Monitored during session    Home Living                      Prior Function            PT Goals (current goals can now be found in the care plan section) Acute Rehab PT Goals Time For Goal Achievement: 03/03/15 Potential to Achieve Goals: Fair Progress towards PT goals: Progressing toward goals    Frequency  BID    PT Plan Current plan remains appropriate    Co-evaluation  PT goals addressed during session: Mobility/safety with mobility       End of Session   Activity Tolerance: Patient limited by pain (Fearfulness limiting her activity tolerance) Patient left: in bed;with call bell/phone within reach;with bed alarm set;with SCD's reapplied (heels suspended)     Time: 1191-4782 PT Time Calculation (min) (ACUTE ONLY): 19 min  Charges:  $Therapeutic Activity: 8-22 mins                    G Codes:     Buzzy Han, PT, GCS  Sandra Tellefsen A Germain Osgood 02/18/2015, 1:39 PM

## 2015-02-18 NOTE — Progress Notes (Addendum)
ANTICOAGULATION CONSULT NOTE - Follow up Consult  Pharmacy Consult for Coumadin Indication: VTE prophylaxis  No Known Allergies  Patient Measurements: Height: 5' (152.4 cm) Weight: 158 lb 12.8 oz (72.031 kg) IBW/kg (Calculated) : 45.5   Vital Signs: Temp: 97.9 F (36.6 C) (06/05 0725) Temp Source: Oral (06/05 0725) BP: 118/60 mmHg (06/05 1115) Pulse Rate: 94 (06/05 0725)  Labs:  Recent Labs  02/15/15 2052  02/15/15 2102  02/16/15 1517 02/16/15 1553 02/17/15 0402 02/18/15 0330  HGB  --   --  12.7  < > 11.1*  --  9.0* 8.2*  HCT  --   --  39.2  < > 33.6*  --  28.1* 24.9*  PLT  --   --  214  < > 186  --  154 158  APTT  --   --  24  --   --   --   --   --   LABPROT  --   < > 13.3  --   --  14.1 15.4* 28.6*  INR  --   < > 0.99  --   --  1.07 1.20 2.68  CREATININE  --   --  0.71  --  0.77  --  0.64  --   CKTOTAL 69  --   --   --   --   --   --   --   TROPONINI <0.03  --   --   --   --   --   --   --   < > = values in this interval not displayed.  Estimated Creatinine Clearance: 42.2 mL/min (by C-G formula based on Cr of 0.64).   Medical History: Past Medical History  Diagnosis Date  . COPD (chronic obstructive pulmonary disease)   . Dementia   . OA (osteoarthritis)     Medications:  Scheduled:  . amLODipine  10 mg Oral Daily  . calcium carbonate  1 tablet Oral Daily  . chlorhexidine  15 mL Mouth/Throat TID  . [START ON 02/19/2015] cholecalciferol  2,000 Units Oral Once per day on Mon Thu  . citalopram  20 mg Oral QHS  . docusate sodium  100 mg Oral BID  . enoxaparin (LOVENOX) injection  30 mg Subcutaneous Q24H  . furosemide  20 mg Intravenous Once  . furosemide  20 mg Oral Daily  . levofloxacin (LEVAQUIN) IV  500 mg Intravenous Q24H  . losartan  100 mg Oral Daily  . methylPREDNISolone (SOLU-MEDROL) injection  125 mg Intravenous Once  . methylPREDNISolone (SOLU-MEDROL) injection  60 mg Intravenous TID  . metoprolol tartrate  75 mg Oral BID  .  mometasone-formoterol  2 puff Inhalation BID  . pantoprazole  40 mg Oral Daily  . sodium fluoride  1 application dental TID  . sulfaSALAzine  500 mg Oral BID WC  . Warfarin - Pharmacist Dosing Inpatient   Does not apply q1800   Infusions:    PRN: acetaminophen **OR** acetaminophen, albuterol, alum & mag hydroxide-simeth, bisacodyl, haloperidol lactate, HYDROcodone-acetaminophen, magnesium hydroxide, menthol-cetylpyridinium **OR** phenol, methocarbamol **OR** methocarbamol (ROBAXIN)  IV, metoCLOPramide **OR** metoCLOPramide (REGLAN) injection, morphine injection, morphine injection, ondansetron **OR** ondansetron (ZOFRAN) IV  Assessment: 81 yo female s/p ORIF hip with a h/o DVT ordered Lovenox and Coumadin for DVT prophylaxis.  Current orders for warfarin 3 mg for tonight. Large increase in INR overnight.   Started on levofloxacin 6/5  Goal of Therapy:  INR 2-3   Plan:  6/4 INR = 1.2.  Will continue Coumadin 5 mg daily and f/u AM INR.  6/5 INR = 2.68 No Coumadin tonight; f/u INR in AM  Crist Fat L, RPH 02/18/2015,1:12 PM  6/6 INR 3.86. Coumadin on hold. INR in AM.

## 2015-02-18 NOTE — Progress Notes (Signed)
Patient had increased work of breathing with inspiratory & expiratory wheezes. Giving methylprednisone & iv lasix. Continue to monitor.   Patient has had episodes of confusion, including yelling out. Put pt in recliner and brought out of room to hall. Seemed to soothe her for a time, but she became very loud and vocal, disturbing other patients. Put her back to bed and she relaxed a little.  Continue to monitor.

## 2015-02-18 NOTE — Progress Notes (Signed)
Physical Therapy Treatment Patient Details Name: Holly Drake MRN: [id number] DOB: [birth year] Today's Date: 02/18/2015    History of Present Illness Holly Drake is an 81 yo Female, pleasant with dementia/parkinson's disease presents to ED with right hip pain; Patient fell at [facility]; X-ray shows commin7uted fracture of lesser/greater trochanter; Patient is s/p ORIF of RLE; WBAT;     PT Comments    Patient sleepy and was easily agitated this morning and tolerated minimal R LE exercises because of hip pain. Patient received pain medication prior to treatment and was encouraged repeatedly to allow ROM and participate in mobility, but she was confused and demonstrated increasing agitation. O2 sats 79% at rest with 2 L O2 but mouth breathing. Nurse indicated that patient was better able to participate in the afternoon yesterday. Will attempt exercises and bed mobility this afternoon, coordinating pain medication with nursing.  Follow Up Recommendations  SNF     Equipment Recommendations  Rolling walker with 5" wheels    Recommendations for Other Services       Precautions / Restrictions Precautions Precautions: Fall Restrictions Weight Bearing Restrictions: Yes RLE Weight Bearing: Weight bearing as tolerated Other Position/Activity Restrictions: heels suspended in supine    Mobility  Bed Mobility Overal bed mobility:  (attempted but unable)                Transfers                    Ambulation/Gait Ambulation/Gait assistance:  (unable to attempt)               Stairs            Wheelchair Mobility    Modified Rankin (Stroke Patients Only)       Balance                                    Cognition Arousal/Alertness:  (Sleepy) Behavior During Therapy:  (easily agitated) Overall Cognitive Status: History of cognitive impairments - at baseline (disoriented to time and place)                       Exercises Total Joint Exercises Ankle Circles/Pumps: AROM ( x 10 RLE) Hip ABduction/ADduction: PROM;Right (x 1; pt refused further ROM)    General Comments        Pertinent Vitals/Pain Pain Assessment:  (denies pain at rest; pain w/ RLE movement but unable to rate) Pain Location: R hip Pain Intervention(s): Limited activity within patient's tolerance;Premedicated before session    Home Living                      Prior Function            PT Goals (current goals can now be found in the care plan section) Acute Rehab PT Goals Patient Stated Goal: Patient unable to state goals at this time. Time For Goal Achievement: 03/03/15 Potential to Achieve Goals: Fair Progress towards PT goals: PT to reassess next treatment    Frequency  BID    PT Plan Current plan remains appropriate    Co-evaluation     PT goals addressed during session: Mobility/safety with mobility;Strengthening/ROM       End of Session   Activity Tolerance: Patient limited by pain;Treatment limited secondary to agitation Patient left: in bed;with call bell/phone within reach;with bed alarm  set;with SCD's reapplied     Time: 0830-0900 PT Time Calculation (min) (ACUTE ONLY): 30 min  Charges:  $Therapeutic Exercise: 23-37 mins                    G Codes:     Holly Drake, PT, GCS  Holly Drake 02/18/2015, 9:18 AM

## 2015-02-19 ENCOUNTER — Inpatient Hospital Stay: Payer: Medicare Other

## 2015-02-19 ENCOUNTER — Inpatient Hospital Stay: Admit: 2015-02-19 | Payer: Medicare Other

## 2015-02-19 LAB — BLOOD GAS, ARTERIAL
ACID-BASE EXCESS: 4 mmol/L — AB (ref 0.0–3.0)
ACID-BASE EXCESS: 15.4 mmol/L — AB (ref 0.0–3.0)
Allens test (pass/fail): POSITIVE — AB
BICARBONATE: 41.7 meq/L — AB (ref 21.0–28.0)
Bicarbonate: 32.5 mEq/L — ABNORMAL HIGH (ref 21.0–28.0)
FIO2: 0.32 %
FIO2: 0.28 %
O2 SAT: 92.7 %
O2 Saturation: 89.6 %
PATIENT TEMPERATURE: 37
PH ART: 7.3 — AB (ref 7.350–7.450)
Patient temperature: 37
pCO2 arterial: 66 mmHg — ABNORMAL HIGH (ref 32.0–48.0)
pCO2 arterial: 56 mmHg — ABNORMAL HIGH (ref 32.0–48.0)
pH, Arterial: 7.48 — ABNORMAL HIGH (ref 7.350–7.450)
pO2, Arterial: 64 mmHg — ABNORMAL LOW (ref 83.0–108.0)
pO2, Arterial: 61 mmHg — ABNORMAL LOW (ref 83.0–108.0)

## 2015-02-19 LAB — CBC
HCT: 25.4 % — ABNORMAL LOW (ref 35.0–47.0)
Hemoglobin: 8.4 g/dL — ABNORMAL LOW (ref 12.0–16.0)
MCH: 29.4 pg (ref 26.0–34.0)
MCHC: 33 g/dL (ref 32.0–36.0)
MCV: 89.2 fL (ref 80.0–100.0)
Platelets: 195 10*3/uL (ref 150–440)
RBC: 2.84 MIL/uL — ABNORMAL LOW (ref 3.80–5.20)
RDW: 13.3 % (ref 11.5–14.5)
WBC: 6.8 10*3/uL (ref 3.6–11.0)

## 2015-02-19 LAB — BASIC METABOLIC PANEL
Anion gap: 10 (ref 5–15)
BUN: 23 mg/dL — ABNORMAL HIGH (ref 6–20)
CHLORIDE: 93 mmol/L — AB (ref 101–111)
CO2: 33 mmol/L — ABNORMAL HIGH (ref 22–32)
Calcium: 8.7 mg/dL — ABNORMAL LOW (ref 8.9–10.3)
Creatinine, Ser: 0.61 mg/dL (ref 0.44–1.00)
GFR calc Af Amer: >60 mL/min (ref >60)
GFR calc non Af Amer: >60 mL/min (ref >60)
GLUCOSE: 206 mg/dL — AB (ref 65–99)
POTASSIUM: 4.5 mmol/L (ref 3.5–5.1)
SODIUM: 136 mmol/L (ref 135–145)

## 2015-02-19 LAB — PROTIME-INR
INR: 3.86
PROTHROMBIN TIME: 37.9 s — AB (ref 11.4–15.0)

## 2015-02-19 MED ORDER — METHYLPREDNISOLONE SODIUM SUCC 125 MG IJ SOLR
60.0000 mg | Freq: Four times a day (QID) | INTRAMUSCULAR | Status: DC
  Administered 2015-02-19 – 2015-02-20 (×4): 60 mg via INTRAVENOUS
  Filled 2015-02-19 (×4): qty 2

## 2015-02-19 MED ORDER — FUROSEMIDE 10 MG/ML IJ SOLN
20.0000 mg | Freq: Two times a day (BID) | INTRAMUSCULAR | Status: DC
  Administered 2015-02-19 – 2015-02-21 (×5): 20 mg via INTRAVENOUS
  Filled 2015-02-19 (×5): qty 2

## 2015-02-19 MED ORDER — LEVOFLOXACIN IN D5W 250 MG/50ML IV SOLN
250.0000 mg | INTRAVENOUS | Status: DC
  Filled 2015-02-19 (×2): qty 50

## 2015-02-19 MED ORDER — PIPERACILLIN-TAZOBACTAM 3.375 G IVPB 30 MIN
3.3750 g | Freq: Three times a day (TID) | INTRAVENOUS | Status: DC
  Administered 2015-02-19 – 2015-02-20 (×3): 3.375 g via INTRAVENOUS
  Filled 2015-02-19 (×7): qty 50

## 2015-02-19 NOTE — Progress Notes (Addendum)
Per Medicine MD patient is not medially stable for D/C today. Clinical Education officer, museum (CSW) made [employer] at [facility] aware of above. Plan is for patient to D/C to [facility] when medically stable.   CSW met with patient's daughter Helene Kelp to discuss long term care options. Per daughter patient has a long term care policy. CSW encouraged daughter to bring long term care policy to [facility]. CSW also made daughter aware that long term care will be private pay or Medicaid. Per daughter patient will not qualify for Medicaid now. CSW will continue to follow and assist as needed.   Blima Rich, [place] [phone]

## 2015-02-19 NOTE — Progress Notes (Signed)
Physical Therapy Treatment Patient Details Name: Holly Drake MRN: [id number] DOB: 02/23/1924 Today's Date: 02/19/2015    History of Present Illness Holly Drake is an 81 yo Female, pleasant with dementia/parkinson's disease presents to ED with right hip pain; Patient fell at [facility]; X-ray shows commin7uted fracture of lesser/greater trochanter; Patient is s/p ORIF of RLE; WBAT;     PT Comments    Pt agreeable to PT; however, agitated throughout session. Difficulty following or willing to follow some instruction/commands. Pt initially wishes up in chair, but becomes increasing agitated with bed mobility and never attains full sit at edge of bed. Pt eventually resists and attempts to move self back into bed. Requires Max assist to do so though. Fair participation with exercises, but continues to yell "your hurting me" at times with RLE movement. Discussed prior level of function with daughter and benefit of current therapy as cognition allows for optimal healing. Also discussed O2 levels, which are low throughout session from 88-91% on 2L O2. Pt tends to breathe through her mouth despite instruction on pursed lip breathing.   Follow Up Recommendations  SNF     Equipment Recommendations  Rolling walker with 5" wheels    Recommendations for Other Services       Precautions / Restrictions Precautions Precautions: Fall Restrictions Weight Bearing Restrictions: Yes RLE Weight Bearing: Weight bearing as tolerated    Mobility  Bed Mobility Overal bed mobility: Needs Assistance Bed Mobility: Sit to Supine Rolling:  (unable to achieve full sit position; pt agitated/yelling)   Supine to sit: Max assist (unable to attain full sit; pt becomes agitated/yelling) Sit to supine: Max assist      Transfers                    Ambulation/Gait                 Stairs            Wheelchair Mobility    Modified Rankin (Stroke Patients Only)        Balance                                    Cognition Arousal/Alertness: Lethargic Behavior During Therapy: Agitated Overall Cognitive Status: Impaired/Different from baseline Area of Impairment: Orientation;Attention;Following commands;Safety/judgement;Awareness (according to daughter) Orientation Level: Situation;Place Current Attention Level: Alternating Memory: Decreased short-term memory Following Commands: Follows one step commands consistently (but becomes agitated with task)            Exercises Total Joint Exercises Ankle Circles/Pumps: AROM;Both;20 reps;Supine Hip ABduction/ADduction: AAROM;Both;15 reps;Supine General Exercises - Lower Extremity Quad Sets:  (attempted; does not follow command) Gluteal Sets:  (attempted; does not follow command) Short Arc Quad: AAROM;Both;15 reps;Supine Heel Slides: AAROM;Both;15 reps;Supine Straight Leg Raises: AAROM;Right;Supine (3 repetitions; pt yelling "it hurts" therefore stopped )    General Comments        Pertinent Vitals/Pain Pain Assessment:  (Initially reports stomach pain; later yells due to RLE pain) Pain Location:  (stomach; RLE) Pain Descriptors / Indicators:  (stomach is nauseated; cannot quantify/qualify RLE pain) Pain Intervention(s): Limited activity within patient's tolerance;RN gave pain meds during session    Home Living                      Prior Function            PT Goals (current  goals can now be found in the care plan section) Progress towards PT goals: Not progressing toward goals - comment    Frequency  BID    PT Plan Current plan remains appropriate    Co-evaluation             End of Session   Activity Tolerance: Patient limited by pain (limited by cognition/agitation) Patient left: in bed;with call bell/phone within reach;with bed alarm set;with family/visitor present     Time: [id number] PT Time Calculation (min) (ACUTE ONLY): 27 min  Charges:   $Therapeutic Exercise: 8-22 mins $Therapeutic Activity: 8-22 mins                    G Codes:      Holly Drake 02/19/2015, 10:42 AM

## 2015-02-19 NOTE — Progress Notes (Signed)
[facility] -  at [facility]   PATIENT NAME: Holly Drake    MR#:  [id number]  DATE OF BIRTH:  06/11/1924  SUBJECTIVE:  CHIEF COMPLAINT:   Chief Complaint  Patient presents with  . Fall   - Patient with dementia and Parkinson's disease, admitted after a fall and noted to have a right proximal femoral fracture. -Today patient is still  very confused and using some accessory muscles to breathe but no audible wheezing . Daughter at bedside Stat chest x-ray and ABGs  Ordered.  REVIEW OF SYSTEMS:  ROS  Unable to perform review of system in view of dementia DRUG ALLERGIES:  No Known Allergies  VITALS:  Blood pressure 145/78, pulse 99, temperature 97.9 F (36.6 C), temperature source Oral, resp. rate 18, height 5' (1.524 m), weight 71.487 kg (157 lb 9.6 oz), SpO2 91 %.  PHYSICAL EXAMINATION:  Physical Exam  GENERAL:  81 y.o.-year-old patient lying in the bed with no acute distress. bair Journalist, newspaper in place. EYES: Pupils equal, round, reactive to light and accommodation. No scleral icterus. Extraocular muscles intact.  HEENT: Head atraumatic, normocephalic. Oropharynx and nasopharynx clear.  NECK:  Supple, no jugular venous distention. No thyroid enlargement, no tenderness.  LUNGS: End-expiratory wheezing present with respiratory distress, some rhonchi or present .  using accessory muscles  CARDIOVASCULAR: S1, S2 normal. 3/6 systolic murmur present, no rubs, or gallops.  ABDOMEN: Soft, nontender, nondistended. Bowel sounds present. No organomegaly or mass.  EXTREMITIES: 1+ pedal edema present, no cyanosis, or clubbing. Ice pack in place at right hip from recent surgery, dressing with some soaked blood NEUROLOGIC: Sedated slightly. Moving all extremities in bed. Following some simple commands.  PSYCHIATRIC: The patient is alert and not oriented.  SKIN: No obvious rash, lesion, or ulcer.    LABORATORY PANEL:   CBC  Recent Labs Lab 02/19/15 0346  WBC  6.8  HGB 8.4*  HCT 25.4*  PLT 195   ------------------------------------------------------------------------------------------------------------------  Chemistries   Recent Labs Lab 02/15/15 2102  02/19/15 0346  NA 134*  < > 136  K 3.9  < > 4.5  CL 90*  < > 93*  CO2 33*  < > 33*  GLUCOSE 147*  < > 206*  BUN 15  < > 23*  CREATININE 0.71  < > 0.61  CALCIUM 9.0  < > 8.7*  AST 23  --   --   ALT 14  --   --   ALKPHOS 87  --   --   BILITOT 0.4  --   --   < > = values in this interval not displayed. ------------------------------------------------------------------------------------------------------------------  Cardiac Enzymes  Recent Labs Lab 02/15/15 2052  TROPONINI <0.03   ------------------------------------------------------------------------------------------------------------------  RADIOLOGY:  Dg Chest 1 View  02/18/2015   CLINICAL DATA:  Increase short of breath. COPD. Recent femur fixation  EXAM: CHEST  1 VIEW  COMPARISON:  02/15/2015  FINDINGS: The cardiac silhouette is normal volume. There is a smooth round contour to the right hilum which is more prominent than radiograph several days prior and is felt represent benign vascular contour.  There is a new left pleural effusion. No overt pulmonary edema. No pneumothorax. Chronic rib fractures noted along the right lateral chest wall  IMPRESSION: 1. New left effusion. 2. Prominent right hilum is felt to represent a benign vascular shadow.   Electronically Signed   By: Genevive Bi M.D.   On: 02/18/2015 12:20    EKG:   Orders placed or  performed during the hospital encounter of 02/15/15  . EKG 12-Lead  . EKG 12-Lead    ASSESSMENT AND PLAN:   81y/oF with PMH of Dementia, COPD, depression admitted after a fall adn right proximal femoral fracture * Acute respiratory distress probably secondary to acute exacerbation of COPD/possible underlying pneumonia with pulmonary congestion -Chest x-ray, ABGs are  ordered Patient's started on IV Solu-Medrol is increased to 60 mg IV every 6 and antibiotics Zosyn and Levaquin. Provide nebulizer treatments -IV fluids are discontinued. Get echocardiogram. Lasix IV -We'll continue close monitoring -We will consider BiPAP if needed -Monitor for aspiration-bedside swallow evaluation by RN  * Hip Fracture- s/p ORIF, mgmt per ortho - physical therapy, pain control - POD #3 today, monitor closely - PT recommends skilled nursing facility   * Altered mental status -has an underlying Dementia with depression-, probably worse from sundowning,  monitor, cont home meds- on celexa  * HTN- metoprolol, norvasc and lasix changed to daily basis from every other day, losartan- monitor and hold if needed if she is hypotensive  * DVT prophylaxis- started Coumadin per pharmacy management. INR today at 3.86. Discontinued Lovenox subcutaneous Social worker consult, follow up with care management regarding placement  All the records are reviewed and case discussed with Care Management/Social Workerr. Management plans discussed with the patient's daughter and she is in agreement.  CODE STATUS: Full Code  TOTAL CRITICAL CARE TIME TAKING CARE OF THIS PATIENT: 45 minutes.   POSSIBLE D/C IN1-2  DAYS, DEPENDING ON CLINICAL CONDITION.   Ramonita LabGouru, Christion Leonhard M.D on 02/19/2015 at 2:04 PM  Between 7am to 6pm - Pager - [phone]  After 6pm go to [email or web address] - password [email or web address] [facility]  [facility]  Office  [phone]  CC: Primary care physician; No PCP Per Patient

## 2015-02-19 NOTE — Discharge Instructions (Signed)
HIP FRACTURE POSTOPERATIVE DIRECTIONS  Hip Rehabilitation, Guidelines Following Surgery  The results of a hip operation are greatly improved after range of motion and muscle strengthening exercises. Follow all safety measures which are given to protect your hip. If any of these exercises cause increased pain or swelling in your joint, decrease the amount until you are comfortable again. Then slowly increase the exercises. Call your caregiver if you have problems or questions.   HOME CARE INSTRUCTIONS  Remove items at home which could result in a fall. This includes throw rugs or furniture in walking pathways.   ICE to the affected hip every three hours for 30 minutes at a time and then as needed for pain and swelling.  Continue to use ice on the hip for pain and swelling from surgery. You may notice swelling that will progress down to the foot and ankle.  This is normal after surgery.  Elevate the leg when you are not up walking on it.    Continue to use the breathing machine which will help keep your temperature down.  It is common for your temperature to cycle up and down following surgery, especially at night when you are not up moving around and exerting yourself.  The breathing machine keeps your lungs expanded and your temperature down.  DIET You may resume your previous home diet once your are discharged from the hospital.  DRESSING / WOUND CARE / SHOWERING You may start showering once staples have been removed. Change dressing as needed.    ACTIVITY Walk with your walker as instructed. Use walker as long as suggested by your caregivers. Avoid periods of inactivity such as sitting longer than an hour when not asleep. This helps prevent blood clots.  You may resume a sexual relationship in one month or when given the OK by your doctor.  You may return to work once you are cleared by your doctor.  Do not drive a car for 6 weeks or until released by you surgeon.  Do not drive while  taking narcotics.  WEIGHT BEARING Weight bearing as tolerated  POSTOPERATIVE CONSTIPATION PROTOCOL Constipation - defined medically as fewer than three stools per week and severe constipation as less than one stool per week.  One of the most common issues patients have following surgery is constipation.  Even if you have a regular bowel pattern at home, your normal regimen is likely to be disrupted due to multiple reasons following surgery.  Combination of anesthesia, postoperative narcotics, change in appetite and fluid intake all can affect your bowels.  In order to avoid complications following surgery, here are some recommendations in order to help you during your recovery period.  Colace (docusate) - Pick up an over-the-counter form of Colace or another stool softener and take twice a day as long as you are requiring postoperative pain medications.  Take with a full glass of water daily.  If you experience loose stools or diarrhea, hold the colace until you stool forms back up.  If your symptoms do not get better within 1 week or if they get worse, check with your doctor.  Dulcolax (bisacodyl) - Pick up over-the-counter and take as directed by the product packaging as needed to assist with the movement of your bowels.  Take with a full glass of water.  Use this product as needed if not relieved by Colace only.   MiraLax (polyethylene glycol) - Pick up over-the-counter to have on hand.  MiraLax is a solution that will  increase the amount of water in your bowels to assist with bowel movements.  Take as directed and can mix with a glass of water, juice, soda, coffee, or tea.  Take if you go more than two days without a movement. Do not use MiraLax more than once per day. Call your doctor if you are still constipated or irregular after using this medication for 7 days in a row.  If you continue to have problems with postoperative constipation, please contact the office for further assistance and  recommendations.  If you experience "the worst abdominal pain ever" or develop nausea or vomiting, please contact the office immediatly for further recommendations for treatment.  ITCHING  If you experience itching with your medications, try taking only a single pain pill, or even half a pain pill at a time.  You can also use Benadryl over the counter for itching or also to help with sleep.   TED HOSE STOCKINGS Wear the elastic stockings on both legs for six weeks following surgery during the day but you may remove then at night for sleeping.  MEDICATIONS See your medication summary on the After Visit Summary that the nursing staff will review with you prior to discharge.  You may have some home medications which will be placed on hold until you complete the course of blood thinner medication.  It is important for you to complete the blood thinner medication as prescribed by your surgeon.  Continue your approved medications as instructed at time of discharge.  PRECAUTIONS If you experience chest pain or shortness of breath - call 911 immediately for transfer to the hospital emergency department.  If you develop a fever greater that 101 F, purulent drainage from wound, increased redness or drainage from wound, foul odor from the wound/dressing, or calf pain - CONTACT YOUR SURGEON.                                                   FOLLOW-UP APPOINTMENTS Make sure you keep all of your appointments after your operation with your surgeon and caregivers. You should call the office at the above phone number and make an appointment for approximately two weeks after the date of your surgery or on the date instructed by your surgeon outlined in the "After Visit Summary".  RANGE OF MOTION AND STRENGTHENING EXERCISES  These exercises are designed to help you keep full movement of your hip joint. Follow your caregiver's or physical therapist's instructions. Perform all exercises about fifteen times, three  times per day or as directed. Exercise both hips, even if you have had only one joint replacement. These exercises can be done on a training (exercise) mat, on the floor, on a table or on a bed. Use whatever works the best and is most comfortable for you. Use music or television while you are exercising so that the exercises are a pleasant break in your day. This will make your life better with the exercises acting as a break in routine you can look forward to.  Lying on your back, slowly slide your foot toward your buttocks, raising your knee up off the floor. Then slowly slide your foot back down until your leg is straight again.  Lying on your back spread your legs as far apart as you can without causing discomfort.  Lying on your side, raise your  upper leg and foot straight up from the floor as far as is comfortable. Slowly lower the leg and repeat.  Lying on your back, tighten up the muscle in the front of your thigh (quadriceps muscles). You can do this by keeping your leg straight and trying to raise your heel off the floor. This helps strengthen the largest muscle supporting your knee.  Lying on your back, tighten up the muscles of your buttocks both with the legs straight and with the knee bent at a comfortable angle while keeping your heel on the floor.      IF YOU ARE TRANSFERRED TO A SKILLED REHAB FACILITY If the patient is transferred to a skilled rehab facility following release from the hospital, a list of the current medications will be sent to the facility for the patient to continue.  When discharged from the skilled rehab facility, please have the facility set up the patient's Home Health Physical Therapy prior to being released. Also, the skilled facility will be responsible for providing the patient with their medications at time of release from the facility to include their pain medication, the muscle relaxants, and their blood thinner medication. If the patient is still at the rehab  facility at time of the two week follow up appointment, the skilled rehab facility will also need to assist the patient in arranging follow up appointment in our office and any transportation needs.  MAKE SURE YOU:  Understand these instructions.  Get help right away if you are not doing well or get worse.    Pick up stool softner and laxative for home use following surgery while on pain medications. Continue to use ice for pain and swelling after surgery. Do not use any lotions or creams on the incision until instructed by your surgeon.

## 2015-02-19 NOTE — Progress Notes (Signed)
Physical Therapy Treatment Patient Details Name: Holly Drake MRN: [id number] DOB: 07/07/1924 Today's Date: 02/19/2015    History of Present Illness Holly Drake is an 81 yo Female, pleasant with dementia/parkinson's disease presents to ED with right hip pain; [facility]; X-ray shows commin7uted fracture of lesser/greater trochanter; Patient is s/p ORIF of RLE; WBAT;     PT Comments    Pt continues to be agitated and mildly confused this p.m requesting her daughter "come back to the house". Pt asks who therapist is several times during session. Pt yells with movement of RLE, but did tolerate small ranges.   Follow Up Recommendations  SNF     Equipment Recommendations  Rolling walker with 5" wheels    Recommendations for Other Services       Precautions / Restrictions Precautions Precautions: Fall Restrictions Weight Bearing Restrictions: Yes RLE Weight Bearing: Weight bearing as tolerated    Mobility  Bed Mobility               General bed mobility comments: Refuses out of bed  Transfers                    Ambulation/Gait                 Stairs            Wheelchair Mobility    Modified Rankin (Stroke Patients Only)       Balance                                    Cognition Arousal/Alertness: Lethargic Behavior During Therapy: Agitated                        Exercises General Exercises - Lower Extremity Ankle Circles/Pumps: AROM;Both;20 reps;Supine Short Arc Quad: AAROM;Both;20 reps;Supine Heel Slides: AAROM;Both;20 reps;Supine Hip ABduction/ADduction: AAROM;Both;20 reps;Supine Straight Leg Raises: AAROM;Both;15 reps;Supine    General Comments        Pertinent Vitals/Pain Pain Assessment:  (States she is in a lot of pain)    Home Living                      Prior Function            PT Goals (current goals can now be found in the care plan section)  Progress towards PT goals: Not progressing toward goals - comment    Frequency  BID    PT Plan Current plan remains appropriate    Co-evaluation             End of Session   Activity Tolerance: Patient limited by pain Patient left: in bed;with call bell/phone within reach;with bed alarm set     Time: [id number] PT Time Calculation (min) (ACUTE ONLY): 20 min  Charges:  $Therapeutic Exercise: 8-22 mins                    G Codes:      [name] 02/19/2015, 2:18 PM

## 2015-02-19 NOTE — Progress Notes (Signed)
Pt had less episodes of yelling out as she did the night before. Had haldol before shift change and once this shift. Used bedpan x 1, incontinent the other times. Able to send UA to lab. Took medications without incident. Had episodes of wheezing and had one breathing treatment during the night. Able to cough up some yellow phlegm, but not enough for specimen.

## 2015-02-19 NOTE — Progress Notes (Signed)
   Subjective: 3 Days Post-Op Procedure(s) (LRB): INTRAMEDULLARY (IM) NAIL FEMORAL (Right) Patient reports pain as moderate.   Patient is well, and has had no acute complaints or problems. Resting. We will start therapy today.  Plan is to go Skilled nursing facility after hospital stay.  Objective: Vital signs in last 24 hours: Temp:  [97.5 F (36.4 C)-98.7 F (37.1 C)] 98.4 F (36.9 C) (06/06 0300) Pulse Rate:  [90-92] 90 (06/06 0300) Resp:  [16-20] 20 (06/06 0300) BP: (117-120)/(47-60) 117/47 mmHg (06/06 0300) SpO2:  [90 %-93 %] 93 % (06/06 0300) Weight:  [71.487 kg (157 lb 9.6 oz)] 71.487 kg (157 lb 9.6 oz) (06/06 0500)  Intake/Output from previous day:   Intake/Output this shift:     Recent Labs  02/16/15 1517 02/17/15 0402 02/18/15 0330 02/19/15 0346  HGB 11.1* 9.0* 8.2* 8.4*    Recent Labs  02/18/15 0330 02/19/15 0346  WBC 8.4 6.8  RBC 2.77* 2.84*  HCT 24.9* 25.4*  PLT 158 195    Recent Labs  02/17/15 0402 02/19/15 0346  NA 136 136  K 4.3 4.5  CL 98* 93*  CO2 30 33*  BUN 18 23*  CREATININE 0.64 0.61  GLUCOSE 143* 206*  CALCIUM 8.1* 8.7*    Recent Labs  02/18/15 0330 02/19/15 0346  INR 2.68 3.86    EXAM General - Patient is Confused Extremity - Neurologically intact Neurovascular intact Sensation intact distally Dorsiflexion/Plantar flexion intact No cellulitis present Dressing - dressing C/D/I Motor Function - intact, moving foot and toes well on exam.   Past Medical History  Diagnosis Date  . COPD (chronic obstructive pulmonary disease)   . Dementia   . OA (osteoarthritis)     Assessment/Plan:   3 Days Post-Op Procedure(s) (LRB): INTRAMEDULLARY (IM) NAIL FEMORAL (Right) Principal Problem:   Hip fracture requiring operative repair Active Problems:   Dementia  Estimated body mass index is 30.78 kg/(m^2) as calculated from the following:   Height as of this encounter: 5' (1.524 m).   Weight as of this encounter: 71.487  kg (157 lb 9.6 oz). Up with therapy Discharge to SNF  Follow up with [facility] in 2 weeks  DVT Prophylaxis - Coumadin Weight-Bearing as tolerated to right leg D/C O2 and Pulse OX and try on Room Air  T. Cranston Neighborhris Gaines, PA-C [facility] 02/19/2015, 8:00 AM

## 2015-02-20 ENCOUNTER — Inpatient Hospital Stay (HOSPITAL_COMMUNITY)
Admit: 2015-02-20 | Discharge: 2015-02-20 | Disposition: A | Discharge status: Home / Self Care | Payer: Medicare Other | Attending: Internal Medicine | Admitting: Internal Medicine

## 2015-02-20 ENCOUNTER — Encounter: Payer: Self-pay | Admitting: Radiology

## 2015-02-20 ENCOUNTER — Inpatient Hospital Stay: Payer: Medicare Other

## 2015-02-20 DIAGNOSIS — R0600 Dyspnea, unspecified: Secondary | ICD-10-CM

## 2015-02-20 DIAGNOSIS — I2782 Chronic pulmonary embolism: Secondary | ICD-10-CM

## 2015-02-20 DIAGNOSIS — F0390 Unspecified dementia without behavioral disturbance: Secondary | ICD-10-CM

## 2015-02-20 DIAGNOSIS — J441 Chronic obstructive pulmonary disease with (acute) exacerbation: Secondary | ICD-10-CM

## 2015-02-20 LAB — BASIC METABOLIC PANEL
Anion gap: 11 (ref 5–15)
BUN: 20 mg/dL (ref 6–20)
CO2: 38 mmol/L — ABNORMAL HIGH (ref 22–32)
Calcium: 8.3 mg/dL — ABNORMAL LOW (ref 8.9–10.3)
Chloride: 90 mmol/L — ABNORMAL LOW (ref 101–111)
Creatinine, Ser: 0.67 mg/dL (ref 0.44–1.00)
GFR calc Af Amer: >60 mL/min (ref >60)
GFR calc non Af Amer: >60 mL/min (ref >60)
Glucose, Bld: 187 mg/dL — ABNORMAL HIGH (ref 65–99)
Potassium: 3.1 mmol/L — ABNORMAL LOW (ref 3.5–5.1)
Sodium: 139 mmol/L (ref 135–145)

## 2015-02-20 LAB — BLOOD GAS, ARTERIAL
Acid-Base Excess: 19.2 mmol/L — ABNORMAL HIGH (ref 0.0–3.0)
Allens test (pass/fail): POSITIVE — AB
Bicarbonate: 45.5 mEq/L — ABNORMAL HIGH (ref 21.0–28.0)
FIO2: 0.28 %
O2 Saturation: 88.7 %
PCO2 ART: 57 mmHg — AB (ref 32.0–48.0)
PH ART: 7.51 — AB (ref 7.350–7.450)
Patient temperature: 37
pO2, Arterial: 50 mmHg — ABNORMAL LOW (ref 83.0–108.0)

## 2015-02-20 LAB — CBC
HEMATOCRIT: 28.6 % — AB (ref 35.0–47.0)
Hemoglobin: 9.1 g/dL — ABNORMAL LOW (ref 12.0–16.0)
MCH: 28.6 pg (ref 26.0–34.0)
MCHC: 31.9 g/dL — ABNORMAL LOW (ref 32.0–36.0)
MCV: 89.5 fL (ref 80.0–100.0)
Platelets: 316 10*3/uL (ref 150–440)
RBC: 3.2 MIL/uL — ABNORMAL LOW (ref 3.80–5.20)
RDW: 13.8 % (ref 11.5–14.5)
WBC: 11.9 10*3/uL — ABNORMAL HIGH (ref 3.6–11.0)

## 2015-02-20 LAB — PHOSPHORUS: Phosphorus: 2.3 mg/dL — ABNORMAL LOW (ref 2.5–4.6)

## 2015-02-20 LAB — GLUCOSE, CAPILLARY: Glucose-Capillary: 198 mg/dL — ABNORMAL HIGH (ref 65–99)

## 2015-02-20 LAB — MAGNESIUM: Magnesium: 1.6 mg/dL — ABNORMAL LOW (ref 1.7–2.4)

## 2015-02-20 LAB — POTASSIUM: POTASSIUM: 3.2 mmol/L — AB (ref 3.5–5.1)

## 2015-02-20 LAB — PROTIME-INR
INR: 3.98
PROTHROMBIN TIME: 38.8 s — AB (ref 11.4–15.0)

## 2015-02-20 MED ORDER — LORAZEPAM 0.5 MG PO TABS: 0.5000 mg | ORAL_TABLET | Freq: Four times a day (QID) | ORAL | Status: DC | PRN

## 2015-02-20 MED ORDER — TIOTROPIUM BROMIDE MONOHYDRATE 18 MCG IN CAPS
18.0000 ug | ORAL_CAPSULE | Freq: Every day | RESPIRATORY_TRACT | Status: DC
  Administered 2015-02-20 – 2015-02-21 (×2): 18 ug via RESPIRATORY_TRACT
  Filled 2015-02-20: qty 5

## 2015-02-20 MED ORDER — IOHEXOL 350 MG/ML SOLN
100.0000 mL | Freq: Once | INTRAVENOUS | Status: AC | PRN
  Administered 2015-02-20: 100 mL via INTRAVENOUS

## 2015-02-20 MED ORDER — MOMETASONE FURO-FORMOTEROL FUM 200-5 MCG/ACT IN AERO
2.0000 | INHALATION_SPRAY | Freq: Two times a day (BID) | RESPIRATORY_TRACT | Status: DC
  Administered 2015-02-20: 2 via RESPIRATORY_TRACT
  Filled 2015-02-20: qty 8.8

## 2015-02-20 MED ORDER — POTASSIUM & SODIUM PHOSPHATES 280-160-250 MG PO PACK
1.0000 | PACK | Freq: Three times a day (TID) | ORAL | Status: AC
  Administered 2015-02-20 – 2015-02-21 (×3): 1 via ORAL
  Filled 2015-02-20 (×2): qty 1

## 2015-02-20 MED ORDER — POTASSIUM CHLORIDE 20 MEQ PO PACK
20.0000 meq | PACK | Freq: Every day | ORAL | Status: DC
  Administered 2015-02-21: 20 meq via ORAL
  Filled 2015-02-20: qty 1

## 2015-02-20 MED ORDER — ALBUTEROL SULFATE (2.5 MG/3ML) 0.083% IN NEBU
2.5000 mg | INHALATION_SOLUTION | RESPIRATORY_TRACT | Status: DC
  Administered 2015-02-20 – 2015-02-21 (×5): 2.5 mg via RESPIRATORY_TRACT
  Filled 2015-02-20 (×6): qty 3

## 2015-02-20 MED ORDER — METHYLPREDNISOLONE SODIUM SUCC 40 MG IJ SOLR
40.0000 mg | INTRAMUSCULAR | Status: DC
  Administered 2015-02-21: 40 mg via INTRAVENOUS
  Filled 2015-02-20: qty 1

## 2015-02-20 MED ORDER — MAGNESIUM SULFATE 2 GM/50ML IV SOLN
2.0000 g | Freq: Once | INTRAVENOUS | Status: AC
  Administered 2015-02-20: 2 g via INTRAVENOUS
  Filled 2015-02-20: qty 50

## 2015-02-20 MED ORDER — BUDESONIDE 0.5 MG/2ML IN SUSP
0.5000 mg | Freq: Two times a day (BID) | RESPIRATORY_TRACT | Status: DC
  Administered 2015-02-20 – 2015-02-21 (×2): 0.5 mg via RESPIRATORY_TRACT
  Filled 2015-02-20 (×2): qty 2

## 2015-02-20 MED ORDER — POTASSIUM CHLORIDE 2 MEQ/ML IV SOLN
Freq: Once | INTRAVENOUS | Status: AC
  Administered 2015-02-20: 20:00:00 via INTRAVENOUS
  Filled 2015-02-20: qty 20

## 2015-02-20 MED ORDER — POTASSIUM CHLORIDE 20 MEQ PO PACK
40.0000 meq | PACK | Freq: Once | ORAL | Status: AC
  Administered 2015-02-20: 40 meq via ORAL
  Filled 2015-02-20: qty 2

## 2015-02-20 NOTE — Progress Notes (Signed)
PT Cancellation Note  Patient Details Name: Holly Drake MRN: [id number] DOB: 06/05/1924   Cancelled Treatment:    Reason Eval/Treat Not Completed: Medical issues which prohibited therapy (Pt being transferred to CCU for Bipap )   Holly Drake 02/20/2015, 11:13 AM

## 2015-02-20 NOTE — Progress Notes (Signed)
Patient is transferring to ICU for Bipap. Clinical Child psychotherapistocial Worker (CSW) gave report to ICU CSW. [facility] and plan is to [facility]. Daughter Rosey Batheresa is open to palliative and was asking about [facility]. CSW requested palliative consult. CSW will continue to follow and assist as needed.  Jetta LoutBailey Morgan, LCSWA [phone]

## 2015-02-20 NOTE — Progress Notes (Signed)
   Subjective: 4 Days Post-Op Procedure(s) (LRB): INTRAMEDULLARY (IM) NAIL FEMORAL (Right)  Pleural effusion per x-ray Patient reports pain as mild.   Patient is well, and has had no acute complaints or problems We will start therapy today.  Plan is to go Rehab after hospital stay. no nausea and no vomiting Patient denies any chest pains , no change in her breathing according to the patient. Objective: Vital signs in last 24 hours: Temp:  [97.5 F (36.4 C)-99 F (37.2 C)] 98.5 F (36.9 C) (06/07 0433) Pulse Rate:  [89-113] 89 (06/07 0433) Resp:  [18-20] 18 (06/07 0433) BP: (137-152)/(53-78) 140/68 mmHg (06/07 0433) SpO2:  [90 %-97 %] 97 % (06/07 0433) well approximated incision. No signs of redness or infection. Does have a bruise to the posterior medial right thigh. Heels are non tender and elevated off the bed using rolled towels Intake/Output from previous day: 06/06 0701 - 06/07 0700 In: -  Out: 300 [Urine:300] Intake/Output this shift:     Recent Labs  02/18/15 0330 02/19/15 0346 02/20/15 0430  HGB 8.2* 8.4* 9.1*    Recent Labs  02/19/15 0346 02/20/15 0430  WBC 6.8 11.9*  RBC 2.84* 3.20*  HCT 25.4* 28.6*  PLT 195 316    Recent Labs  02/19/15 0346 02/20/15 0430  NA 136 139  K 4.5 3.1*  CL 93* 90*  CO2 33* 38*  BUN 23* 20  CREATININE 0.61 0.67  GLUCOSE 206* 187*  CALCIUM 8.7* 8.3*    Recent Labs  02/19/15 0346 02/20/15 0430  INR 3.86 3.98    EXAM General - Patient is Alert and Appropriate Extremity - Neurologically intact Neurovascular intact Sensation intact distally Intact pulses distally Dorsiflexion/Plantar flexion intact Dressing - scant drainage Motor Function - intact, moving foot and toes well on exam. Has good gross motor strength against resistance of dorsiflexion and plantarflexion of the foot.  Past Medical History  Diagnosis Date  . COPD (chronic obstructive pulmonary disease)   . Dementia   . OA (osteoarthritis)      Assessment/Plan: 4 Days Post-Op Procedure(s) (LRB): INTRAMEDULLARY (IM) NAIL FEMORAL (Right) Principal Problem:   Hip fracture requiring operative repair Active Problems:   Dementia  Estimated body mass index is 30.78 kg/(m^2) as calculated from the following:   Height as of this encounter: 5' (1.524 m).   Weight as of this encounter: 71.487 kg (157 lb 9.6 oz). Up with therapy Discharge to SNF  Labs: Potassium down to 3.1 with WBCs and elevated to 11.9. Possibly secondary to pleural effusion DVT Prophylaxis - Coumadin and TED hose , compression leg pumps Weight-Bearing as tolerated to right leg Needs to work on bowel movement today D/C O2 and Pulse OX and try on Room [employer]  Jayke Caul R. [facility] PA [facility] 02/20/2015, 6:57 AM

## 2015-02-20 NOTE — Progress Notes (Signed)
OT Cancellation Note  Patient Details Name: Holly Drake MRN: [id number] DOB: 12/09/1923   Cancelled Treatment:     Attempted OT treatment unable to see secondary to medical reasons.  Ocie CornfieldHuff, Aysel Gilchrest M 02/20/2015, 11:30 AM

## 2015-02-20 NOTE — Consult Note (Signed)
Pt seen.  Pt A/O x 3 and acute problems discussed.  Pt updated on current status and pt agrees to Bi-Pap and transfwer to CCU.  Pt states if Bi-Pap fails she would want to be intubated short term.  See full not to follow.

## 2015-02-20 NOTE — Progress Notes (Signed)
  Subjective: 4 Days Post-Op Procedure(s) (LRB): INTRAMEDULLARY (IM) NAIL FEMORAL (Right) Patient reports pain as moderate.   Patient is well, and has had no acute complaints or problems. Resting. States breathing is improving. We will continue with PT today Plan is to go to Skilled nursing facility after hospital stay.   Objective: Vital signs in last 24 hours: Temp:  [97.5 F (36.4 C)-99 F (37.2 C)] 98.5 F (36.9 C) (06/07 0433) Pulse Rate:  [89-113] 89 (06/07 0433) Resp:  [18-20] 18 (06/07 0433) BP: (137-152)/(53-78) 140/68 mmHg (06/07 0433) SpO2:  [90 %-97 %] 97 % (06/07 0433)  Intake/Output from previous day: 06/06 0701 - 06/07 0700 In: -  Out: 300 [Urine:300] Intake/Output this shift:     Recent Labs  02/18/15 0330 02/19/15 0346 02/20/15 0430  HGB 8.2* 8.4* 9.1*    Recent Labs  02/19/15 0346 02/20/15 0430  WBC 6.8 11.9*  RBC 2.84* 3.20*  HCT 25.4* 28.6*  PLT 195 316    Recent Labs  02/19/15 0346 02/20/15 0430  NA 136 139  K 4.5 3.1*  CL 93* 90*  CO2 33* 38*  BUN 23* 20  CREATININE 0.61 0.67  GLUCOSE 206* 187*  CALCIUM 8.7* 8.3*    Recent Labs  02/19/15 0346 02/20/15 0430  INR 3.86 3.98    EXAM General - Patient is Confused, alert. Extremity - Neurologically intact Neurovascular intact Sensation intact distally Dorsiflexion/Plantar flexion intact No cellulitis present Dressing - dressing C/D/I Motor Function - intact, moving foot and toes well on exam.   Past Medical History  Diagnosis Date  . COPD (chronic obstructive pulmonary disease)   . Dementia   . OA (osteoarthritis)     Assessment/Plan:   4 Days Post-Op Procedure(s) (LRB): INTRAMEDULLARY (IM) NAIL FEMORAL (Right) Principal Problem:   Hip fracture requiring operative repair Active Problems:   Dementia  Estimated body mass index is 30.78 kg/(m^2) as calculated from the following:   Height as of this encounter: 5' (1.524 m).   Weight as of this encounter: 71.487  kg (157 lb 9.6 oz). Continue with physical therapy Follow up with [facility] in 2 weeks   DVT Prophylaxis - Coumadin Weight-Bearing as tolerated to right leg D/C O2 and Pulse OX and try on Room Air  T. Cranston Neighborhris Gaines, PA-C [facility] 02/20/2015, 6:59 AM

## 2015-02-20 NOTE — Progress Notes (Signed)
ANTICOAGULATION CONSULT NOTE - Follow up Consult  Pharmacy Consult for Coumadin Indication: VTE prophylaxis  No Known Allergies  Patient Measurements: Height: 5' (152.4 cm) Weight: 157 lb 9.6 oz (71.487 kg) IBW/kg (Calculated) : 45.5   Vital Signs: Temp: 98.5 F (36.9 C) (06/07 0433) Temp Source: Oral (06/07 0433) BP: 140/68 mmHg (06/07 0433) Pulse Rate: 89 (06/07 0433)  Labs:  Recent Labs  02/18/15 0330 02/19/15 0346 02/20/15 0430  HGB 8.2* 8.4* 9.1*  HCT 24.9* 25.4* 28.6*  PLT 158 195 316  LABPROT 28.6* 37.9* 38.8*  INR 2.68 3.86 3.98  CREATININE  --  0.61 0.67    Estimated Creatinine Clearance: 42.1 mL/min (by C-G formula based on Cr of 0.67).   Medical History: Past Medical History  Diagnosis Date  . COPD (chronic obstructive pulmonary disease)   . Dementia   . OA (osteoarthritis)     Medications:  Scheduled:  . amLODipine  10 mg Oral Daily  . calcium carbonate  1 tablet Oral Daily  . chlorhexidine  15 mL Mouth/Throat TID  . cholecalciferol  2,000 Units Oral Once per day on Mon Thu  . citalopram  20 mg Oral QHS  . docusate sodium  100 mg Oral BID  . furosemide  20 mg Intravenous Q12H  . levofloxacin (LEVAQUIN) IV  250 mg Intravenous Q24H  . losartan  100 mg Oral Daily  . methylPREDNISolone (SOLU-MEDROL) injection  60 mg Intravenous Q6H  . metoprolol tartrate  75 mg Oral BID  . mometasone-formoterol  2 puff Inhalation BID  . pantoprazole  40 mg Oral Daily  . piperacillin-tazobactam  3.375 g Intravenous 3 times per day  . sulfaSALAzine  500 mg Oral BID WC  . Warfarin - Pharmacist Dosing Inpatient   Does not apply q1800   Infusions:    PRN: acetaminophen **OR** acetaminophen, albuterol, alum & mag hydroxide-simeth, bisacodyl, haloperidol lactate, HYDROcodone-acetaminophen, magnesium hydroxide, menthol-cetylpyridinium **OR** phenol, methocarbamol **OR** methocarbamol (ROBAXIN)  IV, metoCLOPramide **OR** metoCLOPramide (REGLAN) injection, morphine  injection, morphine injection, ondansetron **OR** ondansetron (ZOFRAN) IV  Assessment: 81 yo female s/p ORIF hip with a h/o DVT ordered Lovenox and Coumadin for DVT prophylaxis.  Current orders for warfarin 3 mg for tonight. Large increase in INR overnight.   Started on levofloxacin 6/5, pt is also on Zosyn, methylprednisolone.  Goal of Therapy:  INR 2-3   Plan:  6/4 INR = 1.2.  Will continue Coumadin 5 mg daily and f/u AM INR.  6/5 INR = 2.68 No Coumadin tonight; f/u INR in AM 6/6 INR = 3.86. Coumadin on hold. INR in AM. 6/7 INR = 3.98 Coumadin on hold. INR in AM.  Elvera MariaSonja Tomi Paddock, PharmD  02/20/2015,7:18 AM

## 2015-02-20 NOTE — Consult Note (Signed)
Palliative Medicine Inpatient Consult Note   Name: Holly Drake Date: 02/20/2015 MRN: [id number]  DOB: 05/06/1924  Referring Physician: Ramonita LabAruna Gouru, MD  Palliative Care consult requested for this 81 y.o. female for goals of medical therapy in patient with COPD, dementia and OA. Pt admitted with R femur fx.  Holly Drake is an 81 year old female with a PMH of COPD, dementia, and OA.  Pt presented from facility after fall.  ER workup significant for R femur fx.  Pt is s/p ORIF.  Hospitalization has been complicated by acute respiratory failure requiring Bi-Pap and leukocytosis.    Pt resting in bed and appears SOB.  Pt is being transferred to CCU for Bi-Pap.   REVIEW OF SYSTEMS:  Pain: None Dyspnea:  Yes Nausea/Vomiting:  No Fatigue:   No All other systems were reviewed and found to be negative    SOCIAL HISTORY: Pt is widowed and has two children.  [facility].  reports that she has quit smoking. Her smoking use included Cigarettes. She has a 25 pack-year smoking history. She does not have any smokeless tobacco history on file. She reports that she does not drink alcohol.  LEGAL DOCUMENTS:  Advance Directives:  No, declined. Health Care Power of Attorney:  Yes.  CODE STATUS: Full code  PAST MEDICAL HISTORY: Past Medical History  Diagnosis Date  . COPD (chronic obstructive pulmonary disease)   . Dementia   . OA (osteoarthritis)   . Hypertension     PAST SURGICAL HISTORY:  Past Surgical History  Procedure Laterality Date  . Breast lumpectomy    . Tonsillectomy  2014    due to abscess  . Dilation and curettage, diagnostic / therapeutic    . Femur im nail Right 02/16/2015    Procedure: INTRAMEDULLARY (IM) NAIL FEMORAL;  Surgeon: Kennedy BuckerMichael Menz, MD;  Location: [facility] ORS;  Service: Orthopedics;  Laterality: Right;    ALLERGIES:  has No Known Allergies.  MEDICATIONS:  Current Facility-Administered Medications  Medication Dose Route Frequency Provider Last Rate  Last Dose  . acetaminophen (TYLENOL) tablet 650 mg  650 mg Oral Q6H PRN Arnaldo NatalMichael S Diamond, MD       Or  . acetaminophen (TYLENOL) suppository 650 mg  650 mg Rectal Q6H PRN Arnaldo NatalMichael S Diamond, MD      . albuterol (PROVENTIL) (2.5 MG/3ML) 0.083% nebulizer solution 2.5 mg  2.5 mg Nebulization Q4H PRN Ramonita LabAruna Gouru, MD   2.5 mg at 02/20/15 0427  . alum & mag hydroxide-simeth (MAALOX/MYLANTA) 200-200-20 MG/5ML suspension 30 mL  30 mL Oral Q4H PRN Kennedy BuckerMichael Menz, MD      . amLODipine (NORVASC) tablet 10 mg  10 mg Oral Daily Arnaldo NatalMichael S Diamond, MD   10 mg at 02/20/15 1031  . bisacodyl (DULCOLAX) suppository 10 mg  10 mg Rectal Daily PRN Kennedy BuckerMichael Menz, MD      . calcium carbonate (TUMS - dosed in mg elemental calcium) chewable tablet 200 mg of elemental calcium  1 tablet Oral Daily Arnaldo NatalMichael S Diamond, MD   200 mg of elemental calcium at 02/20/15 1031  . chlorhexidine (PERIDEX) 0.12 % solution 15 mL  15 mL Mouth/Throat TID Arnaldo NatalMichael S Diamond, MD   15 mL at 02/19/15 2200  . cholecalciferol (VITAMIN D) tablet 2,000 Units  2,000 Units Oral Once per day on Mon Thu Arnaldo NatalMichael S Diamond, MD   2,000 Units at 02/19/15 [phone]  . citalopram (CELEXA) tablet 20 mg  20 mg Oral QHS Arnaldo NatalMichael S Diamond,  MD   20 mg at 02/19/15 2047  . docusate sodium (COLACE) capsule 100 mg  100 mg Oral BID Arnaldo Natal, MD   100 mg at 02/20/15 1031  . furosemide (LASIX) injection 20 mg  20 mg Intravenous Q12H Ramonita Lab, MD   20 mg at 02/20/15 1030  . haloperidol lactate (HALDOL) injection 1 mg  1 mg Intravenous Q6H PRN Ramonita Lab, MD   1 mg at 02/19/15 0312  . HYDROcodone-acetaminophen (NORCO/VICODIN) 5-325 MG per tablet 1 tablet  1 tablet Oral Q6H PRN Arnaldo Natal, MD   1 tablet at 02/18/15 1627  . Levofloxacin (LEVAQUIN) IVPB 250 mg  250 mg Intravenous Q24H Aruna Gouru, MD      . LORazepam (ATIVAN) tablet 0.5 mg  0.5 mg Oral Q6H PRN Ramonita Lab, MD      . losartan (COZAAR) tablet 100 mg  100 mg Oral Daily Arnaldo Natal, MD   100 mg at  02/20/15 1030  . magnesium hydroxide (MILK OF MAGNESIA) suspension 30 mL  30 mL Oral Daily PRN Kennedy Bucker, MD   30 mL at 02/18/15 1557  . menthol-cetylpyridinium (CEPACOL) lozenge 3 mg  1 lozenge Oral PRN Kennedy Bucker, MD       Or  . phenol [facility]) mouth spray 1 spray  1 spray Mouth/Throat PRN Kennedy Bucker, MD      . methocarbamol (ROBAXIN) tablet 500 mg  500 mg Oral Q6H PRN Kennedy Bucker, MD       Or  . methocarbamol (ROBAXIN) 500 mg in dextrose 5 % 50 mL IVPB  500 mg Intravenous Q6H PRN Kennedy Bucker, MD      . methylPREDNISolone sodium succinate (SOLU-MEDROL) 125 mg/2 mL injection 60 mg  60 mg Intravenous Q6H Aruna Gouru, MD   60 mg at 02/20/15 1029  . metoCLOPramide (REGLAN) tablet 5-10 mg  5-10 mg Oral Q8H PRN Kennedy Bucker, MD       Or  . metoCLOPramide (REGLAN) injection 5-10 mg  5-10 mg Intravenous Q8H PRN Kennedy Bucker, MD      . metoprolol tartrate (LOPRESSOR) tablet 75 mg  75 mg Oral BID Arnaldo Natal, MD   75 mg at 02/20/15 1030  . mometasone-formoterol (DULERA) 100-5 MCG/ACT inhaler 2 puff  2 puff Inhalation BID Arnaldo Natal, MD   2 puff at 02/20/15 1024  . morphine 2 MG/ML injection 0.5 mg  0.5 mg Intravenous Q2H PRN Kennedy Bucker, MD   0.5 mg at 02/16/15 2006  . morphine 2 MG/ML injection 0.5 mg  0.5 mg Intravenous Q2H PRN Kennedy Bucker, MD      . ondansetron [facility]) tablet 4 mg  4 mg Oral Q6H PRN Kennedy Bucker, MD       Or  . ondansetron [facility]) injection 4 mg  4 mg Intravenous Q6H PRN Kennedy Bucker, MD      . pantoprazole (PROTONIX) EC tablet 40 mg  40 mg Oral Daily Arnaldo Natal, MD   40 mg at 02/20/15 1030  . piperacillin-tazobactam (ZOSYN) IVPB 3.375 g  3.375 g Intravenous 3 times per day Ramonita Lab, MD   3.375 g at 02/20/15 0445  . [START ON 02/21/2015] potassium chloride (KLOR-CON) packet 20 mEq  20 mEq Oral Daily Aruna Gouru, MD      . potassium chloride (KLOR-CON) packet 40 mEq  40 mEq Oral Once Ramonita Lab, MD      . sulfaSALAzine (AZULFIDINE) tablet 500  mg  500 mg Oral BID WC Kelton Pillar  Sheryle Hail, MD   500 mg at 02/20/15 1030  . Warfarin - Pharmacist Dosing Inpatient   Does not apply q1800 Ramonita Lab, MD        Vital Signs: BP 141/73 mmHg  Pulse 88  Temp(Src) 98.1 F (36.7 C) (Oral)  Resp 18  Ht 5' (1.524 m)  Wt 71.487 kg (157 lb 9.6 oz)  BMI 30.78 kg/m2  SpO2 92% Filed Weights   02/18/15 0145 02/19/15 0013 02/19/15 0500  Weight: 72.031 kg (158 lb 12.8 oz) 71.487 kg (157 lb 9.6 oz) 71.487 kg (157 lb 9.6 oz)    Estimated body mass index is 30.78 kg/(m^2) as calculated from the following:   Height as of this encounter: 5' (1.524 m).   Weight as of this encounter: 71.487 kg (157 lb 9.6 oz).  PERFORMANCE STATUS (ECOG) : 2 - Symptomatic, <50% confined to bed  PHYSICAL EXAM: Generall: NAD HEENT: OP clear, moist oral mucosa Neck: Trachea midline  Cardiovascular: diminished ant fields Pulmonary/Chest: Clear ant fields Abdominal: Soft, NTTP. Hypoactive bowel sounds GU: No SP tenderness, No CVA tenderness Extremities: No edema  Neurological: Grossly nonfocal Skin: Warm, dry and intact.  Psychiatric: Calm, A&O x 3  LABS: CBC:    Component Value Date/Time   WBC 11.9* 02/20/2015 0430   WBC 11.7* 03/02/2014 0432   HGB 9.1* 02/20/2015 0430   HGB 10.7* 03/02/2014 0432   HCT 28.6* 02/20/2015 0430   HCT 31.7* 03/02/2014 0432   PLT 316 02/20/2015 0430   PLT 253 03/02/2014 0432   MCV 89.5 02/20/2015 0430   MCV 93 03/02/2014 0432   NEUTROABS 6.9* 02/18/2015 0330   NEUTROABS 10.5* 03/02/2014 0432   LYMPHSABS 0.6* 02/18/2015 0330   LYMPHSABS 0.5* 03/02/2014 0432   MONOABS 0.9 02/18/2015 0330   MONOABS 0.6 03/02/2014 0432   EOSABS 0.0 02/18/2015 0330   EOSABS 0.0 03/02/2014 0432   BASOSABS 0.0 02/18/2015 0330   BASOSABS 0.0 03/02/2014 0432   Comprehensive Metabolic Panel:    Component Value Date/Time   NA 139 02/20/2015 0430   NA 135* 03/04/2014 0429   K 3.1* 02/20/2015 0430   K 3.6 03/04/2014 0429   CL 90* 02/20/2015 0430    CL 96* 03/04/2014 0429   CO2 38* 02/20/2015 0430   CO2 34* 03/04/2014 0429   BUN 20 02/20/2015 0430   BUN 15 03/04/2014 0429   CREATININE 0.67 02/20/2015 0430   CREATININE 0.61 03/04/2014 0429   GLUCOSE 187* 02/20/2015 0430   GLUCOSE 194* 03/04/2014 0429   CALCIUM 8.3* 02/20/2015 0430   CALCIUM 8.5 03/04/2014 0429   AST 23 02/15/2015 2102   AST 21 03/01/2014 0312   ALT 14 02/15/2015 2102   ALT 16 03/01/2014 0312   ALKPHOS 87 02/15/2015 2102   ALKPHOS 76 03/01/2014 0312   BILITOT 0.4 02/15/2015 2102   PROT 6.5 02/15/2015 2102   PROT 7.1 03/01/2014 0312   ALBUMIN 4.0 02/15/2015 2102   ALBUMIN 3.2* 03/01/2014 0312    IMPRESSION:  Holly Drake is an 81 year old female with a PMH of COPD, dementia, and OA.  Pt presented from facility after fall.  ER workup significant for R femur fx.  Pt is s/p ORIF.  Hospitalization has been complicated by acute respiratory failure requiring Bi-Pap and leukocytosis.    Pt is s/p ORIF and was doing well until today, when she started having respiratory distress. ABG reveals low O2 levels and increased CO2.  Spoke with pt about acute problems and she would like to  try Bi-Pap.  Pt states if Bi-Pap fails she would like to be intubated short term and would never want a trach or PEG.  Daughter not at bedside.  Will update when available.  PLAN: 1. Full code    More than 50% of the visit was spent in counseling/coordination of care: Yes  Time Spent: 75 minutes

## 2015-02-20 NOTE — Progress Notes (Deleted)
Physical Therapy Treatment Patient Details Name: Holly Drake MRN: [id number] DOB: [birth year] Today's Date: 02/20/2015    History of Present Illness Holly Drake is an 81 yo Female, pleasant with dementia/parkinson's disease presents to ED with right hip pain; Patient fell at [facility]; X-ray shows commin7uted fracture of lesser/greater trochanter; Patient is s/p ORIF of RLE; WBAT;     PT Comments    Pt agreeable to PT. Continues to be anxious with bed mobility, transfers and ambulation, but tolerating. Pt experiences shortness of breath several times with O2 saturation between 88 and 90% on 2L. Educated and encouraged pursed lip breathing with resolve of symptoms and improvement of O2 saturation to 93%. Noted pt has had little to eat this a.m.; encouraged increasing food intake especially protein to allow for optimal healing.   Follow Up Recommendations  SNF     Equipment Recommendations  Rolling walker with 5" wheels    Recommendations for Other Services       Precautions / Restrictions Precautions Precautions: Fall Restrictions Weight Bearing Restrictions: Yes RLE Weight Bearing: Weight bearing as tolerated    Mobility  Bed Mobility Overal bed mobility: Needs Assistance Bed Mobility: Sit to Supine Rolling: Mod assist            Transfers Overall transfer level: Needs assistance Equipment used: Rolling walker (2 wheeled) Transfers: Sit to/from Stand Sit to Stand: Mod assist         General transfer comment:  (Pt anxious; yells out in pain/fear)  Ambulation/Gait Ambulation/Gait assistance: Mod assist Ambulation Distance (Feet): 5 Feet Assistive device: Rolling walker (2 wheeled) Gait Pattern/deviations: Step-to pattern;Decreased step length - right;Decreased step length - left;Decreased stance time - left;Decreased weight shift to left;Antalgic Gait velocity: Reduced   General Gait Details: Antalgic on L; difficulty maintaining upright posture.  Attempts to sit prematurely, but able to complete several more steps with encouragment/assist   Stairs            Wheelchair Mobility    Modified Rankin (Stroke Patients Only)       Balance           Standing balance support: Bilateral upper extremity supported Standing balance-Leahy Scale: Poor                      Cognition Arousal/Alertness: Awake/alert Behavior During Therapy: Anxious Overall Cognitive Status: Impaired/Different from baseline                      Exercises Total Joint Exercises Ankle Circles/Pumps: AROM;Both;20 reps;Seated Quad Sets: Strengthening;Both;20 reps;Seated Gluteal Sets: Strengthening;Both;20 reps;Seated Towel Squeeze: Strengthening;20 reps;Both;Seated Short Arc Quad: AAROM;Left;20 reps;Seated (AROM on R) Heel Slides: AAROM;Left;20 reps;Seated (AROM on R) Hip ABduction/ADduction: AAROM;Left;20 reps;Seated (AROM on R)    General Comments        Pertinent Vitals/Pain Pain Assessment: 0-10 Pain Score: 8  Pain Location: L hip Pain Intervention(s): Limited activity within patient's tolerance;Monitored during session;Premedicated before session    Home Living                      Prior Function            PT Goals (current goals can now be found in the care plan section) Progress towards PT goals: Progressing toward goals    Frequency  BID    PT Plan Current plan remains appropriate    Co-evaluation  End of Session Equipment Utilized During Treatment: Gait belt Activity Tolerance: Patient tolerated treatment well;Patient limited by fatigue;Patient limited by pain Patient left: in chair;with call bell/phone within reach;with chair alarm set     Time: [id number] PT Time Calculation (min) (ACUTE ONLY): 32 min  Charges:  $Gait Training: 8-22 mins $Therapeutic Exercise: 8-22 mins                    G Codes:      Kristeen MissHeidi Elizabeth Drake 02/20/2015, 10:26 AM

## 2015-02-20 NOTE — Care Management (Signed)
Notified by Natalia Leatherwoodkatherine with speech that patient struggles to breathe when awake. MD relates to COPD. Fluids stopped by RN currently. Dr. Amado CoeGouru paged to rule out CHF/PE.

## 2015-02-20 NOTE — Evaluation (Signed)
Clinical/Bedside Swallow Evaluation Patient Details  Name: Holly Drake MRN: [id number] Date of Birth: 09/21/1923  Today's Date: 02/20/2015 Time: SLP Start Time (ACUTE ONLY): [id number] SLP Stop Time (ACUTE ONLY): 1049 SLP Time Calculation (min) (ACUTE ONLY): 51 min  Past Medical History:  Past Medical History  Diagnosis Date  . COPD (chronic obstructive pulmonary disease)   . Dementia   . OA (osteoarthritis)    Past Surgical History:  Past Surgical History  Procedure Laterality Date  . Breast lumpectomy    . Tonsillectomy  2014    due to abscess  . Dilation and curettage, diagnostic / therapeutic    . Femur im nail Right 02/16/2015    Procedure: INTRAMEDULLARY (IM) NAIL FEMORAL;  Surgeon: Holly BuckerMichael Menz, MD;  Location: [facility] ORS;  Service: Orthopedics;  Laterality: Right;   HPI:  pt presented to the hospital w/ a R femoral fx/repair s/p fall. Pt has a baseline of Dementia and COPD. She resided at a NH. Pt confused in the room pulling at her bandage on the R hip - bleeding noted so NSG informed and addressed. Pt required min.+ verbal cues to follow along w/ tasks(appropriately); [facility]. Noted continued increased RR/effort w/ any exertion so frequent rest breaks given.    Assessment / Plan / Recommendation Clinical Impression  Pt appears to present w/ an adequate oropharyngeal swallow function but is at increased risk for aspiration sec. to declined Respiratory status which can impact timing of the pharyngeal and airway closure. Pt consumed trials of thin liquids via cup/straw, purees and mech soft trials w/ no overt s/s of aspiration - frequent rest breaks given, strict aspiration precautions. Bolus management and clearing noted. Pt required feeding assistance to limit exertion and increased RR/effort. Pt is also impacted by her baseline Dementia/declined Cognitive status; HOH. Due to the increased risk for aspiration, rec. a mech soft diet w/ thin liquids; strict aspiration precautions;  meds in Puree; assistance w/ feeding at all meals. ST will f/u w/ toleration of diet/status. NSG udpated on pt's presentation. MD to f/u w/ pt's respiratory status.     Aspiration Risk  Mild    Diet Recommendation Dysphagia 3 (Mech soft);Thin   Medication Administration: Whole meds with puree Compensations: Slow rate;Small sips/bites    Other  Recommendations Recommended Consults:  (Dietician) Oral Care Recommendations: Oral care BID;Oral care before and after PO;Staff/trained caregiver to provide oral care   Follow Up Recommendations       Frequency and Duration min 3x week  1 week   Pertinent Vitals/Pain Pulled bandage off surgical site    SLP Swallow Goals     Swallow Study Prior Functional Status   [facility]    General Date of Onset: 02/15/15 Other Pertinent Information: pt presented to the hospital w/ a R femoral fx/repair s/p fall. Pt has a baseline of Dementia and COPD. She resided at a NH. Pt confused in the room pulling at her bandage on the R hip - bleeding noted so NSG informed and addressed. Pt required min.+ verbal cues to follow along w/ tasks(appropriately); [facility] Noted continued increased RR/effort w/ any exertion so frequent rest breaks given.  Type of Study: Bedside swallow evaluation Previous Swallow Assessment: none indicated Diet Prior to this Study: Regular;Thin liquids Temperature Spikes Noted: No (wbc elevated) Respiratory Status: Supplemental O2 delivered via (comment) (2 liter) History of Recent Intubation: No Behavior/Cognition: Alert;Cooperative;Distractible;Requires cueing (min. anxious) Oral Cavity - Dentition: Adequate natural dentition/normal for age Self-Feeding Abilities:  Total assist (for cues and support) Patient Positioning: Upright in bed Baseline Vocal Quality: Normal;Breathy Volitional Cough: Strong Volitional Swallow: Able to elicit    Oral/Motor/Sensory Function Overall Oral Motor/Sensory Function:  Appears within functional limits for tasks assessed Labial ROM: Within Functional Limits Labial Symmetry: Within Functional Limits Labial Strength: Within Functional Limits Lingual ROM: Within Functional Limits Lingual Symmetry: Within Functional Limits Lingual Strength: Within Functional Limits Facial Symmetry: Within Functional Limits Mandible: Within Functional Limits   Ice Chips Ice chips: Within functional limits Presentation: Spoon (fed by SLP) Other Comments:  (x3 trials)   Thin Liquid Thin Liquid: Impaired Presentation: Cup;Straw (assisted) Other Comments: min. breath holding and noted increased RR w/ the exertion    Nectar Thick Nectar Thick Liquid: Not tested   Honey Thick Honey Thick Liquid: Not tested   Puree Puree: Impaired Presentation: Spoon (assisted w/ feeding; x3 trials) Other Comments: similar results as w/ thin liquids   Solid   GO    Solid: Impaired Presentation: Spoon (assisted w/ feeding; x4 trials) Other Comments: similar results as w/ thin liquids       Holly Drake 02/20/2015,10:50 AM

## 2015-02-20 NOTE — Progress Notes (Signed)
*  PRELIMINARY RESULTS* Echocardiogram 2D Echocardiogram has been performed.  Holly Drake 02/20/2015, 8:40 AM

## 2015-02-20 NOTE — Progress Notes (Signed)
[facility] [place] [facility]   PATIENT NAME: Holly Drake    MR#:  [id number]  DATE OF BIRTH:  05/01/1924  SUBJECTIVE:  CHIEF COMPLAINT:   Chief Complaint  Patient presents with  . Fall   - Patient with dementia and Parkinson's disease, admitted after a fall and noted to have a right proximal femoral fracture. -Today patient is resting comfortably but when she is awake she is using accessory muscles to breathe. Doesn't feel good and still confused ABGs with hypoxia with alkalosis   REVIEW OF SYSTEMS:  ROS  Unable to perform review of system in view of dementia DRUG ALLERGIES:  No Known Allergies  VITALS:  Blood pressure 115/58, pulse 72, temperature 98.7 F (37.1 C), temperature source Oral, resp. rate 27, height 5' (1.524 m), weight 71.487 kg (157 lb 9.6 oz), SpO2 97 %.  PHYSICAL EXAMINATION:  Physical Exam  GENERAL:  81 y.o.-year-old patient lying in the bed with no acute distress. bair Journalist, newspaper in place. EYES: Pupils equal, round, reactive to light and accommodation. No scleral icterus. Extraocular muscles intact.  HEENT: Head atraumatic, normocephalic. Oropharynx and nasopharynx clear.  NECK:  Supple, no jugular venous distention. No thyroid enlargement, no tenderness.  LUNGS: End-expiratory wheezing present with respiratory distress, some rhonchi or present .  using accessory muscles  CARDIOVASCULAR: S1, S2 normal. 3/6 systolic murmur present, no rubs, or gallops.  ABDOMEN: Soft, nontender, nondistended. Bowel sounds present. No organomegaly or mass.  EXTREMITIES: 1+ pedal edema present, no cyanosis, or clubbing. Ice pack in place at right hip from recent surgery, dressing with some soaked blood NEUROLOGIC: Sedated slightly. Moving all extremities in bed. Following some simple commands.  PSYCHIATRIC: The patient is alert and not oriented.  SKIN: No obvious rash, lesion, or ulcer.    LABORATORY PANEL:   CBC  Recent Labs Lab  02/20/15 0430  WBC 11.9*  HGB 9.1*  HCT 28.6*  PLT 316   ------------------------------------------------------------------------------------------------------------------  Chemistries   Recent Labs Lab 02/15/15 2102  02/20/15 0430  NA 134*  < > 139  K 3.9  < > 3.1*  CL 90*  < > 90*  CO2 33*  < > 38*  GLUCOSE 147*  < > 187*  BUN 15  < > 20  CREATININE 0.71  < > 0.67  CALCIUM 9.0  < > 8.3*  AST 23  --   --   ALT 14  --   --   ALKPHOS 87  --   --   BILITOT 0.4  --   --   < > = values in this interval not displayed. ------------------------------------------------------------------------------------------------------------------  Cardiac Enzymes  Recent Labs Lab 02/15/15 2052  TROPONINI <0.03   ------------------------------------------------------------------------------------------------------------------  RADIOLOGY:  Dg Chest 2 View  02/19/2015   CLINICAL DATA:  81 year old female  EXAM: CHEST - 2 VIEW  COMPARISON:  Chest x-ray 02/15/2015, 02/18/2015, 03/01/2014. Chest CT 02/26/2013 12/31/2012  FINDINGS: Cardiomediastinal silhouette is unchanged in size and contour. Atherosclerotic calcifications of the aortic arch.  Density at the right apex persists, which is relatively unchanged over the course of several examinations, including CT studies dated 2014.  Stigmata of emphysema, with increased retrosternal airspace, flattened hemidiaphragms, increased AP diameter, and hyperinflation on the AP view.  Blunting of the costophrenic sulcus on the lateral view.  No confluent airspace disease. Coarsening of interstitial markings bilateral lungs, including reticular opacity pattern on the lateral view, corresponding to findings on comparison CTs.  Healed rib fractures of upper right  posterior ribs, present on the comparison.  No displaced fracture.  Unremarkable appearance of the upper abdomen.  IMPRESSION: No evidence of lobar pneumonia.  Interstitial opacities, which may reflect  chronic changes, chronic infection (such as MAC), bronchitis (including acute and/or chronic), sequela of aspiration/ aspiration pneumonitis.  Small pleural effusion.  Changes of emphysema.  Chronic opacity at the apex of the right lung, which is favored to be benign given the appearance over time. If there are symptoms of pleurisy or right arm pain, repeat chest CT would be recommended.  Signed,  Yvone Neu. Loreta Ave, DO  [facility]  [facility]   Electronically Signed   By: Gilmer Mor D.O.   On: 02/19/2015 16:36   Ct Angio Chest Pe W/cm &/or Wo Cm  02/20/2015   ADDENDUM REPORT: 02/20/2015 12:30  ADDENDUM: Study discussed by telephone with Dr. Ramonita Lab on 02/20/2015 at 1221 hrs.   Electronically Signed   By: Odessa Fleming M.D.   On: 02/20/2015 12:30   02/20/2015   CLINICAL DATA:  81 year old female with acute respiratory distress. Recent long bone fracture and ORIF. Initial encounter.  EXAM: CT ANGIOGRAPHY CHEST WITH CONTRAST  TECHNIQUE: Multidetector CT imaging of the chest was performed using the standard protocol during bolus administration of intravenous contrast. Multiplanar CT image reconstructions and MIPs were obtained to evaluate the vascular anatomy.  CONTRAST:  OMNIPAQUE IOHEXOL 350 MG/ML SOLN  COMPARISON:  Chest radiographs 02/19/2015 and earlier. Chest CT 02/26/2013.  FINDINGS: Good contrast bolus timing in the pulmonary arterial tree. Mild respiratory motion artifact at the lung bases. No pulmonary artery filling defect identified except in a left anterior upper lobe branch where nonocclusive filling defect is identified at a branch point (series 10, image 96).  Chronic cardiomegaly. No pericardial effusion. No pleural effusion. Chronic soft and calcified plaque of the aorta. Calcified Coronary artery plaque. No mediastinal lymphadenopathy.  Large low-density left thyroid nodule is stable since 2014. Retrosternal extension as before. Major airways are  patent. Chronic centrilobular emphysema and bilateral peribronchial thickening. Chronic confluent opacity in the right lung apex with adjacent increased extrapleural fat. This nodule measures up to 27 mm diameter but is stable (series 6, image 15). Chronic right middle lobe subpleural opacity also is stable. Interval improved bilateral lower lobe ventilation. Coarse calcified granuloma in the left lung. No acute pulmonary opacity. No pleural effusion.  Negative visualized liver, spleen, adrenal glands and kidneys. Small hiatal hernia, otherwise negative visible bowel in the upper abdomen.  Scoliosis osteopenia and degenerative changes in the spine. Chronic right anterolateral rib fractures. No acute osseous abnormality identified.  Review of the MIP images confirms the above findings.  IMPRESSION: 1. Small peripheral nonocclusive pulmonary embolus in the anterior left upper lobe. No other pulmonary artery filling defect identified. 2. Otherwise no acute findings in the chest.  Electronically Signed: By: Odessa Fleming M.D. On: 02/20/2015 12:13    EKG:   Orders placed or performed during the hospital encounter of 02/15/15  . EKG 12-Lead  . EKG 12-Lead    ASSESSMENT AND PLAN:   89y/oF with PMH of Dementia, COPD, depression admitted after a fall adn right proximal femoral fracture * Acute respiratory distress probably secondary to acute exacerbation of COP and small nonocclusive pulmonary embolus in the left upper lobe/complaint of anxiety - Patient's on IV Solu-Medrol. Provide nebulizer treatments -IV fluids are discontinued. Lasix IV -We'll continue close monitoring -We will consider BiPAP if needed -As per nurse's report patient is  not aspirating on bedside swallow evaluation. -CT chest with no pneumonia or pulmonary congestion. Discontinue IV antibiotics -Provide anxiolytics when necessary  * Nonocclusive pulmonary embolus on the left upper lobe-anticoagulant use is  controversial but patient is  already on Coumadin and INR is at 3.98 Will continue Coumadin management  * Hip Fracture- s/p ORIF, mgmt per ortho - physical therapy, pain control -  monitor closely - PT recommends skilled nursing facility   * Altered mental status -has an underlying Dementia with depression-, probably worse from sundowning,  monitor, cont home meds- on celexa  * HTN- metoprolol, norvasc and lasix changed to daily basis from every other day, losartan- monitor and hold if needed if she is hypotensive  * DVT prophylaxis- started Coumadin per pharmacy management. INR today at 3.86. Discontinued Lovenox subcutaneous Social worker consult, follow up with care management regarding placement  All the records are reviewed and case discussed with Care Management/Social Workerr. Management plans discussed with the patient's daughter and she is in agreement.  CODE STATUS: Full Code  TOTAL CRITICAL CARE TIME TAKING CARE OF THIS PATIENT: 45 minutes.   POSSIBLE D/C IN1-2  DAYS, DEPENDING ON CLINICAL CONDITION.   Ramonita LabGouru, Auriella Wieand M.D on 02/20/2015 at 3:09 PM  Between 7am to 6pm - Pager - [phone]  After 6pm go to [email or web address] - password [email or web address] [facility]  [facility]  Office  [phone]  CC: Primary care physician; No PCP Per Patient

## 2015-02-20 NOTE — Consult Note (Signed)
Date: 02/20/2015,   MRN# [id number] Holly Drake 06/13/1924 Code Status:     Code Status Orders        Start     Ordered   02/20/15 1343  Do not attempt resuscitation (DNR)   Continuous    Question Answer Comment  In the event of cardiac or respiratory ARREST Do not call a "code blue"   In the event of cardiac or respiratory ARREST Do not perform Intubation, CPR, defibrillation or ACLS   In the event of cardiac or respiratory ARREST Use medication by any route, position, wound care, and other measures to relive pain and suffering. May use oxygen, suction and manual treatment of airway obstruction as needed for comfort.      02/20/15 1343           AdmissionWeight: 148 lb 2.4 oz (67.2 kg)                 CurrentWeight: 157 lb 9.6 oz (71.487 kg)    CHIEF COMPLAINT:  SOB and increased WOB    HISTORY OF PRESENT ILLNESS   81 yo white female transferred to ICU for acute resp distress, patient with h/o COPD and PE according to daughter at bedside Patient has been initialy admitted for acute RT hip fracture s/p surgery. Patient subsequently developed acute resp distress with wheezing  Patient feels better since recieving breathing treatment, she is alert but sleepy, wheezing is minimal, oxygen at 2 L [place].        PAST MEDICAL HISTORY   Past Medical History  Diagnosis Date  . COPD (chronic obstructive pulmonary disease)   . Dementia   . OA (osteoarthritis)   . Hypertension      SURGICAL HISTORY   Past Surgical History  Procedure Laterality Date  . Breast lumpectomy    . Tonsillectomy  2014    due to abscess  . Dilation and curettage, diagnostic / therapeutic    . Femur im nail Right 02/16/2015    Procedure: INTRAMEDULLARY (IM) NAIL FEMORAL;  Surgeon: Kennedy BuckerMichael Menz, MD;  Location: [facility] ORS;  Service: Orthopedics;  Laterality: Right;     FAMILY HISTORY   Family History  Problem Relation Age of Onset  . Parkinson's disease Mother      SOCIAL HISTORY   History   Substance Use Topics  . Smoking status: Former Smoker -- 1.00 packs/day for 25 years    Types: Cigarettes  . Smokeless tobacco: Not on file  . Alcohol Use: No     MEDICATIONS    Home Medication:  No current outpatient prescriptions on file.  Current Medication:  Current facility-administered medications:  .  acetaminophen (TYLENOL) tablet 650 mg, 650 mg, Oral, Q6H PRN **OR** acetaminophen (TYLENOL) suppository 650 mg, 650 mg, Rectal, Q6H PRN, Arnaldo NatalMichael S Diamond, MD .  albuterol (PROVENTIL) (2.5 MG/3ML) 0.083% nebulizer solution 2.5 mg, 2.5 mg, Nebulization, Q4H PRN, Ramonita LabAruna Gouru, MD, 2.5 mg at 02/20/15 1218 .  alum & mag hydroxide-simeth (MAALOX/MYLANTA) 200-200-20 MG/5ML suspension 30 mL, 30 mL, Oral, Q4H PRN, Kennedy BuckerMichael Menz, MD .  amLODipine (NORVASC) tablet 10 mg, 10 mg, Oral, Daily, Arnaldo NatalMichael S Diamond, MD, 10 mg at 02/20/15 1031 .  bisacodyl (DULCOLAX) suppository 10 mg, 10 mg, Rectal, Daily PRN, Kennedy BuckerMichael Menz, MD .  calcium carbonate (TUMS - dosed in mg elemental calcium) chewable tablet 200 mg of elemental calcium, 1 tablet, Oral, Daily, Arnaldo NatalMichael S Diamond, MD, 200 mg of elemental calcium at 02/20/15 1031 .  chlorhexidine (PERIDEX) 0.12 %  solution 15 mL, 15 mL, Mouth/Throat, TID, Arnaldo Natal, MD, 15 mL at 02/19/15 2200 .  cholecalciferol (VITAMIN D) tablet 2,000 Units, 2,000 Units, Oral, Once per day on Mon Thu, Arnaldo Natal, MD, 2,000 Units at 02/19/15 [phone] .  citalopram (CELEXA) tablet 20 mg, 20 mg, Oral, QHS, Arnaldo Natal, MD, 20 mg at 02/19/15 2047 .  docusate sodium (COLACE) capsule 100 mg, 100 mg, Oral, BID, Arnaldo Natal, MD, 100 mg at 02/20/15 1031 .  furosemide (LASIX) injection 20 mg, 20 mg, Intravenous, Q12H, Aruna Gouru, MD, 20 mg at 02/20/15 1030 .  haloperidol lactate (HALDOL) injection 1 mg, 1 mg, Intravenous, Q6H PRN, Ramonita Lab, MD, 1 mg at 02/19/15 0312 .  HYDROcodone-acetaminophen (NORCO/VICODIN) 5-325 MG per tablet 1 tablet, 1 tablet, Oral, Q6H  PRN, Arnaldo Natal, MD, 1 tablet at 02/18/15 1627 .  Levofloxacin (LEVAQUIN) IVPB 250 mg, 250 mg, Intravenous, Q24H, Aruna Gouru, MD .  LORazepam (ATIVAN) tablet 0.5 mg, 0.5 mg, Oral, Q6H PRN, Ramonita Lab, MD .  losartan (COZAAR) tablet 100 mg, 100 mg, Oral, Daily, Arnaldo Natal, MD, 100 mg at 02/20/15 1030 .  magnesium hydroxide (MILK OF MAGNESIA) suspension 30 mL, 30 mL, Oral, Daily PRN, Kennedy Bucker, MD, 30 mL at 02/18/15 1557 .  menthol-cetylpyridinium (CEPACOL) lozenge 3 mg, 1 lozenge, Oral, PRN **OR** phenol (CHLORASEPTIC) mouth spray 1 spray, 1 spray, Mouth/Throat, PRN, Kennedy Bucker, MD .  methocarbamol (ROBAXIN) tablet 500 mg, 500 mg, Oral, Q6H PRN **OR** methocarbamol (ROBAXIN) 500 mg in dextrose 5 % 50 mL IVPB, 500 mg, Intravenous, Q6H PRN, Kennedy Bucker, MD .  methylPREDNISolone sodium succinate (SOLU-MEDROL) 125 mg/2 mL injection 60 mg, 60 mg, Intravenous, Q6H, Aruna Gouru, MD, 60 mg at 02/20/15 1029 .  metoCLOPramide (REGLAN) tablet 5-10 mg, 5-10 mg, Oral, Q8H PRN **OR** metoCLOPramide (REGLAN) injection 5-10 mg, 5-10 mg, Intravenous, Q8H PRN, Kennedy Bucker, MD .  metoprolol tartrate (LOPRESSOR) tablet 75 mg, 75 mg, Oral, BID, Arnaldo Natal, MD, 75 mg at 02/20/15 1030 .  mometasone-formoterol (DULERA) 100-5 MCG/ACT inhaler 2 puff, 2 puff, Inhalation, BID, Arnaldo Natal, MD, 2 puff at 02/20/15 1024 .  morphine 2 MG/ML injection 0.5 mg, 0.5 mg, Intravenous, Q2H PRN, Kennedy Bucker, MD, 0.5 mg at 02/16/15 2006 .  morphine 2 MG/ML injection 0.5 mg, 0.5 mg, Intravenous, Q2H PRN, Kennedy Bucker, MD .  ondansetron [facility]) tablet 4 mg, 4 mg, Oral, Q6H PRN **OR** ondansetron (ZOFRAN) injection 4 mg, 4 mg, Intravenous, Q6H PRN, Kennedy Bucker, MD .  pantoprazole (PROTONIX) EC tablet 40 mg, 40 mg, Oral, Daily, Arnaldo Natal, MD, 40 mg at 02/20/15 1030 .  piperacillin-tazobactam (ZOSYN) IVPB 3.375 g, 3.375 g, Intravenous, 3 times per day, Ramonita Lab, MD, 3.375 g at 02/20/15 0445 .   [START ON 02/21/2015] potassium chloride (KLOR-CON) packet 20 mEq, 20 mEq, Oral, Daily, Aruna Gouru, MD .  potassium chloride (KLOR-CON) packet 40 mEq, 40 mEq, Oral, Once, Aruna Gouru, MD .  sulfaSALAzine (AZULFIDINE) tablet 500 mg, 500 mg, Oral, BID WC, Arnaldo Natal, MD, 500 mg at 02/20/15 1030 .  Warfarin - Pharmacist Dosing Inpatient, , Does not apply, q1800, Ramonita Lab, MD    ALLERGIES   Review of patient's allergies indicates no known allergies.     REVIEW OF SYSTEMS   Review of Systems  Unable to perform ROS: acuity of condition     VS: BP 124/79 mmHg  Pulse 72  Temp(Src) 98.7 F (37.1 C) (Oral)  Resp  37  Ht 5' (1.524 m)  Wt 157 lb 9.6 oz (71.487 kg)  BMI 30.78 kg/m2  SpO2 97%     PHYSICAL EXAM  Physical Exam  Constitutional: She is oriented to person, place, and time. She appears well-developed and well-nourished. She appears distressed.  HENT:  Head: Normocephalic and atraumatic.  Mouth/Throat: No oropharyngeal exudate.  Eyes: EOM are normal. Pupils are equal, round, and reactive to light. No scleral icterus.  Neck: Normal range of motion. Neck supple.  Cardiovascular: Normal rate, regular rhythm and normal heart sounds.   No murmur heard. Pulmonary/Chest: No stridor. She is in respiratory distress. She has wheezes.  Abdominal: Soft. Bowel sounds are normal. She exhibits no distension. There is no tenderness. There is no rebound.  Musculoskeletal: Normal range of motion. She exhibits no edema.  Neurological: She is alert and oriented to person, place, and time. She displays normal reflexes. Coordination normal.  Skin: Skin is warm. She is not diaphoretic.  Psychiatric: She has a normal mood and affect.        LABS    Recent Labs     02/18/15  0330  02/19/15  0346  02/20/15  0430  HGB  8.2*  8.4*  9.1*  HCT  24.9*  25.4*  28.6*  MCV  89.8  89.2  89.5  WBC  8.4  6.8  11.9*  BUN   --   23*  20  CREATININE   --   0.61  0.67  GLUCOSE   --    206*  187*  CALCIUM   --   8.7*  8.3*  INR  2.68  3.86  3.98  ,    No results for input(s): PH in the last 72 hours.  Invalid input(s): PCO2, PO2, BASEEXCESS, BASEDEFICITE, TFT    CULTURE RESULTS   No results found for this or any previous visit (from the past 240 hour(s)).        IMAGING    Dg Chest 2 View  02/19/2015   CLINICAL DATA:  81 year old female  EXAM: CHEST - 2 VIEW  COMPARISON:  Chest x-ray 02/15/2015, 02/18/2015, 03/01/2014. Chest CT 02/26/2013 12/31/2012  FINDINGS: Cardiomediastinal silhouette is unchanged in size and contour. Atherosclerotic calcifications of the aortic arch.  Density at the right apex persists, which is relatively unchanged over the course of several examinations, including CT studies dated 2014.  Stigmata of emphysema, with increased retrosternal airspace, flattened hemidiaphragms, increased AP diameter, and hyperinflation on the AP view.  Blunting of the costophrenic sulcus on the lateral view.  No confluent airspace disease. Coarsening of interstitial markings bilateral lungs, including reticular opacity pattern on the lateral view, corresponding to findings on comparison CTs.  Healed rib fractures of upper right posterior ribs, present on the comparison.  No displaced fracture.  Unremarkable appearance of the upper abdomen.  IMPRESSION: No evidence of lobar pneumonia.  Interstitial opacities, which may reflect chronic changes, chronic infection (such as MAC), bronchitis (including acute and/or chronic), sequela of aspiration/ aspiration pneumonitis.  Small pleural effusion.  Changes of emphysema.  Chronic opacity at the apex of the right lung, which is favored to be benign given the appearance over time. If there are symptoms of pleurisy or right arm pain, repeat chest CT would be recommended.  Signed,  Yvone Neu. Loreta Ave, DO  [facility]  [facility]   Electronically Signed   By: Gilmer Mor D.O.   On:  02/19/2015 16:36   Ct Angio Chest Pe  W/cm &/or Wo Cm  02/20/2015   ADDENDUM REPORT: 02/20/2015 12:30  ADDENDUM: Study discussed by telephone with Dr. Ramonita Lab on 02/20/2015 at 1221 hrs.   Electronically Signed   By: Odessa Fleming M.D.   On: 02/20/2015 12:30   02/20/2015   CLINICAL DATA:  81 year old female with acute respiratory distress. Recent long bone fracture and ORIF. Initial encounter.  EXAM: CT ANGIOGRAPHY CHEST WITH CONTRAST  TECHNIQUE: Multidetector CT imaging of the chest was performed using the standard protocol during bolus administration of intravenous contrast. Multiplanar CT image reconstructions and MIPs were obtained to evaluate the vascular anatomy.  CONTRAST:  OMNIPAQUE IOHEXOL 350 MG/ML SOLN  COMPARISON:  Chest radiographs 02/19/2015 and earlier. Chest CT 02/26/2013.  FINDINGS: Good contrast bolus timing in the pulmonary arterial tree. Mild respiratory motion artifact at the lung bases. No pulmonary artery filling defect identified except in a left anterior upper lobe branch where nonocclusive filling defect is identified at a branch point (series 10, image 96).  Chronic cardiomegaly. No pericardial effusion. No pleural effusion. Chronic soft and calcified plaque of the aorta. Calcified Coronary artery plaque. No mediastinal lymphadenopathy.  Large low-density left thyroid nodule is stable since 2014. Retrosternal extension as before. Major airways are patent. Chronic centrilobular emphysema and bilateral peribronchial thickening. Chronic confluent opacity in the right lung apex with adjacent increased extrapleural fat. This nodule measures up to 27 mm diameter but is stable (series 6, image 15). Chronic right middle lobe subpleural opacity also is stable. Interval improved bilateral lower lobe ventilation. Coarse calcified granuloma in the left lung. No acute pulmonary opacity. No pleural effusion.  Negative visualized liver, spleen, adrenal glands and kidneys. Small hiatal hernia, otherwise  negative visible bowel in the upper abdomen.  Scoliosis osteopenia and degenerative changes in the spine. Chronic right anterolateral rib fractures. No acute osseous abnormality identified.  Review of the MIP images confirms the above findings.  IMPRESSION: 1. Small peripheral nonocclusive pulmonary embolus in the anterior left upper lobe. No other pulmonary artery filling defect identified. 2. Otherwise no acute findings in the chest.  Electronically Signed: By: Odessa Fleming M.D. On: 02/20/2015 12:13       ASSESSMENT/PLAN   81 yo white female transferred to ICU for closer monitoring for her breathing, patient has acute COPD exacerbation from inability to move air post op atelectasis and poor cough reflex and with h/o PE.   1.oxygen as needed 2.decrease IV steroids 3.start albuterol nebs/pulmicort nebs 4.start spiriva and dulera 5.biPAP as needed 6.continue coumadin  I have discussed with Daughter regarding excessive medical measures, and at this point, daughter has stated to me and ICU nurse the patient needs to be DNR/DNI.     I have personally obtained a history, examined the patient, evaluated laboratory and imaging results, formulated the assessment and plan and placed orders.  The Patient requires high complexity decision making for assessment and support, frequent evaluation and titration of therapies, application of advanced monitoring technologies and extensive interpretation of multiple databases. Time spent with patient 50 minutes.    Lucie Leather, M.D. Pulmonary & Critical Care Medicine [facility] Medical Director Intensive Care Unit

## 2015-02-21 LAB — PHOSPHORUS: PHOSPHORUS: 1.9 mg/dL — AB (ref 2.5–4.6)

## 2015-02-21 LAB — PROTIME-INR
INR: 2.36
Prothrombin Time: 25.9 seconds — ABNORMAL HIGH (ref 11.4–15.0)

## 2015-02-21 LAB — BASIC METABOLIC PANEL
ANION GAP: 10 (ref 5–15)
BUN: 18 mg/dL (ref 6–20)
CALCIUM: 8.6 mg/dL — AB (ref 8.9–10.3)
CHLORIDE: 89 mmol/L — AB (ref 101–111)
CO2: 40 mmol/L — AB (ref 22–32)
CREATININE: 0.54 mg/dL (ref 0.44–1.00)
GFR calc Af Amer: >60 mL/min (ref >60)
GLUCOSE: 152 mg/dL — AB (ref 65–99)
Potassium: 3.6 mmol/L (ref 3.5–5.1)
Sodium: 139 mmol/L (ref 135–145)

## 2015-02-21 LAB — MAGNESIUM: MAGNESIUM: 2.1 mg/dL (ref 1.7–2.4)

## 2015-02-21 MED ORDER — METHOCARBAMOL 500 MG PO TABS: 500.0000 mg | ORAL_TABLET | Freq: Four times a day (QID) | ORAL | Status: DC | PRN

## 2015-02-21 MED ORDER — DOCUSATE SODIUM 100 MG PO CAPS: 100.0000 mg | ORAL_CAPSULE | Freq: Two times a day (BID) | ORAL | Status: AC

## 2015-02-21 MED ORDER — MENTHOL 3 MG MT LOZG: 1.0000 | LOZENGE | OROMUCOSAL | Status: DC | PRN

## 2015-02-21 MED ORDER — ACETAMINOPHEN 325 MG PO TABS: 650.0000 mg | ORAL_TABLET | Freq: Four times a day (QID) | ORAL | Status: AC | PRN

## 2015-02-21 MED ORDER — BUDESONIDE 0.5 MG/2ML IN SUSP: 0.5000 mg | Freq: Two times a day (BID) | RESPIRATORY_TRACT | Status: AC

## 2015-02-21 MED ORDER — MOMETASONE FURO-FORMOTEROL FUM 200-5 MCG/ACT IN AERO: 2.0000 | INHALATION_SPRAY | Freq: Two times a day (BID) | RESPIRATORY_TRACT | Status: AC

## 2015-02-21 MED ORDER — POTASSIUM PHOSPHATES 15 MMOLE/5ML IV SOLN
20.0000 mmol | Freq: Once | INTRAVENOUS | Status: AC
  Administered 2015-02-21: 20 mmol via INTRAVENOUS
  Filled 2015-02-21: qty 6.67

## 2015-02-21 MED ORDER — LORAZEPAM 0.5 MG PO TABS: 0.5000 mg | ORAL_TABLET | Freq: Four times a day (QID) | ORAL | Status: AC | PRN

## 2015-02-21 MED ORDER — MAGNESIUM HYDROXIDE 400 MG/5ML PO SUSP: 30.0000 mL | Freq: Every day | ORAL | Status: AC | PRN

## 2015-02-21 MED ORDER — POTASSIUM & SODIUM PHOSPHATES 280-160-250 MG PO PACK: 1.0000 | PACK | Freq: Three times a day (TID) | ORAL | Status: AC

## 2015-02-21 MED ORDER — PREDNISONE 20 MG PO TABS: 20.0000 mg | ORAL_TABLET | Freq: Every day | ORAL | Status: AC

## 2015-02-21 MED ORDER — WARFARIN SODIUM 2 MG PO TABS: 2.0000 mg | ORAL_TABLET | Freq: Every day | ORAL | Status: DC

## 2015-02-21 MED ORDER — HYDROCODONE-ACETAMINOPHEN 5-325 MG PO TABS: 1.0000 | ORAL_TABLET | Freq: Four times a day (QID) | ORAL | Status: DC | PRN

## 2015-02-21 MED ORDER — TIOTROPIUM BROMIDE MONOHYDRATE 18 MCG IN CAPS: 18.0000 ug | ORAL_CAPSULE | Freq: Every day | RESPIRATORY_TRACT | Status: AC

## 2015-02-21 MED ORDER — ONDANSETRON HCL 4 MG PO TABS: 4.0000 mg | ORAL_TABLET | Freq: Four times a day (QID) | ORAL | Status: AC | PRN

## 2015-02-21 MED ORDER — WARFARIN SODIUM 1 MG PO TABS: 2.5000 mg | ORAL_TABLET | Freq: Every day | ORAL | Status: DC

## 2015-02-21 NOTE — Discharge Summary (Signed)
[facility] - [place] at [facility]   PATIENT NAME: Holly Drake    MR#:  [id number]  DATE OF BIRTH:  [birth year]  DATE OF ADMISSION:  02/15/2015 ADMITTING PHYSICIAN: Arnaldo Natal, MD  DATE OF DISCHARGE:  02/21/2015 PRIMARY CARE PHYSICIAN: No PCP Per Patient    ADMISSION DIAGNOSIS:  Fall [W19.XXXA] Subtrochanteric fracture of femur, right, closed, initial encounter [S72.21XA]  DISCHARGE DIAGNOSIS:  Principal Problem:   Hip fracture requiring operative repair Active Problems: Acute respiratory distress Acute COPD exacerbation New small nonocclusive pulmonary embolism-patient is on Coumadin and INR is therapeutic at 2.36    Dementia    SECONDARY DIAGNOSIS:   Past Medical History  Diagnosis Date  . COPD (chronic obstructive pulmonary disease)   . Dementia   . OA (osteoarthritis)   . Hypertension     HOSPITAL COURSE:  Brief history and physical  Please review history and physical for complete details. Patient came into the ED with a chief complaint of hip pain. X-ray of the right hip has revealed comminuted fracture involving the lesser and greater trochanter. Patient was admitted to the hospital and orthopedics was consulted.  Hospital course  * Acute respiratory distress probably secondary to acute exacerbation of COP and small nonocclusive pulmonary embolus in the left upper lobe/ anxiety -Clinically her situation improved with the IV Solu-Medrol. That was tapered to by mouth prednisone. Nebulizer treatments were continued. -IV fluids are discontinued. Lasix IV was given -As per nurse's report patient is not aspirating on bedside swallow evaluation. -CT chest with no pneumonia or pulmonary congestion. Discontinued IV antibiotics which were started empirically, -Provide anxiolytics when necessary, as patient has history of anxiety.  * Nonocclusive pulmonary embolus on the left upper lobe-anticoagulant use is controversial but patient is  already on Coumadin and INR is therapeutic at 2.36. Will continue Coumadin management.  primary care physician at the facility needs to consider repeating PT/INR on June 10 This patient has non-occlusive tiny pulmonary embolus in the left upper lobe and her INR being therapeutic there is no contraindication to continue physical therapy as tolerated at this point of time, discussed with pulmonology and agreeable with the current plan  * Hip Fracture- s/p ORIF, mgmt per ortho - physical therapy, pain control - monitor closely - PT recommends skilled nursing facility   * Altered mental status -has an underlying Dementia with depression-,improved.  cont home meds- on celexa  * HTN- metoprolol, norvasc and lasix changed to daily basis from every other day, losartan- monitor and hold if needed if she is hypotensive  * DVT prophylaxis- started Coumadin per pharmacy management. INR today is therapeutic  Discontinued Lovenox subcutaneous   Patient being demented, the diagnosis and plan of care was discussed in detail with the patient's daughter over 4. She is agreeable with the current plan /treatment    DISCHARGE CONDITIONS:  Satisfactory  CONSULTS OBTAINED:  Treatment Team:  Kennedy Bucker, MD  Dr. Belia Heman PROCEDURES hip surgery  DRUG ALLERGIES:  No Known Allergies  DISCHARGE MEDICATIONS:   Current Discharge Medication List    START taking these medications   Details  acetaminophen (TYLENOL) 325 MG tablet Take 2 tablets (650 mg total) by mouth every 6 (six) hours as needed for mild pain (or Fever >/= 101).    budesonide (PULMICORT) 0.5 MG/2ML nebulizer solution Take 2 mLs (0.5 mg total) by nebulization 2 (two) times daily. Qty: 50 mL, Refills: 0    docusate sodium (COLACE) 100 MG capsule Take 1 capsule (  100 mg total) by mouth 2 (two) times daily. Qty: 10 capsule, Refills: 0    LORazepam (ATIVAN) 0.5 MG tablet Take 1 tablet (0.5 mg total) by mouth every 6 (six) hours as needed for  anxiety. Qty: 20 tablet, Refills: 0    magnesium hydroxide (MILK OF MAGNESIA) 400 MG/5ML suspension Take 30 mLs by mouth daily as needed for mild constipation. Qty: 360 mL, Refills: 0    menthol-cetylpyridinium (CEPACOL) 3 MG lozenge Take 1 lozenge (3 mg total) by mouth as needed for sore throat (sore throat). Qty: 100 tablet, Refills: 12    methocarbamol (ROBAXIN) 500 MG tablet Take 1 tablet (500 mg total) by mouth every 6 (six) hours as needed for muscle spasms. Qty: 20 tablet, Refills: 0    mometasone-formoterol (DULERA) 200-5 MCG/ACT AERO Inhale 2 puffs into the lungs 2 (two) times daily. Qty: 1 Inhaler, Refills: 0    ondansetron (ZOFRAN) 4 MG tablet Take 1 tablet (4 mg total) by mouth every 6 (six) hours as needed for nausea. Qty: 20 tablet, Refills: 0    potassium & sodium phosphates (PHOS-NAK) 280-160-250 MG PACK Take 1 packet by mouth 4 (four) times daily -  before meals and at bedtime. Qty: 15 packet, Refills: 0    predniSONE (DELTASONE) 20 MG tablet Take 1 tablet (20 mg total) by mouth daily with breakfast. Qty: 5 tablet, Refills: 0    tiotropium (SPIRIVA) 18 MCG inhalation capsule Place 1 capsule (18 mcg total) into inhaler and inhale daily. Qty: 30 capsule, Refills: 12    warfarin (COUMADIN) 2 MG tablet Take 1 tablet (2 mg total) by mouth daily at 6 PM. Qty: 2 tablet, Refills: 0      CONTINUE these medications which have CHANGED   Details  HYDROcodone-acetaminophen (NORCO/VICODIN) 5-325 MG per tablet Take 1 tablet by mouth every 6 (six) hours as needed for moderate pain. Qty: 20 tablet, Refills: 0      CONTINUE these medications which have NOT CHANGED   Details  amLODipine (NORVASC) 10 MG tablet Take 10 mg by mouth daily.    calcium carbonate (OS-CAL) 600 MG TABS tablet Take 600 mg by mouth daily.    chlorhexidine (PERIDEX) 0.12 % solution Use as directed 15 mLs in the mouth or throat 3 (three) times daily.    Cholecalciferol (VITAMIN D3) 2000 UNITS TABS Take  2 tablets by mouth 2 (two) times a week. Tuesdays and Fridays and 1 tablet on mondays, wednesdays, thursdays, saturdays, sunday    citalopram (CELEXA) 20 MG tablet Take 20 mg by mouth at bedtime.    Fluticasone-Salmeterol (ADVAIR) 250-50 MCG/DOSE AEPB Inhale 1 puff into the lungs 2 (two) times daily.    furosemide (LASIX) 20 MG tablet Take 20 mg by mouth every other day.    losartan (COZAAR) 100 MG tablet Take 100 mg by mouth daily.    metoprolol tartrate (LOPRESSOR) 25 MG tablet Take 75 mg by mouth 2 (two) times daily.    Multiple Vitamins-Minerals (PRESERVISION/LUTEIN PO) Take 1 tablet by mouth daily.    pantoprazole (PROTONIX) 40 MG tablet Take 40 mg by mouth daily.    sodium fluoride (PREVIDENT 5000 PLUS) 1.1 % CREA dental cream Place 1 application onto teeth 3 (three) times daily. After meals    sulfaSALAzine (AZULFIDINE) 500 MG tablet Take 500 mg by mouth 2 (two) times daily with a meal.      STOP taking these medications     ibuprofen (ADVIL,MOTRIN) 600 MG tablet  DISCHARGE INSTRUCTIONS:   Follow-up with primary care physician at the nursing home in 2-3 days Repeat PT/INR on June 10, further Coumadin management from June 10 onwards By the Physician at the facility Follow up with orthopedics in 2 weeks/staple removal Wound care instructions as per orthopedics  DIET:   Low-salt  DISCHARGE CONDITION:  Satisfactory  ACTIVITY:  As tolerated, as recommended by physical therapy  OXYGEN:  Home Oxygen:2 L via nasal cannula   Oxygen Delivery: yes DISCHARGE LOCATION:  SNF  If you experience worsening of your admission symptoms, develop shortness of breath, life threatening emergency, suicidal or homicidal thoughts you must seek medical attention immediately by calling 911 or calling your MD immediately  if symptoms less severe.  You Must read complete instructions/literature along with all the possible adverse reactions/side effects for all the Medicines you  take and that have been prescribed to you. Take any new Medicines after you have completely understood and accpet all the possible adverse reactions/side effects.   Please note  You were cared for by a hospitalist during your hospital stay. If you have any questions about your discharge medications or the care you received while you were in the hospital after you are discharged, you can call the unit and asked to speak with the hospitalist on call if the hospitalist that took care of you is not available. Once you are discharged, your primary care physician will handle any further medical issues. Please note that NO REFILLS for any discharge medications will be authorized once you are discharged, as it is imperative that you return to your primary care physician (or establish a relationship with a primary care physician if you do not have one) for your aftercare needs so that they can reassess your need for medications and monitor your lab values.     Today  Chief Complaint  Patient presents with  . Fall   Patient is feeling much better today. Denies any chest pain or shortness of breath  ROS:  CONSTITUTIONAL: Denies fevers, chills. Denies any fatigue, weakness.  EYES: Denies blurry vision, double vision, eye pain. EARS, NOSE, THROAT: Denies tinnitus, ear pain, hearing loss. RESPIRATORY: Denies cough, wheeze, shortness of breath.  CARDIOVASCULAR: Denies chest pain, palpitations, edema.  GASTROINTESTINAL: Denies nausea, vomiting, diarrhea, abdominal pain. Denies bright red blood per rectum. GENITOURINARY: Denies dysuria, hematuria. ENDOCRINE: Denies nocturia or thyroid problems. HEMATOLOGIC AND LYMPHATIC: Denies easy bruising or bleeding. SKIN: Denies rash or lesion. MUSCULOSKELETAL: Denies pain in neck, back, shoulder, knees, hips or arthritic symptoms.  NEUROLOGIC: Denies paralysis, paresthesias.  PSYCHIATRIC: Denies anxiety or depressive symptoms.   VITAL SIGNS:  Blood pressure  102/50, pulse 83, temperature 98 F (36.7 C), temperature source Oral, resp. rate 23, height 5' (1.524 m), weight 71.487 kg (157 lb 9.6 oz), SpO2 94 %.  I/O:   Intake/Output Summary (Last 24 hours) at 02/21/15 1154 Last data filed at 02/20/15 2013  Gross per 24 hour  Intake    290 ml  Output    350 ml  Net    -60 ml    PHYSICAL EXAMINATION:  GENERAL:  81 y.o.-year-old patient lying in the bed with no acute distress.  EYES: Pupils equal, round, reactive to light and accommodation. No scleral icterus. Extraocular muscles intact.  HEENT: Head atraumatic, normocephalic. Oropharynx and nasopharynx clear.  NECK:  Supple, no jugular venous distention. No thyroid enlargement, no tenderness.  LUNGS: Normal breath sounds bilaterally, no wheezing, rales,rhonchi or crepitation. No use of accessory muscles of respiration.  CARDIOVASCULAR: S1, S2 normal. No murmurs, rubs, or gallops.  ABDOMEN: Soft, non-tender, non-distended. Bowel sounds present. No organomegaly or mass.  EXTREMITIES: No pedal edema, cyanosis, or clubbing. Incision site covered with clean dressing NEUROLOGIC:  Sensation intact. Gait not checked.  PSYCHIATRIC: The patient is alert and oriented one to 2 SKIN: No obvious rash, lesion, or ulcer.   DATA REVIEW:   CBC  Recent Labs Lab 02/20/15 0430  WBC 11.9*  HGB 9.1*  HCT 28.6*  PLT 316    Chemistries   Recent Labs Lab 02/15/15 2102  02/21/15 0511  NA 134*  < > 139  K 3.9  < > 3.6  CL 90*  < > 89*  CO2 33*  < > 40*  GLUCOSE 147*  < > 152*  BUN 15  < > 18  CREATININE 0.71  < > 0.54  CALCIUM 9.0  < > 8.6*  MG  --   < > 2.1  AST 23  --   --   ALT 14  --   --   ALKPHOS 87  --   --   BILITOT 0.4  --   --   < > = values in this interval not displayed.  Cardiac Enzymes  Recent Labs Lab 02/15/15 2052  TROPONINI <0.03    Microbiology Results  Results for orders placed or performed in visit on 03/01/14  Culture, blood (single)     Status: None   Collection  Time: 03/01/14  3:13 AM  Result Value Ref Range Status   Micro Text Report   Final       COMMENT                   NO GROWTH AEROBICALLY/ANAEROBICALLY IN 5 DAYS   ANTIBIOTIC                                                      Culture, blood (single)     Status: None   Collection Time: 03/01/14  4:03 AM  Result Value Ref Range Status   Micro Text Report   Final       COMMENT                   NO GROWTH AEROBICALLY/ANAEROBICALLY IN 5 DAYS   ANTIBIOTIC                                                        RADIOLOGY:  Dg Chest 1 View  02/18/2015   CLINICAL DATA:  Increase short of breath. COPD. Recent femur fixation  EXAM: CHEST  1 VIEW  COMPARISON:  02/15/2015  FINDINGS: The cardiac silhouette is normal volume. There is a smooth round contour to the right hilum which is more prominent than radiograph several days prior and is felt represent benign vascular contour.  There is a new left pleural effusion. No overt pulmonary edema. No pneumothorax. Chronic rib fractures noted along the right lateral chest wall  IMPRESSION: 1. New left effusion. 2. Prominent right hilum is felt to represent a benign vascular shadow.   Electronically Signed   By: Genevive Bi M.D.   On: 02/18/2015  12:20   Dg Chest 2 View  02/19/2015   CLINICAL DATA:  81 year old female  EXAM: CHEST - 2 VIEW  COMPARISON:  Chest x-ray 02/15/2015, 02/18/2015, 03/01/2014. Chest CT 02/26/2013 12/31/2012  FINDINGS: Cardiomediastinal silhouette is unchanged in size and contour. Atherosclerotic calcifications of the aortic arch.  Density at the right apex persists, which is relatively unchanged over the course of several examinations, including CT studies dated 2014.  Stigmata of emphysema, with increased retrosternal airspace, flattened hemidiaphragms, increased AP diameter, and hyperinflation on the AP view.  Blunting of the costophrenic sulcus on the lateral view.  No confluent airspace disease. Coarsening of interstitial markings  bilateral lungs, including reticular opacity pattern on the lateral view, corresponding to findings on comparison CTs.  Healed rib fractures of upper right posterior ribs, present on the comparison.  No displaced fracture.  Unremarkable appearance of the upper abdomen.  IMPRESSION: No evidence of lobar pneumonia.  Interstitial opacities, which may reflect chronic changes, chronic infection (such as MAC), bronchitis (including acute and/or chronic), sequela of aspiration/ aspiration pneumonitis.  Small pleural effusion.  Changes of emphysema.  Chronic opacity at the apex of the right lung, which is favored to be benign given the appearance over time. If there are symptoms of pleurisy or right arm pain, repeat chest CT would be recommended.  Signed,  Yvone Neu. Loreta Ave, DO  [facility]  [facility]   Electronically Signed   By: Gilmer Mor D.O.   On: 02/19/2015 16:36   Ct Angio Chest Pe W/cm &/or Wo Cm  02/20/2015   ADDENDUM REPORT: 02/20/2015 12:30  ADDENDUM: Study discussed by telephone with Dr. Ramonita Lab on 02/20/2015 at 1221 hrs.   Electronically Signed   By: Odessa Fleming M.D.   On: 02/20/2015 12:30   02/20/2015   CLINICAL DATA:  81 year old female with acute respiratory distress. Recent long bone fracture and ORIF. Initial encounter.  EXAM: CT ANGIOGRAPHY CHEST WITH CONTRAST  TECHNIQUE: Multidetector CT imaging of the chest was performed using the standard protocol during bolus administration of intravenous contrast. Multiplanar CT image reconstructions and MIPs were obtained to evaluate the vascular anatomy.  CONTRAST:  OMNIPAQUE IOHEXOL 350 MG/ML SOLN  COMPARISON:  Chest radiographs 02/19/2015 and earlier. Chest CT 02/26/2013.  FINDINGS: Good contrast bolus timing in the pulmonary arterial tree. Mild respiratory motion artifact at the lung bases. No pulmonary artery filling defect identified except in a left anterior upper lobe branch where nonocclusive filling  defect is identified at a branch point (series 10, image 96).  Chronic cardiomegaly. No pericardial effusion. No pleural effusion. Chronic soft and calcified plaque of the aorta. Calcified Coronary artery plaque. No mediastinal lymphadenopathy.  Large low-density left thyroid nodule is stable since 2014. Retrosternal extension as before. Major airways are patent. Chronic centrilobular emphysema and bilateral peribronchial thickening. Chronic confluent opacity in the right lung apex with adjacent increased extrapleural fat. This nodule measures up to 27 mm diameter but is stable (series 6, image 15). Chronic right middle lobe subpleural opacity also is stable. Interval improved bilateral lower lobe ventilation. Coarse calcified granuloma in the left lung. No acute pulmonary opacity. No pleural effusion.  Negative visualized liver, spleen, adrenal glands and kidneys. Small hiatal hernia, otherwise negative visible bowel in the upper abdomen.  Scoliosis osteopenia and degenerative changes in the spine. Chronic right anterolateral rib fractures. No acute osseous abnormality identified.  Review of the MIP images confirms the above findings.  IMPRESSION: 1. Small peripheral nonocclusive pulmonary embolus  in the anterior left upper lobe. No other pulmonary artery filling defect identified. 2. Otherwise no acute findings in the chest.  Electronically Signed: By: Odessa FlemingH  Hall M.D. On: 02/20/2015 12:13    EKG:   Orders placed or performed during the hospital encounter of 02/15/15  . EKG 12-Lead  . EKG 12-Lead  . EKG 12-Lead  . EKG 12-Lead      Management plans discussed with the patient, family and they are in agreement.  CODE STATUS:     Code Status Orders        Start     Ordered   02/20/15 1412  Full code   Continuous     02/20/15 1411      TOTAL TIME TAKING CARE OF THIS PATIENT: 45 minutes.    @[facility] @  on 02/21/2015 at 11:54 AM  Between 7am to 6pm - Pager - [phone]  After 6pm go to  [email or web address] - password [email or web address] [facility]  [place] [place] [facility]  Office  [phone]  CC: Primary care physician; No PCP Per Patient

## 2015-02-21 NOTE — Care Management Note (Signed)
Case Management Note  Patient Details  Name: Holly Drake MRN: [id number] Date of Birth: 03/07/1924  Subjective/Objective:     [facility]. CSW aware.                Action/Plan:   Expected Discharge Date:                  Expected Discharge Plan:     In-House Referral:  Clinical Social Work  Discharge planning Services  CM Consult  Post Acute Care Choice:    Choice offered to:  Adult Children  DME Arranged:    DME Agency:     HH Arranged:    HH Agency:     Status of Service:     Medicare Important Message Given:  Yes Date Medicare IM Given:  02/16/15 Medicare IM give by:  Collie SiadAngela Johnson Date Additional Medicare IM Given:  02/19/15 Additional Medicare Important Message give by:  Collie SiadAngela Johnson  If discussed at [employer] Length of Stay Meetings, dates discussed:    Additional Comments:  Holly MemosLisa M [name], RN 02/21/2015, 10:27 AM

## 2015-02-21 NOTE — Clinical Social Work Note (Signed)
Orders [facility] to [facility] and packet completed. Patient to transport via EMS due to hip precautions and dementia.  York SpanielMonica Kea Callan MSW,LCSWA [phone]

## 2015-02-21 NOTE — Clinical Social Work Placement (Signed)
   CLINICAL SOCIAL WORK PLACEMENT  NOTE  Date:  02/21/2015  Patient Details  Name: Holly Drake MRN: [id number] Date of Birth: 12/30/1923  Clinical Social Work is seeking post-discharge placement for this patient at the Skilled  Nursing Facility level of care (*CSW will initial, date and re-position this form in  chart as items are completed):  Yes   Patient/family provided with [place] Clinical Social Work Department's list of facilities offering this level of care within the geographic area requested by the patient (or if unable, by the patient's family).  Yes   Patient/family informed of their freedom to choose among providers that offer the needed level of care, that participate in Medicare, Medicaid or managed care program needed by the patient, have an available bed and are willing to accept the patient.  Yes   Patient/family informed of [facility]'s ownership interest in [facility] and [facility], as well as of the fact that they are under no obligation to receive care at these facilities.  PASRR submitted to EDS on 02/16/15     PASRR number received on 02/16/15     Existing PASRR number confirmed on       FL2 transmitted to all facilities in geographic area requested by pt/family on 02/16/15     FL2 transmitted to all facilities within larger geographic area on       Patient informed that his/her managed care company has contracts with or will negotiate with certain facilities, including the following:            Patient/family informed of bed offers received.  Patient chooses bed at     [facility]  Physician recommends and patient chooses bed at   SNF   Patient to be transferred to   on  [facility] on 02/21/15  Patient to be transferred to facility by  EMS     Patient family notified on   of transfer. 02/21/2015  Name of family member notified:     Daughter by Ortho CSW   PHYSICIAN Please sign FL2     Additional Comment:     _______________________________________________ York SpanielMonica Aisling Emigh, LCSW 02/21/2015, 2:01 PM

## 2015-02-21 NOTE — Progress Notes (Signed)
  Subjective: 5 Days Post-Op Procedure(s) (LRB): INTRAMEDULLARY (IM) NAIL FEMORAL (Right) Patient reports pain as 0/10 Patient denies any SOB. Resting comfortably We will continue with PT today Plan is to go to Skilled nursing facility after hospital stay.   Objective: Vital signs in last 24 hours: Temp:  [98 F (36.7 C)-98.7 F (37.1 C)] 98 F (36.7 C) (06/08 0700) Pulse Rate:  [67-146] 77 (06/08 0700) Resp:  [18-37] 18 (06/08 0700) BP: (95-132)/(47-85) 130/56 mmHg (06/08 0700) SpO2:  [88 %-99 %] 99 % (06/08 0729)  Intake/Output from previous day: 06/07 0701 - 06/08 0700 In: 290 [P.O.:240; IV Piggyback:50] Out: 350 [Urine:350] Intake/Output this shift:     Recent Labs  02/19/15 0346 02/20/15 0430  HGB 8.4* 9.1*    Recent Labs  02/19/15 0346 02/20/15 0430  WBC 6.8 11.9*  RBC 2.84* 3.20*  HCT 25.4* 28.6*  PLT 195 316    Recent Labs  02/20/15 0430 02/20/15 1807 02/21/15 0511  NA 139  --  139  K 3.1* 3.2* 3.6  CL 90*  --  89*  CO2 38*  --  40*  BUN 20  --  18  CREATININE 0.67  --  0.54  GLUCOSE 187*  --  152*  CALCIUM 8.3*  --  8.6*    Recent Labs  02/20/15 0430 02/21/15 0511  INR 3.98 2.36    EXAM General - Patient is  Alert and oriented Extremity - Neurologically intact Neurovascular intact Sensation intact distally Dorsiflexion/Plantar flexion intact No cellulitis present Dressing - dressing C/D/I,  Mild scant drainage Motor Function - intact, moving foot and toes well on exam.   Past Medical History  Diagnosis Date  . COPD (chronic obstructive pulmonary disease)   . Dementia   . OA (osteoarthritis)   . Hypertension     Assessment/Plan:   5 Days Post-Op Procedure(s) (LRB): INTRAMEDULLARY (IM) NAIL FEMORAL (Right) Principal Problem:   Hip fracture requiring operative repair Active Problems:   Dementia  Estimated body mass index is 30.78 kg/(m^2) as calculated from the following:   Height as of this encounter: 5' (1.524 m).   Weight as of this encounter: 71.487 kg (157 lb 9.6 oz). Continue with physical therapy dressing change p.r.n. Follow up with [facility] in 2 weeks   DVT Prophylaxis - Coumadin Weight-Bearing as tolerated to right leg D/C O2 and Pulse OX and try on Room Air  T. Cranston Neighborhris Gaines, PA-C [facility] 02/21/2015, 8:39 AM

## 2015-02-21 NOTE — Progress Notes (Addendum)
Pt alert and oriented to place and name. Mood pleasant and cooperative. VSS. [facility] at 1515 by EMS. Prescriptions sent with pt . Her daughter is aware and will [facility]. Both IV 's removed. Report given to Nurse, Amil AmenJulia.

## 2015-02-21 NOTE — Progress Notes (Signed)
PT Cancellation Note  Patient Details Name: Holly Drake MRN: [id number] DOB: 12/10/1923   Cancelled Treatment:    Reason Eval/Treat Not Completed: Medical issues which prohibited therapy (See PT cancellation note for further details. ) Per verbal conversation with RN, pt now with acute PE. RN will follow-up with MD on further mobility status. Will hold treatment at this time until medically cleared.    [name] 02/21/2015, 11:24 AM  Elizabeth PalauStephanie Deleon Passe, PT, DPT [phone]

## 2015-02-21 NOTE — Progress Notes (Signed)
Clinical Social Worker (CSW) contacted patient's daughter Rosey Batheresa and made her aware that patient is medically stable for D/C to [facility] today. Daughter reported that she is coming up to the hospital in a little while with patient's clothes. Daughter is going to complete admissions paper work at [facility] this afternoon. Daughter will order patient's lunch here before she leaves. ICU CSW will complete D/C packet. Please reconsult if future social work needs arise. CSW signing off.   Jetta LoutBailey Morgan, LCSWA [phone]

## 2015-02-22 ENCOUNTER — Other Ambulatory Visit: Payer: Self-pay

## 2015-02-22 ENCOUNTER — Encounter: Payer: Self-pay | Admitting: *Deleted

## 2015-02-22 ENCOUNTER — Emergency Department: Payer: Medicare Other

## 2015-02-22 ENCOUNTER — Emergency Department
Admission: EM | Admit: 2015-02-22 | Discharge: 2015-02-22 | Disposition: A | Discharge status: Home / Self Care | Payer: Medicare Other | Attending: Emergency Medicine | Admitting: Emergency Medicine

## 2015-02-22 DIAGNOSIS — R0689 Other abnormalities of breathing: Secondary | ICD-10-CM

## 2015-02-22 DIAGNOSIS — F0390 Unspecified dementia without behavioral disturbance: Secondary | ICD-10-CM | POA: Insufficient documentation

## 2015-02-22 DIAGNOSIS — Z87891 Personal history of nicotine dependence: Secondary | ICD-10-CM | POA: Diagnosis not present

## 2015-02-22 DIAGNOSIS — Z7951 Long term (current) use of inhaled steroids: Secondary | ICD-10-CM | POA: Insufficient documentation

## 2015-02-22 DIAGNOSIS — Z79899 Other long term (current) drug therapy: Secondary | ICD-10-CM | POA: Insufficient documentation

## 2015-02-22 DIAGNOSIS — I10 Essential (primary) hypertension: Secondary | ICD-10-CM | POA: Diagnosis not present

## 2015-02-22 DIAGNOSIS — R7981 Abnormal blood-gas level: Secondary | ICD-10-CM | POA: Diagnosis not present

## 2015-02-22 DIAGNOSIS — R0902 Hypoxemia: Secondary | ICD-10-CM | POA: Diagnosis not present

## 2015-02-22 DIAGNOSIS — J439 Emphysema, unspecified: Secondary | ICD-10-CM

## 2015-02-22 DIAGNOSIS — J441 Chronic obstructive pulmonary disease with (acute) exacerbation: Secondary | ICD-10-CM | POA: Insufficient documentation

## 2015-02-22 DIAGNOSIS — Z7952 Long term (current) use of systemic steroids: Secondary | ICD-10-CM | POA: Diagnosis not present

## 2015-02-22 DIAGNOSIS — R0602 Shortness of breath: Secondary | ICD-10-CM | POA: Diagnosis present

## 2015-02-22 LAB — CBC WITH DIFFERENTIAL/PLATELET
BASOS PCT: 0 % (ref 0–1)
BLASTS: 0 %
Band Neutrophils: 2 % (ref 0–10)
Basophils Absolute: 0 10*3/uL (ref 0.0–0.1)
EOS ABS: 0 10*3/uL (ref 0.0–0.7)
Eosinophils Relative: 0 % (ref 0–5)
HCT: 30.3 % — ABNORMAL LOW (ref 35.0–47.0)
Hemoglobin: 9.6 g/dL — ABNORMAL LOW (ref 12.0–16.0)
LYMPHS ABS: 0.3 10*3/uL — AB (ref 0.7–4.0)
Lymphocytes Relative: 3 % — ABNORMAL LOW (ref 12–46)
MCH: 28.8 pg (ref 26.0–34.0)
MCHC: 31.7 g/dL — ABNORMAL LOW (ref 32.0–36.0)
MCV: 90.7 fL (ref 80.0–100.0)
METAMYELOCYTES PCT: 3 %
Monocytes Absolute: 0.9 10*3/uL (ref 0.1–1.0)
Monocytes Relative: 8 % (ref 3–12)
Myelocytes: 0 %
NEUTROS PCT: 84 % — AB (ref 43–77)
Neutro Abs: 9.7 10*3/uL — ABNORMAL HIGH (ref 1.7–7.7)
Other: 0 %
PLATELETS: 311 10*3/uL (ref 150–440)
Promyelocytes Absolute: 0 %
RBC: 3.34 MIL/uL — ABNORMAL LOW (ref 3.80–5.20)
RDW: 13.8 % (ref 11.5–14.5)
WBC: 10.9 10*3/uL (ref 3.6–11.0)
nRBC: 1 /100 WBC — ABNORMAL HIGH

## 2015-02-22 LAB — BLOOD GAS, ARTERIAL
ALLENS TEST (PASS/FAIL): POSITIVE — AB
Acid-Base Excess: 19.2 mmol/L — ABNORMAL HIGH (ref 0.0–3.0)
BICARBONATE: 46.6 meq/L — AB (ref 21.0–28.0)
FIO2: 0.21 %
O2 Saturation: 73.4 %
PATIENT TEMPERATURE: 37
pCO2 arterial: 64 mmHg — ABNORMAL HIGH (ref 32.0–48.0)
pH, Arterial: 7.47 — ABNORMAL HIGH (ref 7.350–7.450)
pO2, Arterial: 36 mmHg — CL (ref 83.0–108.0)

## 2015-02-22 LAB — BASIC METABOLIC PANEL
ANION GAP: 10 (ref 5–15)
BUN: 24 mg/dL — ABNORMAL HIGH (ref 6–20)
CHLORIDE: 88 mmol/L — AB (ref 101–111)
CO2: 39 mmol/L — ABNORMAL HIGH (ref 22–32)
CREATININE: 0.54 mg/dL (ref 0.44–1.00)
Calcium: 8.6 mg/dL — ABNORMAL LOW (ref 8.9–10.3)
GFR calc Af Amer: >60 mL/min (ref >60)
GFR calc non Af Amer: >60 mL/min (ref >60)
Glucose, Bld: 145 mg/dL — ABNORMAL HIGH (ref 65–99)
Potassium: 4.7 mmol/L (ref 3.5–5.1)
Sodium: 137 mmol/L (ref 135–145)

## 2015-02-22 LAB — TROPONIN I: Troponin I: <0.03 ng/mL (ref <0.031)

## 2015-02-22 MED ORDER — METHYLPREDNISOLONE SODIUM SUCC 125 MG IJ SOLR
60.0000 mg | Freq: Once | INTRAMUSCULAR | Status: AC
  Administered 2015-02-22: 60 mg via INTRAVENOUS

## 2015-02-22 MED ORDER — IPRATROPIUM-ALBUTEROL 0.5-2.5 (3) MG/3ML IN SOLN
3.0000 mL | Freq: Once | RESPIRATORY_TRACT | Status: AC
  Administered 2015-02-22: 3 mL via RESPIRATORY_TRACT

## 2015-02-22 MED ORDER — METHYLPREDNISOLONE SODIUM SUCC 125 MG IJ SOLR
INTRAMUSCULAR | Status: AC
  Administered 2015-02-22: 60 mg via INTRAVENOUS
  Filled 2015-02-22: qty 2

## 2015-02-22 MED ORDER — IPRATROPIUM-ALBUTEROL 0.5-2.5 (3) MG/3ML IN SOLN
RESPIRATORY_TRACT | Status: AC
  Administered 2015-02-22: 3 mL via RESPIRATORY_TRACT
  Filled 2015-02-22: qty 3

## 2015-02-22 NOTE — ED Provider Notes (Signed)
[facility] Emergency Department Provider Note  ____________________________________________  Time seen: 1615 I have reviewed the triage vital signs and the nursing notes.  Patient with advanced dementia. The daughter provides the history.  HISTORY  Chief Complaint Shortness of Breath low O2 levels    HPI Holly Drake is a 81 y.o. female with dementia who recently had a fall with a fracture of her right hip - he was just discharged yesterday from that hospitalization. She has a history of COPD and uses oxygen around-the-clock. She is sent to the emergency department now because of reported low oxygen levels while at physical therapy.   The patient just recently had a CTA of the chest performed. It showed 1 small nonocclusive clot in one section of the lungs. The daughter reports she has a history of prior clots and does have an IVC filter in place.  The daughter is not sure if the patient uses nebulizer treatments or not. She does report that when she encourages her to close her mouth and breathe through her nose the option level goes up fairly quickly. She can tell when her mother is not using oxygen because her mother will seem a little more somnolent or loopy.    Past Medical History  Diagnosis Date  . COPD (chronic obstructive pulmonary disease)   . Dementia   . OA (osteoarthritis)   . Hypertension     Patient Active Problem List   Diagnosis Date Noted  . Hip fracture requiring operative repair 02/16/2015  . Dementia 02/16/2015    Past Surgical History  Procedure Laterality Date  . Breast lumpectomy    . Tonsillectomy  2014    due to abscess  . Dilation and curettage, diagnostic / therapeutic    . Femur im nail Right 02/16/2015    Procedure: INTRAMEDULLARY (IM) NAIL FEMORAL;  Surgeon: Kennedy Bucker, MD;  Location: [facility] ORS;  Service: Orthopedics;  Laterality: Right;  . Right hip replacement    . Ivc    . Ivc filter      Current Outpatient  Rx  Name  Route  Sig  Dispense  Refill  . acetaminophen (TYLENOL) 325 MG tablet   Oral   Take 2 tablets (650 mg total) by mouth every 6 (six) hours as needed for mild pain (or Fever >/= 101).         Marland Kitchen amLODipine (NORVASC) 10 MG tablet   Oral   Take 10 mg by mouth daily.         . budesonide (PULMICORT) 0.5 MG/2ML nebulizer solution   Nebulization   Take 2 mLs (0.5 mg total) by nebulization 2 (two) times daily.   50 mL   0   . calcium carbonate (OS-CAL) 600 MG TABS tablet   Oral   Take 600 mg by mouth daily.         . chlorhexidine (PERIDEX) 0.12 % solution   Mouth/Throat   Use as directed 15 mLs in the mouth or throat 3 (three) times daily.         . cholecalciferol (VITAMIN D) 1000 UNITS tablet   Oral   Take 1,000-2,000 Units by mouth daily. Pt takes two tablets on Tuesday and Friday.   Pt takes one tablet on Sunday, Monday, Wednesday, Thursday, and Saturday.         . citalopram (CELEXA) 20 MG tablet   Oral   Take 20 mg by mouth at bedtime.         Marland Kitchen  docusate sodium (COLACE) 100 MG capsule   Oral   Take 1 capsule (100 mg total) by mouth 2 (two) times daily.   10 capsule   0   . Fluticasone-Salmeterol (ADVAIR) 250-50 MCG/DOSE AEPB   Inhalation   Inhale 1 puff into the lungs 2 (two) times daily.         . furosemide (LASIX) 20 MG tablet   Oral   Take 20 mg by mouth every other day.         Marland Kitchen HYDROcodone-acetaminophen (NORCO/VICODIN) 5-325 MG per tablet   Oral   Take 1 tablet by mouth every 6 (six) hours as needed for moderate pain.   20 tablet   0   . LORazepam (ATIVAN) 0.5 MG tablet   Oral   Take 1 tablet (0.5 mg total) by mouth every 6 (six) hours as needed for anxiety.   20 tablet   0   . losartan (COZAAR) 100 MG tablet   Oral   Take 100 mg by mouth daily.         . magnesium hydroxide (MILK OF MAGNESIA) 400 MG/5ML suspension   Oral   Take 30 mLs by mouth daily as needed for mild constipation.   360 mL   0   .  menthol-cetylpyridinium (CEPACOL) 3 MG lozenge   Oral   Take 1 lozenge (3 mg total) by mouth as needed for sore throat (sore throat). Patient taking differently: Take 1 lozenge by mouth as needed for sore throat.    100 tablet   12   . methocarbamol (ROBAXIN) 500 MG tablet   Oral   Take 1 tablet (500 mg total) by mouth every 6 (six) hours as needed for muscle spasms.   20 tablet   0   . metoprolol tartrate (LOPRESSOR) 25 MG tablet   Oral   Take 75 mg by mouth 2 (two) times daily.         . mometasone-formoterol (DULERA) 200-5 MCG/ACT AERO   Inhalation   Inhale 2 puffs into the lungs 2 (two) times daily.   1 Inhaler   0   . Multiple Vitamins-Minerals (PRESERVISION AREDS 2) CAPS   Oral   Take 1 capsule by mouth daily.         . ondansetron (ZOFRAN) 4 MG tablet   Oral   Take 1 tablet (4 mg total) by mouth every 6 (six) hours as needed for nausea.   20 tablet   0   . pantoprazole (PROTONIX) 40 MG tablet   Oral   Take 40 mg by mouth daily.         . potassium & sodium phosphates (PHOS-NAK) 280-160-250 MG PACK   Oral   Take 1 packet by mouth 4 (four) times daily -  before meals and at bedtime.   15 packet   0   . predniSONE (DELTASONE) 20 MG tablet   Oral   Take 1 tablet (20 mg total) by mouth daily with breakfast.   5 tablet   0   . sodium fluoride (PREVIDENT 5000 PLUS) 1.1 % CREA dental cream   dental   Place 1 application onto teeth 3 (three) times daily after meals.          Marland Kitchen sulfaSALAzine (AZULFIDINE) 500 MG tablet   Oral   Take 500 mg by mouth 2 (two) times daily with a meal.         . tiotropium (SPIRIVA) 18 MCG inhalation capsule   Inhalation  Place 1 capsule (18 mcg total) into inhaler and inhale daily.   30 capsule   12   . warfarin (COUMADIN) 2 MG tablet   Oral   Take 1 tablet (2 mg total) by mouth daily at 6 PM.   2 tablet   0     Allergies Review of patient's allergies indicates no known allergies.  Family History  Problem  Relation Age of Onset  . Parkinson's disease Mother     Social History History  Substance Use Topics  . Smoking status: Former Smoker -- 1.00 packs/day for 25 years    Types: Cigarettes  . Smokeless tobacco: Not on file  . Alcohol Use: No    Review of Systems Review of systems not possible due to the patient's advanced dementia  Respiratory: reported hypoxia, see history of present illness. Musculoskeletal:recent hip fracture, see history of present illness and past medical history.  ____________________________________________   PHYSICAL EXAM:  VITAL SIGNS: ED Triage Vitals  Enc Vitals Group     BP --      Pulse --      Resp --      Temp --      Temp src --      SpO2 --      Weight --      Height --      Head Cir --      Peak Flow --      Pain Score --      Pain Loc --      Pain Edu? --      Excl. in GC? --     Constitutional: Alert, but with mild somnolence and noted confusion. ENT   Head: Normocephalic and atraumatic.   Nose: No congestion/rhinnorhea.   Mouth/Throat: Mucous membranes are moist. Cardiovascular: Normal rate, regular rhythm. Respiratory: Normal respiratory effort without tachypnea. Breath sounds are clear and equal bilaterally. No wheezes/rales/rhonchi. Gastrointestinal: Soft and nontender. No distention.  Back: No muscle spasm, no tenderness, no CVA tenderness. Musculoskeletal: patient able to move feet. The right hip has minimal tenderness. Neurologic:  Confused, calm, alert but somnolent. She is able to move her feet and hands. Grossly intact. Skin:  Skin is warm, dry. No rash noted. Psychiatric: quiet flat affect. ____________________________________________    LABS (pertinent positives/negatives)  CBC: White blood cell 10.9, hemoglobin of 9.6 Metabolic panel: Sodium 137 potassium 4.7 CO2 elevated at 39, glucose 145, BUN 24 creatinine 0.5 Troponin: Negative Arterial blood gas 7.47/64/36 patient with a O2 sat of 69% on this  room air specimen.  ____________________________________________   EKG  ED ECG REPORT I, Jovanny Stephanie W, the attending physician, personally viewed and interpreted this ECG.   Date: 02/22/2015  EKG Time: 1651  Rate: 73  Rhythm: sinus rhythm with premature supraventricular complexes  Axis: normal  Intervals: normal  ST&T Change: none noted    ____________________________________________    RADIOLOGY  Chest x-ray: No acute changes  ____________________________________________  ____________________________________________   INITIAL IMPRESSION / ASSESSMENT AND PLAN / ED COURSE  I do not think that the small nonocclusive pulmonary emboli explains the patient's hypoxia. The daughter reports that the patient has had hypoxia on and off over the past year. She does use oxygen by nasal cannula, 2 L, around-the-clock. The daughter is not particularly acquainted with the level of treatment that the patient receives for her COPD.  More likely, this hypoxia that is being noted is part of a chronic pattern for the patient, consistent with a history  the daughters providing, and is due to limited pulmonary function.   Of note, the oxygen saturation level seems to shift fairly quickly. When I turned the nasal cannula off she goes into the high 80s, when I turn it back on to 2 L she goes up quite quickly. While I think these actions correlate, there may be a problem where the pulse ox is not reading properly and under reading the patient's O2 level. Due to this and due to the fact that she may have an elevated CO2 level, I will obtain an ABG on room air.  ----------------------------------------- 5:08 PM on 02/22/2015 -----------------------------------------  Arterial blood gas shows elevated CO2 to 64 and a low O2 sat of 69%.   I am treating this respiratory failure with DuoNeb nebulizer. Will add Solu-Medrol. Chest x-ray is pending.  ----------------------------------------- 7:00  PM on 02/22/2015 -----------------------------------------  Chest x-ray shows no changes from her very recent other films. I have discussed the patient's care with Dr. Amado Coe, who knows the patient-she just discharged her yesterday. Dr. Amado Coe came and saw the patient. The patient continues at her usual state of respiratory function. This has already been evaluated by pulmonology. Dr. Amado Coe spoke with the daughter and all are in agreement that the patient may return to [facility] and continue her oxygen and nebulizers.   ____________________________________________   FINAL CLINICAL IMPRESSION(S) / ED DIAGNOSES  Final diagnoses:  Hypoxia  Pulmonary emphysema, unspecified emphysema type  Hypercarbia       Darien Ramus, MD 02/22/15 [phone]

## 2015-02-22 NOTE — Discharge Instructions (Signed)
Follow with her regular doctor. Continue with the medications that were prescribed yesterday, including nebulizer treatments.  Return to the emergency department if there appears to be more trouble breathing or other urgent concerns.  Chronic Respiratory Failure Respiratory failure is when your lungs are not working well and your breathing (respiratory) system fails. When respiratory failure occurs, it is difficult for your lungs to get enough oxygen or get rid of carbon dioxide or both. Respiratory failure can be life threatening.  Respiratory failure can be acute or chronic. Acute respiratory failure is sudden, severe, and requires emergency medical treatment. Chronic respiratory failure is less severe, happens over time, and requires ongoing treatment.  CAUSES  Any problem affecting the heart or lungs can cause respiratory failure. Some of these causes may be:  Chronic bronchitis and emphysema (COPD).  Blood clot going to the lung (pulmonary embolism).  Having water in the lungs caused by heart failure, lung injury, or infection (pulmonary edema).  Collapsed lung (pneumothorax).  Pneumonia.  Pulmonary fibrosis.  Obesity.  Asthma.  Heart failure.  Any type of trauma to the chest that can make breathing difficult.  Nerve or muscle diseases making chest movements difficult. SYMPTOMS  Signs and symptoms of chronic respiratory failure include:  Shortness of breath (dyspnea) with or without activity.  Rapid, fast breathing (tachypnea).  Wheezing.  Fast heart rate.  Bluish color to the fingernail or toenail beds.  Confusion or drowsiness or both. DIAGNOSIS  Initial diagnosis requires a thorough history and a physical exam by your health care provider. Additional tests may include:  Chest X-ray.  CT scan of your lungs.  Ultrasound to check for blood clots.  Blood tests, such as an arterial blood gas test (ABG). This is a blood test that looks at the oxygen and carbon  dioxide levels in your arterial blood.  Your vital signs will be taken. This includes your respiratory rate (how many times a minute you are breathing), oxygen saturation (this measures the oxygen level in your blood), heart rate, and blood pressure. These numbers help your health care provider determine the next steps.  Electrocardiogram. TREATMENT  Treatment of chronic respiratory failure depends on the cause of the respiratory failure. Treatment can include the following:  Oxygen. Oxygen can be delivered through the following:  Nasal cannula. This is small tubing that goes in your nose to give you oxygen.  Face mask. A face mask covers your nose and mouth to give you oxygen.  Medicine. Different medicines can be given to help with breathing. These can include:  Nebulizers. Nebulizers deliver medicines to open the air passages (bronchodilators). These medicines help to open or relax the airways in the lungs so you can breathe better. They can also help loosen mucus from your lungs.  Diuretics. Diuretic medicines can help you breathe better by getting rid of extra fluid in your body.  Steroids. Steroid medicines can help decrease inflammation in your lungs.  Chest tube. If you have a collapsed lung (pneumothorax), a chest tube is placed to help reinflate the lung.  Non-invasive positive pressure ventilation (NPPV). This is a tight-fitting mask that goes over your nose and mouth. The mask has tubing that is attached to a machine. The machine blows air into the tubing, which helps to keep the tiny air sacs (alveoli) in your lungs open. This machine allows you to breathe on your own.  Ventilator. A ventilator is a breathing machine. When on a ventilator, a breathing tube is put into the lungs.  A ventilator is used when you can no longer breathe well enough on your own. You may have low oxygen levels or high carbon dioxide (CO2) levels in your blood. When you are on a ventilator, sedation and  pain medicines are given to make you sleep so your lungs can heal. HOME CARE INSTRUCTIONS  Follow your health care provider's directions about medicines and respiratory therapy.  Quit smoking if you smoke. SEEK MEDICAL CARE IF:  You have increasing shortness of breath and are less functional than you have been.  You have increased sputum, wheezing, coughing, or loss of energy.  You are on oxygen and are requiring more. SEEK IMMEDIATE MEDICAL CARE IF:  Your shortness of breath is significantly worse.  You are unable to say more than a few words without having to catch your breath.  You are much less functional. MAKE SURE YOU:  Understand these instructions.  Will watch your condition.  Will get help right away if you are not doing well or get worse. Document Released: 09/01/2005 Document Revised: 01/16/2014 Document Reviewed: 06/30/2013 [facility] Patient Information 2015 [place], [place]. This information is not intended to replace advice given to you by your health care provider. Make sure you discuss any questions you have with your health care provider.

## 2015-02-22 NOTE — ED Notes (Signed)
Per EMS report, During physical therapy today, patient's pulse ox dropped to low 80's. Patient recently had right hip replacement surgery and also was dx with a small PE. Patient states she doesn't feel well. Patient has IVC filter.

## 2015-02-22 NOTE — Consult Note (Signed)
Reason for Consult:  Chief Complaint  Patient presents with  . Shortness of Breath   Referring Physician: Dr.Kaminski  Holly Drake is an 81 y.o. female.  HPI: Shortness of breath and low oxygen levels Holly Drake is a 81 year old Caucasian female with history of dementia was just recently admitted to the hospital with right hip fracture, was just discharged to skilled nursing care yesterday from her recent hospitalization. She was treated for COPD exacerbation, pulmonary congestion during the hospital course. Patient was on Coumadin and her INR was therapeutic at the time of discharge. The patient just recently had a CTA of the chest performed. It showed 1 small nonocclusive clot in one section of the lungs. This was discussed with pulmonologist Dr. Mortimer Fries, albuterol nonocclusive small pulmonary embolus, as patient is on Coumadin with therapeutic INR no further interventions were recommended   by pulmonology. The daughter reports she has a history of prior clots and does have an IVC filter in place.   In the ED patient's blood gas was done after withholding her oxygen for 10-15 minutes to make sure that she is clearly hypoxic as per ED physicians input. Patient was placed on 2 L of oxygen and her pulse ox was at 98- 100%. Her daughter is reporting that whenever her mom does not breathing her pulse ox is slightly trending down.  Nebulizer treatments were provided in the ED and during my entire examination patient was resting comfortably without any chest pain or shortness of breath. Chest x-ray has revealed no acute changes. Daughter thinks patient is at her baseline and willing to take her back to nursing home.  Past Medical History  Diagnosis Date  . COPD (chronic obstructive pulmonary disease)   . Dementia   . OA (osteoarthritis)   . Hypertension     Past Surgical History  Procedure Laterality Date  . Breast lumpectomy    . Tonsillectomy  2014    due to abscess  . Dilation and curettage,  diagnostic / therapeutic    . Femur im nail Right 02/16/2015    Procedure: INTRAMEDULLARY (IM) NAIL FEMORAL;  Surgeon: Hessie Knows, MD;  Location: [facility] ORS;  Service: Orthopedics;  Laterality: Right;  . Right hip replacement    . Ivc    . Ivc filter      Family History  Problem Relation Age of Onset  . Parkinson's disease Mother     Social History:  reports that she has quit smoking. Her smoking use included Cigarettes. She has a 25 pack-year smoking history. She does not have any smokeless tobacco history on file. She reports that she does not drink alcohol. Her drug history is not on file.  Allergies: No Known Allergies  Medications: I have reviewed the patient's current medications.  Results for orders placed or performed during the hospital encounter of 02/22/15 (from the past 48 hour(s))  CBC with Differential     Status: Abnormal   Collection Time: 02/22/15  4:29 PM  Result Value Ref Range   WBC 10.9 3.6 - 11.0 K/uL   RBC 3.34 (L) 3.80 - 5.20 MIL/uL   Hemoglobin 9.6 (L) 12.0 - 16.0 g/dL   HCT 30.3 (L) 35.0 - 47.0 %   MCV 90.7 80.0 - 100.0 fL   MCH 28.8 26.0 - 34.0 pg   MCHC 31.7 (L) 32.0 - 36.0 g/dL   RDW 13.8 11.5 - 14.5 %   Platelets 311 150 - 440 K/uL   Neutrophils Relative % 84 (H) 43 -  77 %   Lymphocytes Relative 3 (L) 12 - 46 %   Monocytes Relative 8 3 - 12 %   Eosinophils Relative 0 0 - 5 %   Basophils Relative 0 0 - 1 %   Band Neutrophils 2 0 - 10 %   Metamyelocytes Relative 3 %   Myelocytes 0 %   Promyelocytes Absolute 0 %   Blasts 0 %   nRBC 1 (H) 0 /100 WBC   Other 0 %   Neutro Abs 9.7 (H) 1.7 - 7.7 K/uL   Lymphs Abs 0.3 (L) 0.7 - 4.0 K/uL   Monocytes Absolute 0.9 0.1 - 1.0 K/uL   Eosinophils Absolute 0.0 0.0 - 0.7 K/uL   Basophils Absolute 0.0 0.0 - 0.1 K/uL   RBC Morphology POLYCHROMASIA PRESENT     Comment: MIXED RBC POPULATION   Smear Review LARGE PLATELETS PRESENT   Basic metabolic panel     Status: Abnormal   Collection Time: 02/22/15  4:29  PM  Result Value Ref Range   Sodium 137 135 - 145 mmol/L   Potassium 4.7 3.5 - 5.1 mmol/L   Chloride 88 (L) 101 - 111 mmol/L   CO2 39 (H) 22 - 32 mmol/L   Glucose, Bld 145 (H) 65 - 99 mg/dL   BUN 24 (H) 6 - 20 mg/dL   Creatinine, Ser 0.54 0.44 - 1.00 mg/dL   Calcium 8.6 (L) 8.9 - 10.3 mg/dL   GFR calc non Af Amer >60 >60 mL/min   GFR calc Af Amer >60 >60 mL/min    Comment: (NOTE) The eGFR has been calculated using the CKD EPI equation. This calculation has not been validated in all clinical situations. eGFR's persistently <60 mL/min signify possible Chronic Kidney Disease.    Anion gap 10 5 - 15  Troponin I     Status: None   Collection Time: 02/22/15  4:29 PM  Result Value Ref Range   Troponin I <0.03 <0.031 ng/mL    Comment:        NO INDICATION OF MYOCARDIAL INJURY.   Blood gas, arterial     Status: Abnormal   Collection Time: 02/22/15  4:35 PM  Result Value Ref Range   FIO2 0.21 %   pH, Arterial 7.47 (H) 7.350 - 7.450   pCO2 arterial 64 (H) 32.0 - 48.0 mmHg   pO2, Arterial 36 (LL) 83.0 - 108.0 mmHg    Comment: NOTIFIED PHYSICIAN  DR KAMINSKY AT 1653, 02/22/2015    Bicarbonate 46.6 (H) 21.0 - 28.0 mEq/L   Acid-Base Excess 19.2 (H) 0.0 - 3.0 mmol/L   O2 Saturation 73.4 %   Patient temperature 37.0    Collection site RIGHT RADIAL    Sample type ARTERIAL DRAW    Allens test (pass/fail) POSITIVE (A) PASS    Dg Chest Port 1 View  02/22/2015   CLINICAL DATA:  Hypoxia today, recent hip surgery  EXAM: PORTABLE CHEST - 1 VIEW  COMPARISON:  02/19/2015  FINDINGS: Stable cardiac silhouette upper normal. Stable mild diffuse interstitial prominence. Calcified granuloma left base. Calcified lymph node in the left hilum. Stable density right apex.  IMPRESSION: No change from 02/19/2015.   Electronically Signed   By: Skipper Cliche M.D.   On: 02/22/2015 17:26    ROS: Unobtainable as the patient is demented Blood pressure 122/56, pulse 74, temperature 98.6 F (37 C), temperature  source Oral, resp. rate 18, weight 74.1 kg (163 lb 5.8 oz), SpO2 97 %.   PHYSICAL EXAMINATION:  GENERAL: Well-nourished, well-developed , currently in no acute distress.  HEAD: Normocephalic, atraumatic.  EYES: Pupils equal, round, and reactive to light. Extraocular muscles intact. No scleral icterus.  MOUTH: Moist mucosal membranes. Dentition intact. No abscess noted. EARS, NOSE, THROAT: Clear without exudates. No external lesions.  NECK: Supple. No thyromegaly. No nodules. No JVD.  PULMONARY: Clear to auscultation bilaterally without wheezes, rales, or rhonchi. No use of accessory muscles. Good respiratory effort. CHEST: Nontender to palpation.  CARDIOVASCULAR: S1, S2, regular rate and rhythm. No murmurs, rubs, or gallops.  GASTROINTESTINAL: Soft, nontender, nondistended. No masses. Positive bowel sounds. No hepatosplenomegaly. MUSCULOSKELETAL: No swelling, clubbing, edema. Range of motion full in all extremities. NEUROLOGIC: Cranial nerves II-XII intact. No gross focal neurological deficits. Sensation intact. Reflexes intact. SKIN: No ulcerations, lesions, rash, cyanosis. Skin warm, dry. Turgor intact. PSYCHIATRIC: Mood, affect within normal limits. Patient awake, alert, oriented x 3. Insight and judgment intact.   Assessment/Plan:  1. Shortness of breath with hypoxia  Resolved with 2 lit of o2  Chest x-ray was no acute findings Patient lives on 2 L of oxygen, ABGs were done in the ED after withholding her oxygen for 15 minutes and blood gases revealed PO2 at 36. Resume her 2 L of home O2, pulse ox was at 98-100% Recommended to continue her home medications which were given at the time of discharge Provide nebulizer treatments Continue steroids Provide anxiety medications as needed basis  2.Newly diagnosed nonocclusive pulmonary embolus during the previous admission Patient is already on Coumadin with therapeutic INR Pulmonology has no new recommendations during her previous  hospital course Outpatient follow-up with oncology has recommended in 2-3 days  3. COPD with chronic hypoxia Continue home O2, nebulizer treatments, steroids as prescribed at the time of discharge  Patient's vital signs are stable and daughter Holly Drake is requesting to discharge the patient back to nursing home  ED RN Becky Sax was present during the entire conversation    TOTAL TIME TAKING CARE OF THIS PATIENT: 45 minutes.  @[facility] @ Pager - [phone] 02/22/2015, 8:34 PM

## 2015-02-22 NOTE — ED Notes (Signed)
Waiting for transport

## 2015-03-01 LAB — CBC WITH DIFFERENTIAL/PLATELET
Basophils Absolute: 0 10*3/uL (ref 0–0.1)
Basophils Relative: 0 %
Eosinophils Absolute: 0 10*3/uL (ref 0–0.7)
Eosinophils Relative: 0 %
HCT: 33.4 % — ABNORMAL LOW (ref 35.0–47.0)
HEMOGLOBIN: 10.6 g/dL — AB (ref 12.0–16.0)
Lymphocytes Relative: 9 %
Lymphs Abs: 1.7 10*3/uL (ref 1.0–3.6)
MCH: 29 pg (ref 26.0–34.0)
MCHC: 31.6 g/dL — ABNORMAL LOW (ref 32.0–36.0)
MCV: 91.7 fL (ref 80.0–100.0)
Monocytes Absolute: 1.8 10*3/uL — ABNORMAL HIGH (ref 0.2–0.9)
Monocytes Relative: 9 %
NEUTROS ABS: 16.4 10*3/uL — AB (ref 1.4–6.5)
NEUTROS PCT: 82 %
Platelets: 335 10*3/uL (ref 150–440)
RBC: 3.64 MIL/uL — ABNORMAL LOW (ref 3.80–5.20)
RDW: 14.5 % (ref 11.5–14.5)
WBC: 19.9 10*3/uL — ABNORMAL HIGH (ref 3.6–11.0)

## 2015-03-01 LAB — BASIC METABOLIC PANEL
ANION GAP: 13 (ref 5–15)
BUN: 19 mg/dL (ref 6–20)
CO2: 38 mmol/L — ABNORMAL HIGH (ref 22–32)
Calcium: 8.6 mg/dL — ABNORMAL LOW (ref 8.9–10.3)
Chloride: 85 mmol/L — ABNORMAL LOW (ref 101–111)
Creatinine, Ser: 0.64 mg/dL (ref 0.44–1.00)
Glucose, Bld: 103 mg/dL — ABNORMAL HIGH (ref 65–99)
Potassium: 3 mmol/L — ABNORMAL LOW (ref 3.5–5.1)
SODIUM: 136 mmol/L (ref 135–145)

## 2015-03-01 LAB — PROTIME-INR
INR: 1.82
PROTHROMBIN TIME: 21.2 s — AB (ref 11.4–15.0)

## 2015-03-02 ENCOUNTER — Encounter
Admission: RE | Admit: 2015-03-02 | Discharge: 2015-03-02 | Disposition: A | Discharge status: Home / Self Care | Payer: No Typology Code available for payment source | Source: Skilled Nursing Facility | Attending: Internal Medicine | Admitting: Internal Medicine

## 2015-03-02 DIAGNOSIS — R32 Unspecified urinary incontinence: Secondary | ICD-10-CM | POA: Insufficient documentation

## 2015-03-02 DIAGNOSIS — D72829 Elevated white blood cell count, unspecified: Secondary | ICD-10-CM | POA: Insufficient documentation

## 2015-03-02 LAB — URINALYSIS COMPLETE WITH MICROSCOPIC (ARMC ONLY)
BACTERIA UA: NONE SEEN
BILIRUBIN URINE: NEGATIVE
GLUCOSE, UA: NEGATIVE mg/dL
HGB URINE DIPSTICK: NEGATIVE
Ketones, ur: NEGATIVE mg/dL
LEUKOCYTES UA: NEGATIVE
Nitrite: NEGATIVE
Protein, ur: NEGATIVE mg/dL
RBC / HPF: NONE SEEN RBC/hpf (ref 0–5)
SPECIFIC GRAVITY, URINE: 1.013 (ref 1.005–1.030)
pH: 6 (ref 5.0–8.0)

## 2015-03-04 LAB — URINE CULTURE

## 2015-03-06 LAB — COMPREHENSIVE METABOLIC PANEL
ALK PHOS: 120 U/L (ref 38–126)
ALT: 12 U/L — ABNORMAL LOW (ref 14–54)
AST: 22 U/L (ref 15–41)
Albumin: 3.1 g/dL — ABNORMAL LOW (ref 3.5–5.0)
Anion gap: 6 (ref 5–15)
BUN: 15 mg/dL (ref 6–20)
CO2: 39 mmol/L — ABNORMAL HIGH (ref 22–32)
Calcium: 8.2 mg/dL — ABNORMAL LOW (ref 8.9–10.3)
Chloride: 88 mmol/L — ABNORMAL LOW (ref 101–111)
Creatinine, Ser: 0.58 mg/dL (ref 0.44–1.00)
GFR calc non Af Amer: >60 mL/min (ref >60)
GLUCOSE: 135 mg/dL — AB (ref 65–99)
POTASSIUM: 2.6 mmol/L — AB (ref 3.5–5.1)
Sodium: 133 mmol/L — ABNORMAL LOW (ref 135–145)
TOTAL PROTEIN: 5.2 g/dL — AB (ref 6.5–8.1)
Total Bilirubin: 0.7 mg/dL (ref 0.3–1.2)

## 2015-03-06 LAB — CBC WITH DIFFERENTIAL/PLATELET
BASOS ABS: 0.1 10*3/uL (ref 0–0.1)
BASOS PCT: 1 %
EOS PCT: 0 %
Eosinophils Absolute: 0 10*3/uL (ref 0–0.7)
HEMATOCRIT: 32.9 % — AB (ref 35.0–47.0)
HEMOGLOBIN: 10.4 g/dL — AB (ref 12.0–16.0)
Lymphocytes Relative: 13 %
Lymphs Abs: 1.2 10*3/uL (ref 1.0–3.6)
MCH: 29.3 pg (ref 26.0–34.0)
MCHC: 31.6 g/dL — AB (ref 32.0–36.0)
MCV: 92.7 fL (ref 80.0–100.0)
MONO ABS: 0.7 10*3/uL (ref 0.2–0.9)
Monocytes Relative: 8 %
Neutro Abs: 7.2 10*3/uL — ABNORMAL HIGH (ref 1.4–6.5)
Neutrophils Relative %: 78 %
Platelets: 285 10*3/uL (ref 150–440)
RBC: 3.55 MIL/uL — ABNORMAL LOW (ref 3.80–5.20)
RDW: 15.4 % — AB (ref 11.5–14.5)
WBC: 9.2 10*3/uL (ref 3.6–11.0)

## 2015-03-06 LAB — PROTIME-INR
INR: 2.39
Prothrombin Time: 26.2 seconds — ABNORMAL HIGH (ref 11.4–15.0)

## 2015-03-08 LAB — COMPREHENSIVE METABOLIC PANEL
ALT: 12 U/L — ABNORMAL LOW (ref 14–54)
ANION GAP: 7 (ref 5–15)
AST: 19 U/L (ref 15–41)
Albumin: 2.9 g/dL — ABNORMAL LOW (ref 3.5–5.0)
Alkaline Phosphatase: 118 U/L (ref 38–126)
BILIRUBIN TOTAL: 0.6 mg/dL (ref 0.3–1.2)
BUN: 20 mg/dL (ref 6–20)
CHLORIDE: 91 mmol/L — AB (ref 101–111)
CO2: 40 mmol/L — AB (ref 22–32)
Calcium: 9 mg/dL (ref 8.9–10.3)
Creatinine, Ser: 0.57 mg/dL (ref 0.44–1.00)
GFR calc non Af Amer: >60 mL/min (ref >60)
GLUCOSE: 85 mg/dL (ref 65–99)
Potassium: 4.2 mmol/L (ref 3.5–5.1)
SODIUM: 138 mmol/L (ref 135–145)
Total Protein: 4.8 g/dL — ABNORMAL LOW (ref 6.5–8.1)

## 2015-03-08 LAB — PROTIME-INR
INR: 2.33
Prothrombin Time: 25.7 seconds — ABNORMAL HIGH (ref 11.4–15.0)

## 2015-03-08 LAB — CBC WITH DIFFERENTIAL/PLATELET
BASOS PCT: 1 %
Basophils Absolute: 0 10*3/uL (ref 0–0.1)
EOS ABS: 0 10*3/uL (ref 0–0.7)
Eosinophils Relative: 1 %
HEMATOCRIT: 30.3 % — AB (ref 35.0–47.0)
HEMOGLOBIN: 9.6 g/dL — AB (ref 12.0–16.0)
Lymphocytes Relative: 16 %
Lymphs Abs: 1.3 10*3/uL (ref 1.0–3.6)
MCH: 29.4 pg (ref 26.0–34.0)
MCHC: 31.6 g/dL — ABNORMAL LOW (ref 32.0–36.0)
MCV: 93.1 fL (ref 80.0–100.0)
MONO ABS: 0.6 10*3/uL (ref 0.2–0.9)
MONOS PCT: 8 %
Neutro Abs: 5.9 10*3/uL (ref 1.4–6.5)
Neutrophils Relative %: 74 %
Platelets: 253 10*3/uL (ref 150–440)
RBC: 3.25 MIL/uL — ABNORMAL LOW (ref 3.80–5.20)
RDW: 15.3 % — ABNORMAL HIGH (ref 11.5–14.5)
WBC: 7.9 10*3/uL (ref 3.6–11.0)

## 2015-03-21 ENCOUNTER — Other Ambulatory Visit
Admission: RE | Admit: 2015-03-21 | Discharge: 2015-03-21 | Disposition: A | Discharge status: Home / Self Care | Payer: Medicare Other | Source: Ambulatory Visit | Attending: Nurse Practitioner | Admitting: Nurse Practitioner

## 2015-03-21 DIAGNOSIS — I82409 Acute embolism and thrombosis of unspecified deep veins of unspecified lower extremity: Secondary | ICD-10-CM | POA: Insufficient documentation

## 2015-03-21 LAB — CBC WITH DIFFERENTIAL/PLATELET
BASOS ABS: 0 10*3/uL (ref 0–0.1)
Basophils Relative: 1 %
Eosinophils Absolute: 0.1 10*3/uL (ref 0–0.7)
Eosinophils Relative: 1 %
HCT: 32.6 % — ABNORMAL LOW (ref 35.0–47.0)
Hemoglobin: 10.7 g/dL — ABNORMAL LOW (ref 12.0–16.0)
Lymphocytes Relative: 7 %
Lymphs Abs: 0.8 10*3/uL — ABNORMAL LOW (ref 1.0–3.6)
MCH: 30.2 pg (ref 26.0–34.0)
MCHC: 32.9 g/dL (ref 32.0–36.0)
MCV: 91.8 fL (ref 80.0–100.0)
Monocytes Absolute: 1 10*3/uL — ABNORMAL HIGH (ref 0.2–0.9)
Monocytes Relative: 9 %
NEUTROS ABS: 8.6 10*3/uL — AB (ref 1.4–6.5)
NEUTROS PCT: 82 %
PLATELETS: 253 10*3/uL (ref 150–440)
RBC: 3.55 MIL/uL — ABNORMAL LOW (ref 3.80–5.20)
RDW: 14.7 % — ABNORMAL HIGH (ref 11.5–14.5)
WBC: 10.5 10*3/uL (ref 3.6–11.0)

## 2015-03-21 LAB — COMPREHENSIVE METABOLIC PANEL
ALT: 13 U/L — ABNORMAL LOW (ref 14–54)
AST: 17 U/L (ref 15–41)
Albumin: 3.3 g/dL — ABNORMAL LOW (ref 3.5–5.0)
Alkaline Phosphatase: 168 U/L — ABNORMAL HIGH (ref 38–126)
Anion gap: 10 (ref 5–15)
BUN: 14 mg/dL (ref 6–20)
CALCIUM: 8.8 mg/dL — AB (ref 8.9–10.3)
CO2: 33 mmol/L — ABNORMAL HIGH (ref 22–32)
Chloride: 91 mmol/L — ABNORMAL LOW (ref 101–111)
Creatinine, Ser: 0.72 mg/dL (ref 0.44–1.00)
GFR calc non Af Amer: >60 mL/min (ref >60)
Glucose, Bld: 138 mg/dL — ABNORMAL HIGH (ref 65–99)
Potassium: 3.5 mmol/L (ref 3.5–5.1)
Sodium: 134 mmol/L — ABNORMAL LOW (ref 135–145)
Total Bilirubin: 0.6 mg/dL (ref 0.3–1.2)
Total Protein: 5.7 g/dL — ABNORMAL LOW (ref 6.5–8.1)

## 2015-03-21 LAB — PROTIME-INR
INR: 1.33
Prothrombin Time: 16.5 seconds — ABNORMAL HIGH (ref 11.4–15.0)

## 2015-03-27 ENCOUNTER — Inpatient Hospital Stay
Admission: EM | Admit: 2015-03-27 | Discharge: 2015-03-30 | DRG: 871 | Disposition: A | Discharge status: Home / Self Care | Payer: Medicare Other | Attending: Internal Medicine | Admitting: Internal Medicine

## 2015-03-27 ENCOUNTER — Encounter: Payer: Self-pay | Admitting: *Deleted

## 2015-03-27 ENCOUNTER — Emergency Department: Payer: Medicare Other

## 2015-03-27 DIAGNOSIS — I248 Other forms of acute ischemic heart disease: Secondary | ICD-10-CM | POA: Diagnosis present

## 2015-03-27 DIAGNOSIS — Z86711 Personal history of pulmonary embolism: Secondary | ICD-10-CM | POA: Diagnosis not present

## 2015-03-27 DIAGNOSIS — J9621 Acute and chronic respiratory failure with hypoxia: Secondary | ICD-10-CM | POA: Diagnosis present

## 2015-03-27 DIAGNOSIS — Z96641 Presence of right artificial hip joint: Secondary | ICD-10-CM | POA: Diagnosis present

## 2015-03-27 DIAGNOSIS — Y95 Nosocomial condition: Secondary | ICD-10-CM | POA: Diagnosis present

## 2015-03-27 DIAGNOSIS — J45909 Unspecified asthma, uncomplicated: Secondary | ICD-10-CM | POA: Diagnosis present

## 2015-03-27 DIAGNOSIS — Z9981 Dependence on supplemental oxygen: Secondary | ICD-10-CM

## 2015-03-27 DIAGNOSIS — J441 Chronic obstructive pulmonary disease with (acute) exacerbation: Secondary | ICD-10-CM | POA: Diagnosis present

## 2015-03-27 DIAGNOSIS — J9601 Acute respiratory failure with hypoxia: Secondary | ICD-10-CM

## 2015-03-27 DIAGNOSIS — Z66 Do not resuscitate: Secondary | ICD-10-CM | POA: Diagnosis present

## 2015-03-27 DIAGNOSIS — J189 Pneumonia, unspecified organism: Secondary | ICD-10-CM | POA: Diagnosis present

## 2015-03-27 DIAGNOSIS — N390 Urinary tract infection, site not specified: Secondary | ICD-10-CM | POA: Diagnosis present

## 2015-03-27 DIAGNOSIS — Z7951 Long term (current) use of inhaled steroids: Secondary | ICD-10-CM

## 2015-03-27 DIAGNOSIS — F0390 Unspecified dementia without behavioral disturbance: Secondary | ICD-10-CM | POA: Diagnosis present

## 2015-03-27 DIAGNOSIS — Z86718 Personal history of other venous thrombosis and embolism: Secondary | ICD-10-CM

## 2015-03-27 DIAGNOSIS — F329 Major depressive disorder, single episode, unspecified: Secondary | ICD-10-CM | POA: Diagnosis present

## 2015-03-27 DIAGNOSIS — Z7901 Long term (current) use of anticoagulants: Secondary | ICD-10-CM | POA: Diagnosis not present

## 2015-03-27 DIAGNOSIS — I10 Essential (primary) hypertension: Secondary | ICD-10-CM | POA: Diagnosis present

## 2015-03-27 DIAGNOSIS — D689 Coagulation defect, unspecified: Secondary | ICD-10-CM | POA: Diagnosis present

## 2015-03-27 DIAGNOSIS — R7989 Other specified abnormal findings of blood chemistry: Secondary | ICD-10-CM | POA: Diagnosis present

## 2015-03-27 DIAGNOSIS — A419 Sepsis, unspecified organism: Principal | ICD-10-CM | POA: Diagnosis present

## 2015-03-27 DIAGNOSIS — Z87891 Personal history of nicotine dependence: Secondary | ICD-10-CM

## 2015-03-27 DIAGNOSIS — R778 Other specified abnormalities of plasma proteins: Secondary | ICD-10-CM | POA: Diagnosis present

## 2015-03-27 LAB — URINALYSIS COMPLETE WITH MICROSCOPIC (ARMC ONLY)
BILIRUBIN URINE: NEGATIVE
Glucose, UA: NEGATIVE mg/dL
Hgb urine dipstick: NEGATIVE
Nitrite: POSITIVE — AB
PH: 5 (ref 5.0–8.0)
Protein, ur: 100 mg/dL — AB
SPECIFIC GRAVITY, URINE: 1.02 (ref 1.005–1.030)
Squamous Epithelial / LPF: NONE SEEN

## 2015-03-27 LAB — CBC WITH DIFFERENTIAL/PLATELET
BASOS ABS: 0 10*3/uL (ref 0–0.1)
BASOS PCT: 0 %
EOS ABS: 0 10*3/uL (ref 0–0.7)
Eosinophils Relative: 0 %
HCT: 31 % — ABNORMAL LOW (ref 35.0–47.0)
HEMOGLOBIN: 10 g/dL — AB (ref 12.0–16.0)
Lymphocytes Relative: 5 %
Lymphs Abs: 0.8 10*3/uL — ABNORMAL LOW (ref 1.0–3.6)
MCH: 29.4 pg (ref 26.0–34.0)
MCHC: 32.4 g/dL (ref 32.0–36.0)
MCV: 90.8 fL (ref 80.0–100.0)
MONO ABS: 1 10*3/uL — AB (ref 0.2–0.9)
MONOS PCT: 6 %
Neutro Abs: 14.2 10*3/uL — ABNORMAL HIGH (ref 1.4–6.5)
Neutrophils Relative %: 89 %
Platelets: 389 10*3/uL (ref 150–440)
RBC: 3.42 MIL/uL — AB (ref 3.80–5.20)
RDW: 14.8 % — ABNORMAL HIGH (ref 11.5–14.5)
WBC: 16 10*3/uL — ABNORMAL HIGH (ref 3.6–11.0)

## 2015-03-27 LAB — LIPASE, BLOOD: LIPASE: 52 U/L — AB (ref 22–51)

## 2015-03-27 LAB — TROPONIN I: Troponin I: 0.08 ng/mL — ABNORMAL HIGH (ref <0.031)

## 2015-03-27 LAB — COMPREHENSIVE METABOLIC PANEL
ALT: 17 U/L (ref 14–54)
ANION GAP: 14 (ref 5–15)
AST: 28 U/L (ref 15–41)
Albumin: 2.8 g/dL — ABNORMAL LOW (ref 3.5–5.0)
Alkaline Phosphatase: 147 U/L — ABNORMAL HIGH (ref 38–126)
BUN: 8 mg/dL (ref 6–20)
CHLORIDE: 91 mmol/L — AB (ref 101–111)
CO2: 31 mmol/L (ref 22–32)
Calcium: 8.7 mg/dL — ABNORMAL LOW (ref 8.9–10.3)
Creatinine, Ser: 0.51 mg/dL (ref 0.44–1.00)
GFR calc Af Amer: >60 mL/min (ref >60)
GFR calc non Af Amer: >60 mL/min (ref >60)
Glucose, Bld: 147 mg/dL — ABNORMAL HIGH (ref 65–99)
Potassium: 3.7 mmol/L (ref 3.5–5.1)
SODIUM: 136 mmol/L (ref 135–145)
Total Bilirubin: 0.8 mg/dL (ref 0.3–1.2)
Total Protein: 5.8 g/dL — ABNORMAL LOW (ref 6.5–8.1)

## 2015-03-27 LAB — LACTIC ACID, PLASMA
LACTIC ACID, VENOUS: 1.1 mmol/L (ref 0.5–2.0)
Lactic Acid, Venous: 0.8 mmol/L (ref 0.5–2.0)

## 2015-03-27 LAB — BLOOD GAS, ARTERIAL
Acid-Base Excess: 11.2 mmol/L — ABNORMAL HIGH (ref 0.0–3.0)
BICARBONATE: 37 meq/L — AB (ref 21.0–28.0)
FIO2: 1 %
O2 SAT: 99.8 %
Patient temperature: 37
pCO2 arterial: 52 mmHg — ABNORMAL HIGH (ref 32.0–48.0)
pH, Arterial: 7.46 — ABNORMAL HIGH (ref 7.350–7.450)
pO2, Arterial: 239 mmHg — ABNORMAL HIGH (ref 83.0–108.0)

## 2015-03-27 LAB — PROTIME-INR
INR: 6.67 — AB
PROTHROMBIN TIME: 57.7 s — AB (ref 11.4–15.0)

## 2015-03-27 MED ORDER — METHYLPREDNISOLONE SODIUM SUCC 125 MG IJ SOLR: 60.0000 mg | Freq: Four times a day (QID) | INTRAMUSCULAR | Status: DC

## 2015-03-27 MED ORDER — FUROSEMIDE 20 MG PO TABS
20.0000 mg | ORAL_TABLET | ORAL | Status: DC
  Administered 2015-03-27 – 2015-03-29 (×2): 20 mg via ORAL
  Filled 2015-03-27 (×2): qty 1

## 2015-03-27 MED ORDER — VANCOMYCIN HCL IN DEXTROSE 1-5 GM/200ML-% IV SOLN
1000.0000 mg | Freq: Once | INTRAVENOUS | Status: AC
  Administered 2015-03-27: 1000 mg via INTRAVENOUS
  Filled 2015-03-27: qty 200

## 2015-03-27 MED ORDER — VANCOMYCIN HCL IN DEXTROSE 1-5 GM/200ML-% IV SOLN
1000.0000 mg | INTRAVENOUS | Status: DC
  Administered 2015-03-27 – 2015-03-28 (×2): 1000 mg via INTRAVENOUS
  Filled 2015-03-27 (×3): qty 200

## 2015-03-27 MED ORDER — POTASSIUM CHLORIDE CRYS ER 20 MEQ PO TBCR
20.0000 meq | EXTENDED_RELEASE_TABLET | Freq: Two times a day (BID) | ORAL | Status: DC
  Administered 2015-03-27 – 2015-03-30 (×7): 20 meq via ORAL
  Filled 2015-03-27 (×7): qty 1

## 2015-03-27 MED ORDER — CALCIUM CARBONATE 600 MG PO TABS
600.0000 mg | ORAL_TABLET | Freq: Every day | ORAL | Status: DC
  Filled 2015-03-27: qty 1

## 2015-03-27 MED ORDER — CALCIUM CARBONATE ANTACID 500 MG PO CHEW
200.0000 mg | CHEWABLE_TABLET | Freq: Every day | ORAL | Status: DC
  Administered 2015-03-27 – 2015-03-29 (×3): 200 mg via ORAL
  Filled 2015-03-27 (×3): qty 1

## 2015-03-27 MED ORDER — ACETAMINOPHEN 325 MG PO TABS
650.0000 mg | ORAL_TABLET | Freq: Once | ORAL | Status: AC
  Administered 2015-03-27: 650 mg via ORAL
  Filled 2015-03-27: qty 2

## 2015-03-27 MED ORDER — TIOTROPIUM BROMIDE MONOHYDRATE 18 MCG IN CAPS
18.0000 ug | ORAL_CAPSULE | Freq: Every day | RESPIRATORY_TRACT | Status: DC
  Administered 2015-03-27: 18 ug via RESPIRATORY_TRACT
  Filled 2015-03-27: qty 5

## 2015-03-27 MED ORDER — ONDANSETRON HCL 4 MG/2ML IJ SOLN: 4.0000 mg | Freq: Four times a day (QID) | INTRAMUSCULAR | Status: DC | PRN

## 2015-03-27 MED ORDER — SODIUM CHLORIDE 0.9 % IJ SOLN
3.0000 mL | Freq: Two times a day (BID) | INTRAMUSCULAR | Status: DC
  Administered 2015-03-27 – 2015-03-30 (×7): 3 mL via INTRAVENOUS

## 2015-03-27 MED ORDER — ACETAMINOPHEN 325 MG PO TABS
650.0000 mg | ORAL_TABLET | Freq: Four times a day (QID) | ORAL | Status: DC | PRN
  Administered 2015-03-28: 650 mg via ORAL
  Filled 2015-03-27: qty 2

## 2015-03-27 MED ORDER — PIPERACILLIN-TAZOBACTAM 3.375 G IVPB
3.3750 g | Freq: Three times a day (TID) | INTRAVENOUS | Status: DC
  Filled 2015-03-27 (×4): qty 50

## 2015-03-27 MED ORDER — PANTOPRAZOLE SODIUM 40 MG PO TBEC
40.0000 mg | DELAYED_RELEASE_TABLET | Freq: Every day | ORAL | Status: DC
  Administered 2015-03-27 – 2015-03-30 (×4): 40 mg via ORAL
  Filled 2015-03-27 (×4): qty 1

## 2015-03-27 MED ORDER — ASPIRIN EC 81 MG PO TBEC
81.0000 mg | DELAYED_RELEASE_TABLET | Freq: Every day | ORAL | Status: DC
  Administered 2015-03-27 – 2015-03-30 (×4): 81 mg via ORAL
  Filled 2015-03-27 (×4): qty 1

## 2015-03-27 MED ORDER — LORAZEPAM 0.5 MG PO TABS: 0.5000 mg | ORAL_TABLET | Freq: Four times a day (QID) | ORAL | Status: DC | PRN

## 2015-03-27 MED ORDER — SODIUM CHLORIDE 0.9 % IV BOLUS (SEPSIS)
500.0000 mL | INTRAVENOUS | Status: AC
  Administered 2015-03-27: 500 mL via INTRAVENOUS

## 2015-03-27 MED ORDER — LOSARTAN POTASSIUM 50 MG PO TABS
100.0000 mg | ORAL_TABLET | Freq: Every day | ORAL | Status: DC
  Administered 2015-03-27 – 2015-03-30 (×4): 100 mg via ORAL
  Filled 2015-03-27 (×4): qty 2

## 2015-03-27 MED ORDER — HYDROCODONE-ACETAMINOPHEN 5-325 MG PO TABS: 1.0000 | ORAL_TABLET | Freq: Four times a day (QID) | ORAL | Status: DC | PRN

## 2015-03-27 MED ORDER — METHYLPREDNISOLONE SODIUM SUCC 125 MG IJ SOLR
125.0000 mg | INTRAMUSCULAR | Status: AC
  Administered 2015-03-27: 125 mg via INTRAVENOUS
  Filled 2015-03-27: qty 2

## 2015-03-27 MED ORDER — ONDANSETRON HCL 4 MG PO TABS: 4.0000 mg | ORAL_TABLET | Freq: Four times a day (QID) | ORAL | Status: DC | PRN

## 2015-03-27 MED ORDER — MAGNESIUM HYDROXIDE 400 MG/5ML PO SUSP: 30.0000 mL | Freq: Every day | ORAL | Status: DC | PRN

## 2015-03-27 MED ORDER — METHYLPREDNISOLONE SODIUM SUCC 125 MG IJ SOLR
60.0000 mg | Freq: Two times a day (BID) | INTRAMUSCULAR | Status: DC
  Administered 2015-03-27 – 2015-03-30 (×6): 60 mg via INTRAVENOUS
  Filled 2015-03-27 (×6): qty 2

## 2015-03-27 MED ORDER — METHOCARBAMOL 500 MG PO TABS
500.0000 mg | ORAL_TABLET | Freq: Four times a day (QID) | ORAL | Status: DC | PRN
  Filled 2015-03-27: qty 1

## 2015-03-27 MED ORDER — METOPROLOL TARTRATE 50 MG PO TABS
75.0000 mg | ORAL_TABLET | Freq: Two times a day (BID) | ORAL | Status: DC
  Administered 2015-03-27 – 2015-03-30 (×7): 75 mg via ORAL
  Filled 2015-03-27 (×7): qty 1

## 2015-03-27 MED ORDER — AMLODIPINE BESYLATE 10 MG PO TABS
10.0000 mg | ORAL_TABLET | Freq: Every day | ORAL | Status: DC
  Administered 2015-03-27 – 2015-03-30 (×4): 10 mg via ORAL
  Filled 2015-03-27 (×4): qty 1

## 2015-03-27 MED ORDER — DOCUSATE SODIUM 100 MG PO CAPS
100.0000 mg | ORAL_CAPSULE | Freq: Two times a day (BID) | ORAL | Status: DC
  Administered 2015-03-27 – 2015-03-30 (×7): 100 mg via ORAL
  Filled 2015-03-27 (×7): qty 1

## 2015-03-27 MED ORDER — IPRATROPIUM-ALBUTEROL 0.5-2.5 (3) MG/3ML IN SOLN
3.0000 mL | Freq: Once | RESPIRATORY_TRACT | Status: AC
  Administered 2015-03-27: 3 mL via RESPIRATORY_TRACT
  Filled 2015-03-27: qty 3

## 2015-03-27 MED ORDER — SODIUM CHLORIDE 0.9 % IV BOLUS (SEPSIS)
1000.0000 mL | INTRAVENOUS | Status: AC
  Administered 2015-03-27 (×2): 1000 mL via INTRAVENOUS

## 2015-03-27 MED ORDER — CITALOPRAM HYDROBROMIDE 20 MG PO TABS
20.0000 mg | ORAL_TABLET | Freq: Every day | ORAL | Status: DC
  Administered 2015-03-27 – 2015-03-29 (×3): 20 mg via ORAL
  Filled 2015-03-27 (×3): qty 1

## 2015-03-27 MED ORDER — WARFARIN SODIUM 1 MG PO TABS: 2.0000 mg | ORAL_TABLET | Freq: Every day | ORAL | Status: DC

## 2015-03-27 MED ORDER — PIPERACILLIN-TAZOBACTAM 3.375 G IVPB 30 MIN
3.3750 g | Freq: Three times a day (TID) | INTRAVENOUS | Status: DC
  Administered 2015-03-27 – 2015-03-29 (×8): 3.375 g via INTRAVENOUS
  Filled 2015-03-27 (×11): qty 50

## 2015-03-27 MED ORDER — IPRATROPIUM-ALBUTEROL 0.5-2.5 (3) MG/3ML IN SOLN
3.0000 mL | Freq: Four times a day (QID) | RESPIRATORY_TRACT | Status: DC
  Administered 2015-03-27 – 2015-03-28 (×4): 3 mL via RESPIRATORY_TRACT
  Filled 2015-03-27 (×7): qty 3

## 2015-03-27 MED ORDER — MOMETASONE FURO-FORMOTEROL FUM 100-5 MCG/ACT IN AERO
2.0000 | INHALATION_SPRAY | Freq: Two times a day (BID) | RESPIRATORY_TRACT | Status: DC
  Administered 2015-03-27 – 2015-03-30 (×7): 2 via RESPIRATORY_TRACT
  Filled 2015-03-27: qty 8.8

## 2015-03-27 NOTE — Progress Notes (Signed)
ANTIBIOTIC CONSULT NOTE - INITIAL  Pharmacy Consult for Vancomycin and Zosyn Indication: Sepsis/UTI and possible HCAP  No Known Allergies  Patient Measurements: Weight: 146 lb (66.225 kg) Adjusted Body Weight: 53.8 kg  Vital Signs: Temp: 98.9 F (37.2 C) (07/12 0619) Temp Source: Rectal (07/12 0619) BP: 118/62 mmHg (07/12 0630) Pulse Rate: 98 (07/12 0630) Intake/Output from previous day: 07/11 0701 - 07/12 0700 In: 1500 [IV Piggyback:1500] Out: -  Intake/Output from this shift: Total I/O In: 1500 [IV Piggyback:1500] Out: -   Labs:  Recent Labs  03/27/15 0348  WBC 16.0*  HGB 10.0*  PLT 389  CREATININE 0.51   Estimated Creatinine Clearance: 40.5 mL/min (by C-G formula based on Cr of 0.51). No results for input(s): VANCOTROUGH, VANCOPEAK, VANCORANDOM, GENTTROUGH, GENTPEAK, GENTRANDOM, TOBRATROUGH, TOBRAPEAK, TOBRARND, AMIKACINPEAK, AMIKACINTROU, AMIKACIN in the last 72 hours.   Microbiology: Recent Results (from the past 720 hour(s))  Urine culture     Status: None   Collection Time: 03/02/15  6:15 PM  Result Value Ref Range Status   Specimen Description URINE, CLEAN CATCH  Final   Special Requests NONE  Final   Culture   Final    MULTIPLE SPECIES PRESENT, SUGGEST RECOLLECTION IF CLINICALLY INDICATED   Report Status 03/04/2015 FINAL  Final    Medical History: Past Medical History  Diagnosis Date  . COPD (chronic obstructive pulmonary disease)   . Dementia   . OA (osteoarthritis)   . Hypertension     Medications:  Anti-infectives    Start     Dose/Rate Route Frequency Ordered Stop   03/27/15 0600  piperacillin-tazobactam (ZOSYN) IVPB 3.375 g     3.375 g 100 mL/hr over 30 Minutes Intravenous 3 times per day 03/27/15 0335     03/27/15 0345  vancomycin (VANCOCIN) IVPB 1000 mg/200 mL premix     1,000 mg 200 mL/hr over 60 Minutes Intravenous  Once 03/27/15 0335 03/27/15 0451     Assessment: Patient empirically started on Zosyn and Vancomycin, first doses  given in the ED.  Goal of Therapy:  Vancomycin trough level 15-20 mcg/ml  Plan:  Measure antibiotic drug levels at steady state Follow up culture results Will order Vancomycin 1g IV q24h, starting 12 hours after initial 1g dose for stacked dosing. Will check a trough level prior to 4th dose. Will order Zosyn 3.375g IV q8h EI.  Clovia CuffLisa Javione Gunawan, PharmD, BCPS 03/27/2015 6:53 AM

## 2015-03-27 NOTE — ED Notes (Signed)
Pt titrated down from NRB mask to 4L/min Lucas of oxygen - Dr. York CeriseForbach, EDP aware.

## 2015-03-27 NOTE — ED Notes (Signed)
Per EMS [facility] shortness of breath, pt chronically on 2L/min O2, pt found to be 88% SPO2 on 2L - pt given x2 albuterol neb tx w/ 100% O2 and SPO2 up to 96% Pt denies chest pain at present, pt hot to touch.

## 2015-03-27 NOTE — ED Notes (Signed)
In and out cath complete, patient tolerated procedure well. Urine specimen sent to lab.

## 2015-03-27 NOTE — ED Provider Notes (Signed)
[facility] Emergency Department Provider Note  ____________________________________________  Time seen: Approximately 3:35 AM  I have reviewed the triage vital signs and the nursing notes.   HISTORY  Chief Complaint Respiratory Distress  The patient has chronic dementia and is in severe respiratory distress  HPI Holly Drake is a 81 y.o. female from [facility] who arrives by EMS for shortness of breath.  The staff at the facility called them out for her difficulty breathing.  She wears 2 L of oxygen at baseline and was 88% at the facility.  She received a nebulizer treatment of albuterol 2 and was put on a nonrebreather for her hypoxemia.  While she was being transferred to the bed in the emergency department, during the brief time she was off of her oxygen her oxygen saturation dropped to 80% with a good waveform.  She appears confused but it is difficult to assess given her chronic dementia.  She is febrile to 101.5 and has a heart rate in the 120s and respiratory rate in the 30s.  It is unclear when her symptoms began or the onset pattern.  She recently had right sided hip surgery and has been in [facility] since that time.   Past Medical History  Diagnosis Date  . COPD (chronic obstructive pulmonary disease)   . Dementia   . OA (osteoarthritis)   . Hypertension     Patient Active Problem List   Diagnosis Date Noted  . Hip fracture requiring operative repair 02/16/2015  . Dementia 02/16/2015    Past Surgical History  Procedure Laterality Date  . Breast lumpectomy    . Tonsillectomy  2014    due to abscess  . Dilation and curettage, diagnostic / therapeutic    . Femur im nail Right 02/16/2015    Procedure: INTRAMEDULLARY (IM) NAIL FEMORAL;  Surgeon: Hessie Knows, MD;  Location: [facility] ORS;  Service: Orthopedics;  Laterality: Right;  . Right hip replacement    . Ivc    . Ivc filter      Current Outpatient Rx  Name  Route   Sig  Dispense  Refill  . calcium carbonate (OS-CAL) 600 MG TABS tablet   Oral   Take 600 mg by mouth daily.         . carvedilol (COREG) 3.125 MG tablet   Oral   Take 3.125 mg by mouth 2 (two) times daily with a meal.         . chlorhexidine (PERIDEX) 0.12 % solution   Mouth/Throat   Use as directed 15 mLs in the mouth or throat 3 (three) times daily.         . cholecalciferol (VITAMIN D) 1000 UNITS tablet   Oral   Take 1,000-2,000 Units by mouth daily. Pt takes two tablets on Tuesday and Friday.   Pt takes one tablet on Sunday, Monday, Wednesday, Thursday, and Saturday.         . citalopram (CELEXA) 20 MG tablet   Oral   Take 20 mg by mouth at bedtime.         . docusate sodium (COLACE) 100 MG capsule   Oral   Take 1 capsule (100 mg total) by mouth 2 (two) times daily.   10 capsule   0   . furosemide (LASIX) 20 MG tablet   Oral   Take 20 mg by mouth every other day.         . losartan (COZAAR) 100 MG tablet  Oral   Take 100 mg by mouth daily.         . mometasone-formoterol (DULERA) 200-5 MCG/ACT AERO   Inhalation   Inhale 2 puffs into the lungs 2 (two) times daily.   1 Inhaler   0   . Multiple Vitamins-Minerals (PRESERVISION AREDS 2) CAPS   Oral   Take 1 capsule by mouth daily.         . pantoprazole (PROTONIX) 40 MG tablet   Oral   Take 40 mg by mouth daily.         . potassium chloride SA (K-DUR,KLOR-CON) 20 MEQ tablet   Oral   Take 20 mEq by mouth 2 (two) times daily.         . predniSONE (DELTASONE) 20 MG tablet   Oral   Take 20 mg by mouth daily with breakfast.         . sodium fluoride (PREVIDENT 5000 PLUS) 1.1 % CREA dental cream   dental   Place 1 application onto teeth 3 (three) times daily after meals.          Marland Kitchen sulfaSALAzine (AZULFIDINE) 500 MG tablet   Oral   Take 500 mg by mouth 2 (two) times daily with a meal.         . tiotropium (SPIRIVA) 18 MCG inhalation capsule   Inhalation   Place 1 capsule (18 mcg  total) into inhaler and inhale daily.   30 capsule   12   . acetaminophen (TYLENOL) 325 MG tablet   Oral   Take 2 tablets (650 mg total) by mouth every 6 (six) hours as needed for mild pain (or Fever >/= 101).         Marland Kitchen amLODipine (NORVASC) 10 MG tablet   Oral   Take 10 mg by mouth daily.         . budesonide (PULMICORT) 0.5 MG/2ML nebulizer solution   Nebulization   Take 2 mLs (0.5 mg total) by nebulization 2 (two) times daily.   50 mL   0   . Fluticasone-Salmeterol (ADVAIR) 250-50 MCG/DOSE AEPB   Inhalation   Inhale 1 puff into the lungs 2 (two) times daily.         Marland Kitchen HYDROcodone-acetaminophen (NORCO/VICODIN) 5-325 MG per tablet   Oral   Take 1 tablet by mouth every 6 (six) hours as needed for moderate pain.   20 tablet   0   . LORazepam (ATIVAN) 0.5 MG tablet   Oral   Take 1 tablet (0.5 mg total) by mouth every 6 (six) hours as needed for anxiety.   20 tablet   0   . magnesium hydroxide (MILK OF MAGNESIA) 400 MG/5ML suspension   Oral   Take 30 mLs by mouth daily as needed for mild constipation.   360 mL   0   . menthol-cetylpyridinium (CEPACOL) 3 MG lozenge   Oral   Take 1 lozenge (3 mg total) by mouth as needed for sore throat (sore throat). Patient taking differently: Take 1 lozenge by mouth as needed for sore throat.    100 tablet   12   . methocarbamol (ROBAXIN) 500 MG tablet   Oral   Take 1 tablet (500 mg total) by mouth every 6 (six) hours as needed for muscle spasms.   20 tablet   0   . metoprolol tartrate (LOPRESSOR) 25 MG tablet   Oral   Take 75 mg by mouth 2 (two) times daily.         Marland Kitchen  ondansetron (ZOFRAN) 4 MG tablet   Oral   Take 1 tablet (4 mg total) by mouth every 6 (six) hours as needed for nausea.   20 tablet   0   . potassium & sodium phosphates (PHOS-NAK) 280-160-250 MG PACK   Oral   Take 1 packet by mouth 4 (four) times daily -  before meals and at bedtime.   15 packet   0   . EXPIRED: warfarin (COUMADIN) 2 MG  tablet   Oral   Take 1 tablet (2 mg total) by mouth daily at 6 PM.   2 tablet   0     Allergies Review of patient's allergies indicates no known allergies.  Family History  Problem Relation Age of Onset  . Parkinson's disease Mother     Social History History  Substance Use Topics  . Smoking status: Former Smoker -- 1.00 packs/day for 25 years    Types: Cigarettes  . Smokeless tobacco: Not on file  . Alcohol Use: No    Review of Systems Unable to obtain due to respiratory distress and dementia ____________________________________________   PHYSICAL EXAM:  ED Triage Vitals  Enc Vitals Group     BP 03/27/15 0400 120/72 mmHg     Pulse Rate 03/27/15 0400 115     Resp 03/27/15 0400 37     Temp 03/27/15 0400 101.5 F (38.6 C)     Temp Source 03/27/15 0400 Rectal     SpO2 03/27/15 0400 92 %     Weight 03/27/15 0338 146 lb (66.225 kg)     Height --      Head Cir --      Peak Flow --      Pain Score --      Pain Loc --      Pain Edu? --      Excl. in [place]? --       Constitutional: Elderly and ill-appearing in moderate respiratory distress. Eyes: Conjunctivae are normal. PERRL. EOMI. Head: Atraumatic. Nose: No congestion/rhinnorhea. Mouth/Throat: Mucous membranes are dry.  Oropharynx non-erythematous. Neck: No stridor.   Cardiovascular: Tachycardia, regular rhythm. Grossly normal heart sounds.  Good peripheral circulation. Respiratory: Increased respiratory effort, diminished breath sounds throughout, increased expiratory phase Gastrointestinal: Soft and nontender. No distention. No abdominal bruits. No CVA tenderness. Musculoskeletal: No lower extremity tenderness nor edema.  No joint effusions.  Well appearing surgical incision on the right hip without evidence of cellulitis/infection Neurologic:   No gross focal neurologic deficits are appreciated.  Skin:  Skin feels hot to the touch, dry and intact. No rash  noted.   ____________________________________________   LABS (all labs ordered are listed, but only abnormal results are displayed)  Labs Reviewed  COMPREHENSIVE METABOLIC PANEL - Abnormal; Notable for the following:    Chloride 91 (*)    Glucose, Bld 147 (*)    Calcium 8.7 (*)    Total Protein 5.8 (*)    Albumin 2.8 (*)    Alkaline Phosphatase 147 (*)    All other components within normal limits  LIPASE, BLOOD - Abnormal; Notable for the following:    Lipase 52 (*)    All other components within normal limits  TROPONIN I - Abnormal; Notable for the following:    Troponin I 0.08 (*)    All other components within normal limits  CBC WITH DIFFERENTIAL/PLATELET - Abnormal; Notable for the following:    WBC 16.0 (*)    RBC 3.42 (*)    Hemoglobin 10.0 (*)  HCT 31.0 (*)    RDW 14.8 (*)    Neutro Abs 14.2 (*)    Lymphs Abs 0.8 (*)    Monocytes Absolute 1.0 (*)    All other components within normal limits  BLOOD GAS, ARTERIAL - Abnormal; Notable for the following:    pH, Arterial 7.46 (*)    pCO2 arterial 52 (*)    pO2, Arterial 239 (*)    Bicarbonate 37.0 (*)    Acid-Base Excess 11.2 (*)    All other components within normal limits  URINALYSIS COMPLETEWITH MICROSCOPIC ([facility] ONLY) - Abnormal; Notable for the following:    Color, Urine AMBER (*)    APPearance CLOUDY (*)    Ketones, ur TRACE (*)    Protein, ur 100 (*)    Nitrite POSITIVE (*)    Leukocytes, UA 3+ (*)    Bacteria, UA MANY (*)    All other components within normal limits  CULTURE, BLOOD (ROUTINE X 2)  CULTURE, BLOOD (ROUTINE X 2)  URINE CULTURE  LACTIC ACID, PLASMA  LACTIC ACID, PLASMA   ____________________________________________  EKG  ED ECG REPORT I, Rea Kalama, the attending physician, personally viewed and interpreted this ECG.   Date: 03/27/2015  EKG Time: 03:33  Rate: 124  Rhythm: sinus tachycardia  Axis: Normal  Intervals:Incomplete right bundle branch block  ST&T Change:  Non-specific ST segment / T-wave changes, but no evidence of acute ischemia.   ____________________________________________  RADIOLOGY  I, Etoy Mcdonnell, personally viewed and evaluated these images as part of my medical decision making.   Dg Chest Port 1 View  03/27/2015   CLINICAL DATA:  Shortness of breath.  Sepsis.  EXAM: PORTABLE CHEST - 1 VIEW  COMPARISON:  02/22/2015  FINDINGS: Mild cardiac enlargement with mild vascular congestion. Slight increase since previous study. Suggestion of developing edema in the lung bases. Small bilateral pleural effusions. Calcified granulomas in the left lung and left hilum. Pleural calcification in the right chest. Calcified and tortuous aorta. Increased soft tissue in the left paratracheal region causing displacement of the trachea towards the right, likely representing a thyroid goiter. This is unchanged since previous study. Degenerative changes in both shoulders with loss of subacromial space indicating possible rotator cuff arthropathies. Lucency in the proximal right humerus probably represents benign bones cyst. No change since prior study.  IMPRESSION: Cardiac enlargement with mild increased pulmonary vascular congestion, developing edema in the lung bases, and small bilateral pleural effusions. Additional chronic findings are unchanged.   Electronically Signed   By: Lucienne Capers M.D.   On: 03/27/2015 04:01    ____________________________________________   PROCEDURES  Procedure(s) performed: None  Critical Care performed: Yes, see critical care note(s)  CRITICAL CARE Performed by: Hinda Kehr   Total critical care time: 45 minutes  Critical care time was exclusive of separately billable procedures and treating other patients.  Critical care was necessary to treat or prevent imminent or life-threatening deterioration.  Critical care was time spent personally by me on the following activities: development of treatment plan with  patient and/or surrogate as well as nursing, discussions with consultants, evaluation of patient's response to treatment, examination of patient, obtaining history from patient or surrogate, ordering and performing treatments and interventions, ordering and review of laboratory studies, ordering and review of radiographic studies, pulse oximetry and re-evaluation of patient's condition.  ____________________________________________   INITIAL IMPRESSION / ASSESSMENT AND PLAN / ED COURSE  Pertinent labs & imaging results that were available during my care of the patient  were reviewed by me and considered in my medical decision making (see chart for details).  The patient meets sepsis criteria based on temperature, hypoxemia, tachycardia, tachypnea.  I immediately initiated the code sepsis protocol including empiric antibiotics, 30 mL/kg of normal saline IV fluids, and a full battery of lab work.  The patient does not require BiPAP at this time, but I will continue treating her with DuoNeb labs as well as Solu-Medrol given her history of COPD.  ----------------------------------------- 4:44 AM on 03/27/2015 -----------------------------------------  Still awaiting lab results, but she has a leukocytosis of 16.  I met with the family and discussed her critical illness and discussed plans for admission.  I will call the hospitalist after obtaining more for lab results.  ----------------------------------------- 5:13 AM on 03/27/2015 -----------------------------------------  Patient's ABG is reassuring.  We switched her to 4 L of nasal cannula and she is maintaining her oxygenation at 98%.  Her lactate is 1.1.  We will slow down on her fluids so as not to flood her lungs.  She is getting Tylenol 650 mg by mouth for her fever.  Her urinalysis shows severe infection.  She also has a slightly positive troponin that is likely demand ischemia.  I have updated her family.  I discussed the case by phone  with Dr. Reece Levy; he is currently managing a critical patient in the CCU but will find me in the emergency department as soon as he has the opportunity.  ----------------------------------------- 5:52 AM on 03/27/2015 -----------------------------------------  Dr. Reece Levy has gone in to admit the patient.  She is currently on 5 L nasal cannula satting in the upper 90s.  She received 1.5 L of normal saline as a bolus and is now on a maintenance rate given that her lactate is only 1.1 and her blood pressure is well maintained and she has some evidence of pulmonary vascular congestion.  ____________________________________________  FINAL CLINICAL IMPRESSION(S) / ED DIAGNOSES  Final diagnoses:  Sepsis, due to unspecified organism  Acute respiratory failure with hypoxemia  Complicated UTI (urinary tract infection)      NEW MEDICATIONS STARTED DURING THIS VISIT:  New Prescriptions   No medications on file     Hinda Kehr, MD 03/27/15 [phone]

## 2015-03-27 NOTE — Consult Note (Signed)
[facility]  CARDIOLOGY CONSULT NOTE  Patient ID: Holly Drake MRN: [id number] DOB/AGE: 79/02/1924 79 y.o.  Admit date: 03/27/2015 Referring Physician Dr. Elisabeth PigeonVachhani Primary Physician   Primary Cardiologist   Reason for Consultation troponin  HPI: Patient is an 81 year old female with history of hypertension and reactive airway disease. She was admitted after being noted to be short of breath at her extended care facility. She was brought to the emergency room where she was somewhat hypoxic and tachycardic. She had a fever of 102 and an elevated white blood cell count. Chest x-ray revealed mild pulmonary vascular engorgement with small pleural effusions. She had a serum troponin drawn which was 0.08. Electrocardiogram revealed tachycardia with no ischemia. Patient is a difficult historian but denies chest pain and complains of some shortness of breath. She denies syncope. She has felt better since admission. She denies any previous cardiac history. She has an IVC filter in place. She is on warfarin apparently for DVT and pulmonary embolus prevention.  ROS Review of Systems - History obtained from chart review and the patient General ROS: positive for  - shortness of breath Respiratory ROS: positive for - cough, shortness of breath, tachypnea and wheezing Cardiovascular ROS: positive for - dyspnea on exertion, rapid heart rate and shortness of breath Gastrointestinal ROS: no abdominal pain, change in bowel habits, or black or bloody stools Musculoskeletal ROS: negative Neurological ROS: no TIA or stroke symptoms   Past Medical History  Diagnosis Date  . COPD (chronic obstructive pulmonary disease)   . Dementia   . OA (osteoarthritis)   . Hypertension     Family History  Problem Relation Age of Onset  . Parkinson's disease Mother     History   Social History  . Marital Status: Single    Spouse Name: N/A  . Number of Children: N/A  . Years of  Education: N/A   Occupational History  . Not on file.   Social History Main Topics  . Smoking status: Former Smoker -- 1.00 packs/day for 25 years    Types: Cigarettes  . Smokeless tobacco: Not on file  . Alcohol Use: No  . Drug Use: No  . Sexual Activity: Not on file   Other Topics Concern  . Not on file   Social History Narrative    Past Surgical History  Procedure Laterality Date  . Breast lumpectomy    . Tonsillectomy  2014    due to abscess  . Dilation and curettage, diagnostic / therapeutic    . Femur im nail Right 02/16/2015    Procedure: INTRAMEDULLARY (IM) NAIL FEMORAL;  Surgeon: Kennedy BuckerMichael Menz, MD;  Location: [facility] ORS;  Service: Orthopedics;  Laterality: Right;  . Right hip replacement    . Ivc    . Ivc filter    . Tonsillectomy    . Joint replacement       Prescriptions prior to admission  Medication Sig Dispense Refill Last Dose  . acetaminophen (TYLENOL) 325 MG tablet Take 2 tablets (650 mg total) by mouth every 6 (six) hours as needed for mild pain (or Fever >/= 101).   03/26/2015 at Unknown time  . amLODipine (NORVASC) 10 MG tablet Take 10 mg by mouth daily.   03/26/2015 at Unknown time  . budesonide (PULMICORT) 0.5 MG/2ML nebulizer solution Take 2 mLs (0.5 mg total) by nebulization 2 (two) times daily. 50 mL 0 03/26/2015 at Unknown time  . calcium carbonate (OS-CAL) 600 MG TABS tablet Take  600 mg by mouth daily.   03/26/2015 at Unknown time  . carvedilol (COREG) 3.125 MG tablet Take 3.125 mg by mouth 2 (two) times daily with a meal.   03/26/2015 at Unknown time  . chlorhexidine (PERIDEX) 0.12 % solution Use as directed 15 mLs in the mouth or throat 3 (three) times daily.   03/26/2015 at Unknown time  . cholecalciferol (VITAMIN D) 1000 UNITS tablet Take 1,000-2,000 Units by mouth daily. Pt takes two tablets on Tuesday and Friday.   Pt takes one tablet on Sunday, Monday, Wednesday, Thursday, and Saturday.   03/26/2015 at Unknown time  . citalopram (CELEXA) 20 MG tablet  Take 20 mg by mouth at bedtime.   03/26/2015 at Unknown time  . docusate sodium (COLACE) 100 MG capsule Take 1 capsule (100 mg total) by mouth 2 (two) times daily. 10 capsule 0 03/26/2015 at Unknown time  . Fluticasone-Salmeterol (ADVAIR) 250-50 MCG/DOSE AEPB Inhale 1 puff into the lungs 2 (two) times daily.   03/26/2015 at Unknown time  . furosemide (LASIX) 20 MG tablet Take 20 mg by mouth every other day.   03/26/2015 at Unknown time  . HYDROcodone-acetaminophen (NORCO/VICODIN) 5-325 MG per tablet Take 1 tablet by mouth every 6 (six) hours as needed for moderate pain. 20 tablet 0 03/26/2015 at Unknown time  . LORazepam (ATIVAN) 0.5 MG tablet Take 1 tablet (0.5 mg total) by mouth every 6 (six) hours as needed for anxiety. 20 tablet 0 03/26/2015 at Unknown time  . losartan (COZAAR) 100 MG tablet Take 100 mg by mouth daily.   03/26/2015 at Unknown time  . magnesium hydroxide (MILK OF MAGNESIA) 400 MG/5ML suspension Take 30 mLs by mouth daily as needed for mild constipation. 360 mL 0 03/26/2015 at Unknown time  . menthol-cetylpyridinium (CEPACOL) 3 MG lozenge Take 1 lozenge (3 mg total) by mouth as needed for sore throat (sore throat). (Patient taking differently: Take 1 lozenge by mouth as needed for sore throat. ) 100 tablet 12 03/26/2015 at Unknown time  . methocarbamol (ROBAXIN) 500 MG tablet Take 1 tablet (500 mg total) by mouth every 6 (six) hours as needed for muscle spasms. 20 tablet 0 03/26/2015 at Unknown time  . metoprolol tartrate (LOPRESSOR) 25 MG tablet Take 75 mg by mouth 2 (two) times daily.   03/26/2015 at Unknown time  . mometasone-formoterol (DULERA) 200-5 MCG/ACT AERO Inhale 2 puffs into the lungs 2 (two) times daily. 1 Inhaler 0 03/26/2015 at Unknown time  . Multiple Vitamins-Minerals (PRESERVISION AREDS 2) CAPS Take 1 capsule by mouth daily.   03/26/2015 at Unknown time  . ondansetron (ZOFRAN) 4 MG tablet Take 1 tablet (4 mg total) by mouth every 6 (six) hours as needed for nausea. 20 tablet 0  03/26/2015 at Unknown time  . pantoprazole (PROTONIX) 40 MG tablet Take 40 mg by mouth daily.   03/26/2015 at Unknown time  . potassium & sodium phosphates (PHOS-NAK) 280-160-250 MG PACK Take 1 packet by mouth 4 (four) times daily -  before meals and at bedtime. 15 packet 0 03/26/2015 at Unknown time  . potassium chloride SA (K-DUR,KLOR-CON) 20 MEQ tablet Take 20 mEq by mouth 2 (two) times daily.   03/26/2015 at Unknown time  . predniSONE (DELTASONE) 20 MG tablet Take 20 mg by mouth daily with breakfast.   03/26/2015 at Unknown time  . sodium fluoride (PREVIDENT 5000 PLUS) 1.1 % CREA dental cream Place 1 application onto teeth 3 (three) times daily after meals.    03/26/2015 at Unknown  time  . sulfaSALAzine (AZULFIDINE) 500 MG tablet Take 500 mg by mouth 2 (two) times daily with a meal.   03/26/2015 at Unknown time  . tiotropium (SPIRIVA) 18 MCG inhalation capsule Place 1 capsule (18 mcg total) into inhaler and inhale daily. 30 capsule 12 03/26/2015 at Unknown time  . warfarin (COUMADIN) 2 MG tablet Take 1 tablet (2 mg total) by mouth daily at 6 PM. 2 tablet 0 unknown at unknown    Physical Exam: Blood pressure 121/52, pulse 84, temperature 98.3 F (36.8 C), temperature source Oral, resp. rate 19, weight 66.225 kg (146 lb), SpO2 93 %. .  General appearance: appears older than stated age and slowed mentation Head: Normocephalic, without obvious abnormality, atraumatic Resp: rhonchi bilaterally and wheezes bilaterally Cardio: regular rate and rhythm and systolic murmur: early systolic 2/6, medium pitch at lower left sternal border GI: soft, non-tender; bowel sounds normal; no masses,  no organomegaly Extremities: extremities normal, atraumatic, no cyanosis or edema Pulses: 2+ and symmetric diminished pulses bilaterally Neurologic: Grossly normal Labs:   Lab Results  Component Value Date   WBC 16.0* 03/27/2015   HGB 10.0* 03/27/2015   HCT 31.0* 03/27/2015   MCV 90.8 03/27/2015   PLT 389  03/27/2015    Recent Labs Lab 03/27/15 0348  NA 136  K 3.7  CL 91*  CO2 31  BUN 8  CREATININE 0.51  CALCIUM 8.7*  PROT 5.8*  BILITOT 0.8  ALKPHOS 147*  ALT 17  AST 28  GLUCOSE 147*   Lab Results  Component Value Date   CKTOTAL 69 02/15/2015   TROPONINI 0.08* 03/27/2015      Radiology: Bilateral pulmonary vascular engorgement with cardiac enlargement. There are small bilateral pleural effusions. EKG: Sinus tachycardia  ASSESSMENT AND PLAN: Patient with history dementia, COPD, hypertension as well as history of a vena caval filter in place on warfarin who was admitted after being noted to be short of breath and tachypnea take at her care facility. In the emergency room she was relatively hypoxic and febrile and an elevated white count. She had a serum troponin of 0.08. EKG did not show active ischemia. Patient denies chest pain but complains of shortness of breath. This appears to be demand ischemia secondary to pulmonary symptoms. She also has a febrile illness with elevated fever and elevated heart rate and white count. Given her comorbid conditions, she does not appear to be a candidate for invasive or noninvasive ischemic workup. Would continue aggressively treat her reactive airway disease and follow for further worsening hemodynamically. Signed: Dalia Heading MD, [facility] 03/27/2015, 4:46 PM

## 2015-03-27 NOTE — H&P (Signed)
[facility] Physicians - [place] at [facility]   PATIENT NAME: Holly Drake    MR#:  [id number]  DATE OF BIRTH:  19-Nov-1923  DATE OF ADMISSION:  03/27/2015  PRIMARY CARE PHYSICIAN: PROVIDER NOT IN SYSTEM   REQUESTING/REFERRING PHYSICIAN: Corliss Parish  CHIEF COMPLAINT:   Chief Complaint  Patient presents with  . Respiratory Distress   fever HISTORY OF PRESENT ILLNESS:  Holly Drake  is a 81 y.o. female, with below mentioned past medical history, resident of [facility] was brought in by EMS with the complaints of worsening shortness of breath of one-week duration with acute worsening earlier today with hypoxia and a temperature of 10 80F. In the emergency room on arrival patient was noted to be in severe respiratory distress, was on nonrebreather mask and O2 saturations were in the upper 70s. Patient has a DNR/DNI order. Patient was evaluated by the ED physician and workup revealed white blood cell count of 16.0, chest x-ray with mildly increased pulmonary vascular congestion, developing any mild lung bases, bilateral small pleural effusions. Urinalysis remarkable for significant UTI. EKG sinus tachycardia with ventricular rate of 1 24 bpm. Troponin mildly elevated at 0.08. Patient was treated with vigorous nebs, continued on oxygen supplementation with a nonrebreather mask and transitioned to nasal cannula once O2 saturations improved, IV Solu-Medrol. After obtaining blood and urine cultures patient was started on IV vancomycin and Zosyn. According to the patient's daughter and son-in-law work with the patient at this time patient underwent right hip replacement following a fracture about 5/2 weeks ago and was later discharged to rehabilitation until one week ago when she was transferred to assisted living place. Ever since he has been in assisted living place she has been having shortness of breath with wheezing for the past 1 week. No history of any chest pain,  nausea, vomiting, diarrhea, abdominal pain. Patient is currently maintained on oxygen supplementation per nasal cannula and comfortably resting in bed.  PAST MEDICAL HISTORY:   Past Medical History  Diagnosis Date  . COPD (chronic obstructive pulmonary disease)   . Dementia   . OA (osteoarthritis)   . Hypertension     PAST SURGICAL HISTORY:   Past Surgical History  Procedure Laterality Date  . Breast lumpectomy    . Tonsillectomy  2014    due to abscess  . Dilation and curettage, diagnostic / therapeutic    . Femur im nail Right 02/16/2015    Procedure: INTRAMEDULLARY (IM) NAIL FEMORAL;  Surgeon: Kennedy Bucker, MD;  Location: [facility] ORS;  Service: Orthopedics;  Laterality: Right;  . Right hip replacement    . Ivc    . Ivc filter    . Tonsillectomy    . Joint replacement      SOCIAL HISTORY:   History  Substance Use Topics  . Smoking status: Former Smoker -- 1.00 packs/day for 25 years    Types: Cigarettes  . Smokeless tobacco: Not on file  . Alcohol Use: No    FAMILY HISTORY:   Family History  Problem Relation Age of Onset  . Parkinson's disease Mother     DRUG ALLERGIES:  No Known Allergies  REVIEW OF SYSTEMS:   Review of Systems  Constitutional: Positive for fever. Negative for chills and malaise/fatigue.  HENT: Negative for ear pain, hearing loss, nosebleeds, sore throat and tinnitus.   Eyes: Negative for blurred vision, double vision, pain, discharge and redness.  Respiratory: Positive for cough, shortness of breath and wheezing. Negative for  hemoptysis and sputum production.   Cardiovascular: Negative for chest pain, palpitations, orthopnea and leg swelling.  Gastrointestinal: Negative for nausea, vomiting, abdominal pain, diarrhea, constipation, blood in stool and melena.  Genitourinary: Negative for dysuria, urgency, frequency and hematuria.  Musculoskeletal: Negative for back pain, joint pain and neck pain.  Skin: Negative for itching and rash.   Neurological: Negative for dizziness, tingling, sensory change, focal weakness and seizures.  Endo/Heme/Allergies: Does not bruise/bleed easily.  Psychiatric/Behavioral: Positive for depression. The patient is nervous/anxious.     MEDICATIONS AT HOME:   Prior to Admission medications   Medication Sig Start Date End Date Taking? Authorizing Provider  calcium carbonate (OS-CAL) 600 MG TABS tablet Take 600 mg by mouth daily.   Yes Historical Provider, MD  carvedilol (COREG) 3.125 MG tablet Take 3.125 mg by mouth 2 (two) times daily with a meal.   Yes Historical Provider, MD  chlorhexidine (PERIDEX) 0.12 % solution Use as directed 15 mLs in the mouth or throat 3 (three) times daily.   Yes Historical Provider, MD  cholecalciferol (VITAMIN D) 1000 UNITS tablet Take 1,000-2,000 Units by mouth daily. Pt takes two tablets on Tuesday and Friday.   Pt takes one tablet on Sunday, Monday, Wednesday, Thursday, and Saturday.   Yes Historical Provider, MD  citalopram (CELEXA) 20 MG tablet Take 20 mg by mouth at bedtime.   Yes Historical Provider, MD  docusate sodium (COLACE) 100 MG capsule Take 1 capsule (100 mg total) by mouth 2 (two) times daily. 02/21/15  Yes Ramonita Lab, MD  furosemide (LASIX) 20 MG tablet Take 20 mg by mouth every other day.   Yes Historical Provider, MD  losartan (COZAAR) 100 MG tablet Take 100 mg by mouth daily.   Yes Historical Provider, MD  mometasone-formoterol (DULERA) 200-5 MCG/ACT AERO Inhale 2 puffs into the lungs 2 (two) times daily. 02/21/15  Yes Ramonita Lab, MD  Multiple Vitamins-Minerals (PRESERVISION AREDS 2) CAPS Take 1 capsule by mouth daily.   Yes Historical Provider, MD  pantoprazole (PROTONIX) 40 MG tablet Take 40 mg by mouth daily.   Yes Historical Provider, MD  potassium chloride SA (K-DUR,KLOR-CON) 20 MEQ tablet Take 20 mEq by mouth 2 (two) times daily.   Yes Historical Provider, MD  predniSONE (DELTASONE) 20 MG tablet Take 20 mg by mouth daily with breakfast.   Yes  Historical Provider, MD  sodium fluoride (PREVIDENT 5000 PLUS) 1.1 % CREA dental cream Place 1 application onto teeth 3 (three) times daily after meals.    Yes Historical Provider, MD  sulfaSALAzine (AZULFIDINE) 500 MG tablet Take 500 mg by mouth 2 (two) times daily with a meal.   Yes Historical Provider, MD  tiotropium (SPIRIVA) 18 MCG inhalation capsule Place 1 capsule (18 mcg total) into inhaler and inhale daily. 02/21/15  Yes Ramonita Lab, MD  acetaminophen (TYLENOL) 325 MG tablet Take 2 tablets (650 mg total) by mouth every 6 (six) hours as needed for mild pain (or Fever >/= 101). 02/21/15   Ramonita Lab, MD  amLODipine (NORVASC) 10 MG tablet Take 10 mg by mouth daily.    Historical Provider, MD  budesonide (PULMICORT) 0.5 MG/2ML nebulizer solution Take 2 mLs (0.5 mg total) by nebulization 2 (two) times daily. 02/21/15   Ramonita Lab, MD  Fluticasone-Salmeterol (ADVAIR) 250-50 MCG/DOSE AEPB Inhale 1 puff into the lungs 2 (two) times daily.    Historical Provider, MD  HYDROcodone-acetaminophen (NORCO/VICODIN) 5-325 MG per tablet Take 1 tablet by mouth every 6 (six) hours as needed for moderate  pain. 02/21/15   Ramonita Lab, MD  LORazepam (ATIVAN) 0.5 MG tablet Take 1 tablet (0.5 mg total) by mouth every 6 (six) hours as needed for anxiety. 02/21/15   Ramonita Lab, MD  magnesium hydroxide (MILK OF MAGNESIA) 400 MG/5ML suspension Take 30 mLs by mouth daily as needed for mild constipation. 02/21/15   Ramonita Lab, MD  menthol-cetylpyridinium (CEPACOL) 3 MG lozenge Take 1 lozenge (3 mg total) by mouth as needed for sore throat (sore throat). Patient taking differently: Take 1 lozenge by mouth as needed for sore throat.  02/21/15   Ramonita Lab, MD  methocarbamol (ROBAXIN) 500 MG tablet Take 1 tablet (500 mg total) by mouth every 6 (six) hours as needed for muscle spasms. 02/21/15   Ramonita Lab, MD  metoprolol tartrate (LOPRESSOR) 25 MG tablet Take 75 mg by mouth 2 (two) times daily.    Historical Provider, MD  ondansetron  (ZOFRAN) 4 MG tablet Take 1 tablet (4 mg total) by mouth every 6 (six) hours as needed for nausea. 02/21/15   Ramonita Lab, MD  potassium & sodium phosphates (PHOS-NAK) 280-160-250 MG PACK Take 1 packet by mouth 4 (four) times daily -  before meals and at bedtime. 02/21/15   Ramonita Lab, MD  warfarin (COUMADIN) 2 MG tablet Take 1 tablet (2 mg total) by mouth daily at 6 PM. 02/21/15 02/22/15  Ramonita Lab, MD      VITAL SIGNS:  Blood pressure 123/103, pulse 113, temperature 98.9 F (37.2 C), temperature source Rectal, resp. rate 31, weight 66.225 kg (146 lb), SpO2 99 %.  PHYSICAL EXAMINATION:  Physical Exam  Constitutional: She appears well-developed and well-nourished. No distress.  HENT:  Head: Normocephalic and atraumatic.  Right Ear: External ear normal.  Left Ear: External ear normal.  Nose: Nose normal.  Mouth/Throat: Oropharynx is clear and moist. No oropharyngeal exudate.  Eyes: EOM are normal. Pupils are equal, round, and reactive to light. No scleral icterus.  Neck: Normal range of motion. Neck supple. No JVD present. No thyromegaly present.  Cardiovascular: Normal rate, regular rhythm, normal heart sounds and intact distal pulses.  Exam reveals no friction rub.   No murmur heard. Respiratory: Effort normal. No respiratory distress. She has wheezes. She has no rales. She exhibits no tenderness.  GI: Soft. Bowel sounds are normal. She exhibits no distension and no mass. There is no tenderness. There is no rebound and no guarding.  Musculoskeletal: Normal range of motion. She exhibits no edema.  Lymphadenopathy:    She has no cervical adenopathy.  Neurological: She is alert. She has normal reflexes. She displays normal reflexes. No cranial nerve deficit. She exhibits normal muscle tone.  Alert and oriented to self, following commands.  Skin: Skin is warm. No rash noted. No erythema.  Psychiatric: She has a normal mood and affect. Her behavior is normal. Thought content normal.    LABORATORY PANEL:   CBC  Recent Labs Lab 03/27/15 0348  WBC 16.0*  HGB 10.0*  HCT 31.0*  PLT 389   ------------------------------------------------------------------------------------------------------------------  Chemistries   Recent Labs Lab 03/27/15 0348  NA 136  K 3.7  CL 91*  CO2 31  GLUCOSE 147*  BUN 8  CREATININE 0.51  CALCIUM 8.7*  AST 28  ALT 17  ALKPHOS 147*  BILITOT 0.8   ------------------------------------------------------------------------------------------------------------------  Cardiac Enzymes  Recent Labs Lab 03/27/15 0348  TROPONINI 0.08*   ------------------------------------------------------------------------------------------------------------------  RADIOLOGY:  Dg Chest Port 1 View  03/27/2015   CLINICAL DATA:  Shortness of breath.  Sepsis.  EXAM: PORTABLE CHEST - 1 VIEW  COMPARISON:  02/22/2015  FINDINGS: Mild cardiac enlargement with mild vascular congestion. Slight increase since previous study. Suggestion of developing edema in the lung bases. Small bilateral pleural effusions. Calcified granulomas in the left lung and left hilum. Pleural calcification in the right chest. Calcified and tortuous aorta. Increased soft tissue in the left paratracheal region causing displacement of the trachea towards the right, likely representing a thyroid goiter. This is unchanged since previous study. Degenerative changes in both shoulders with loss of subacromial space indicating possible rotator cuff arthropathies. Lucency in the proximal right humerus probably represents benign bones cyst. No change since prior study.  IMPRESSION: Cardiac enlargement with mild increased pulmonary vascular congestion, developing edema in the lung bases, and small bilateral pleural effusions. Additional chronic findings are unchanged.   Electronically Signed   By: Burman NievesWilliam  Stevens M.D.   On: 03/27/2015 04:01    EKG:   Orders placed or performed during the hospital  encounter of 03/27/15  . EKG 12-Lead sinus tachycardia with ventricular rate of 1 24 bpm, incomplete right bundle branch block.   . EKG 12-Lead    IMPRESSION AND PLAN:   1. Acute on chronic respiratory failure with hypoxia secondary to combination of COPD exacerbation, pulmonary vascular congestion and possible pneumonia. 2. COPD exacerbation secondary to possible pneumonia. 3. Sepsis-UTI, possible pneumonia-healthcare associated pneumonia. 4. Mildly elevated troponin. No history of cardiac disease in the past, no chest pain. Likely demand ischemia, rule out acute coronary event. Plan: Admit to telemetry, continue O2 supplementation, follow-up O2 sats, vigorous DuoNeb's, IV Solu-Medrol, IV antibiotics-Vanco and Zosyn, cycle cardiac enzymes, continue aspirin, beta blocker, request echocardiogram and cardiology consultation for further advice regarding elevated troponin. 5. Hypertension, stable on home medications. Continue same. 6. Dementia, stable. Continue supportive care 7. GAD/depression, stable on home medications. Continue same. 8. History of PE/DVT, status post IVC filter placement. Patient on Coumadin. Continue same.    All the records are reviewed and case discussed with ED provider. Management plans discussed with the patient, family and they are in agreement.  CODE STATUS: DO NOT RESUSCITATE(confirmed with patient's daughter who is with the patient at this time)  TOTAL TIME TAKING CARE OF THIS PATIENT: 50 minutes.    Crissie FiguresEDDY, Vence Lalor N M.D on 03/27/2015 at 6:29 AM  Between 7am to 6pm - Pager - [phone]  After 6pm go to [email or web address] - password [email or web address] [facility]  [facility]  Office  [phone]  CC: Primary care physician; PROVIDER NOT IN SYSTEM

## 2015-03-27 NOTE — ED Notes (Signed)
MD at bedside. 

## 2015-03-27 NOTE — Progress Notes (Signed)
[facility] Physicians - [place] at [facility]   PATIENT NAME: Holly Drake    MR#:  [id number]  DATE OF BIRTH:  08/26/1924  SUBJECTIVE:  CHIEF COMPLAINT:   Chief Complaint  Patient presents with  . Respiratory Distress    She is not in distress, but have dementia.  REVIEW OF SYSTEMS:  Pt have dementia,s he is alert , but not able to give ROS.  ROS  DRUG ALLERGIES:  No Known Allergies  VITALS:  Blood pressure 121/52, pulse 84, temperature 98.3 F (36.8 C), temperature source Oral, resp. rate 19, weight 66.225 kg (146 lb), SpO2 97 %.  PHYSICAL EXAMINATION:  GENERAL:  81 y.o.-year-old patient lying in the bed with no acute distress.  EYES: Pupils equal, round, reactive to light and accommodation. No scleral icterus. Extraocular muscles intact.  HEENT: Head atraumatic, normocephalic. Oropharynx and nasopharynx clear.  NECK:  Supple, no jugular venous distention. No thyroid enlargement, no tenderness.  LUNGS: Normal breath sounds bilaterally, mild wheezing,No crepitation. No use of accessory muscles of respiration.  CARDIOVASCULAR: S1, S2 normal. No murmurs, rubs, or gallops.  ABDOMEN: Soft, nontender, nondistended. Bowel sounds present. No organomegaly or mass.  EXTREMITIES: No pedal edema, cyanosis, or clubbing.  NEUROLOGIC: Cranial nerves II through XII are intact. Muscle strength 4/5 in all extremities.  Gait not checked.  PSYCHIATRIC: The patient is alert but not oriented. SKIN: No obvious rash, lesion, or ulcer.   Physical Exam LABORATORY PANEL:   CBC  Recent Labs Lab 03/27/15 0348  WBC 16.0*  HGB 10.0*  HCT 31.0*  PLT 389   ------------------------------------------------------------------------------------------------------------------  Chemistries   Recent Labs Lab 03/27/15 0348  NA 136  K 3.7  CL 91*  CO2 31  GLUCOSE 147*  BUN 8  CREATININE 0.51  CALCIUM 8.7*  AST 28  ALT 17  ALKPHOS 147*  BILITOT 0.8    ------------------------------------------------------------------------------------------------------------------  Cardiac Enzymes  Recent Labs Lab 03/27/15 0348  TROPONINI 0.08*   ------------------------------------------------------------------------------------------------------------------  RADIOLOGY:  Dg Chest Port 1 View  03/27/2015   CLINICAL DATA:  Shortness of breath.  Sepsis.  EXAM: PORTABLE CHEST - 1 VIEW  COMPARISON:  02/22/2015  FINDINGS: Mild cardiac enlargement with mild vascular congestion. Slight increase since previous study. Suggestion of developing edema in the lung bases. Small bilateral pleural effusions. Calcified granulomas in the left lung and left hilum. Pleural calcification in the right chest. Calcified and tortuous aorta. Increased soft tissue in the left paratracheal region causing displacement of the trachea towards the right, likely representing a thyroid goiter. This is unchanged since previous study. Degenerative changes in both shoulders with loss of subacromial space indicating possible rotator cuff arthropathies. Lucency in the proximal right humerus probably represents benign bones cyst. No change since prior study.  IMPRESSION: Cardiac enlargement with mild increased pulmonary vascular congestion, developing edema in the lung bases, and small bilateral pleural effusions. Additional chronic findings are unchanged.   Electronically Signed   By: Burman NievesWilliam  Stevens M.D.   On: 03/27/2015 04:01    ASSESSMENT AND PLAN:   1. Acute on chronic respiratory failure with hypoxia secondary to combination of COPD exacerbation, pulmonary vascular congestion and possible pneumonia.   Now on nasal canula oxygen. 2. COPD exacerbation secondary to possible pneumonia. On Iv steroids, Nebs, Abx. 3. Sepsis-UTI & possible pneumonia-healthcare associated pneumonia.   Broad spectrum Abx- cx sent. 4. Mildly elevated troponin. No history of cardiac disease in the past, no chest  pain. Likely demand ischemia,  Due to infection. On  telemetry, continue O2 supplementation   continue aspirin, beta blocker 5. Hypertension, stable on home medications. Continue same. 6. Dementia, stable. Continue supportive care 7. GAD/depression, stable on home medications. Continue same. 8. History of PE/DVT, status post IVC filter placement. Patient on Coumadin.     INR is high- hold now- Pharmacy to adjust dose.   All the records are reviewed and case discussed with Care Management/Social Workerr. Management plans discussed with the patient, family and they are in agreement.  CODE STATUS: DNR  TOTAL TIME TAKING CARE OF THIS PATIENT: 35  minutes.   More than 50% of the visit was spent in counseling/coordination of care  POSSIBLE D/C IN 2-3  DAYS, DEPENDING ON CLINICAL CONDITION.   Altamese Dilling M.D on 03/27/2015   Between 7am to 6pm - Pager - [phone]  After 6pm go to [email or web address] - password [email or web address] [email or web address]  [place] [facility]  Office  [phone]  CC: Primary care physician; PROVIDER NOT IN SYSTEM

## 2015-03-27 NOTE — Clinical Social Work Note (Signed)
Clinical Social Work Assessment  Patient Details  Name: Holly Drake MRN: [id number] Date of Birth: 10/13/1923  Date of referral:  03/27/15               Reason for consult:  [facility] Pl[facility]                Permission sought to share information with:  Family Supports, Oceanographeracility Contact Representative Permission granted to share information::  Yes, Verbal Permission Granted  Name::        Agency::     Relationship::     Contact Information:     Housing/Transportation Living arrangements for the past 2 months:  Assisted Living Facility Source of Information:  Patient, Facility Patient Interpreter Needed:  None Criminal Activity/Legal Involvement Pertinent to Current Situation/Hospitalization:  No - Comment as needed Significant Relationships:    Lives with:  Facility Resident Do you feel safe going back to the place where you live?  Yes Need for family participation in patient care:  Yes (Comment)  Care giving concerns:  Current concerns are getting pt back to ALF when medically stable.   Social Worker assessment / plan:  CSW spoke to pt.  She was oriented to self.  CSW was able to confirm that [facility].  CSW will attempt to contact pt's family again tomorrow.    Employment status:  Retired Health and safety inspectornsurance information:  Medicare PT Recommendations:    Information / Referral to community resources:     Patient/Family's Response to care:  Pt was in agreement with DC back to ALF   Patient/Family's Understanding of and Emotional Response to Diagnosis, Current Treatment, and Prognosis:  CSW will attempt to contact pt's family again to confirm understanding of DC plan back to ALF.   Emotional Assessment Appearance:  Appears stated age Attitude/Demeanor/Rapport:   (pleasent) Affect (typically observed):   (unremarkable) Orientation:  Oriented to Self Alcohol / Substance use:  Never Used Psych involvement (Current and /or in the community):  No  (Comment)  Discharge Needs  Concerns to be addressed:  Care Coordination Readmission within the last 30 days:  No Current discharge risk:  Physical Impairment Barriers to Discharge:      Chauncy PassyBennerson, Chirag Krueger J, LCSW 03/27/2015, 3:50 PM

## 2015-03-28 LAB — CBC
HEMATOCRIT: 27.7 % — AB (ref 35.0–47.0)
Hemoglobin: 8.8 g/dL — ABNORMAL LOW (ref 12.0–16.0)
MCH: 28.6 pg (ref 26.0–34.0)
MCHC: 31.6 g/dL — ABNORMAL LOW (ref 32.0–36.0)
MCV: 90.3 fL (ref 80.0–100.0)
Platelets: 384 10*3/uL (ref 150–440)
RBC: 3.07 MIL/uL — ABNORMAL LOW (ref 3.80–5.20)
RDW: 14.2 % (ref 11.5–14.5)
WBC: 11.4 10*3/uL — ABNORMAL HIGH (ref 3.6–11.0)

## 2015-03-28 LAB — PROTIME-INR
INR: 7.11
PROTHROMBIN TIME: 60.6 s — AB (ref 11.4–15.0)

## 2015-03-28 LAB — GLUCOSE, CAPILLARY: Glucose-Capillary: 156 mg/dL — ABNORMAL HIGH (ref 65–99)

## 2015-03-28 MED ORDER — IPRATROPIUM-ALBUTEROL 0.5-2.5 (3) MG/3ML IN SOLN
3.0000 mL | Freq: Four times a day (QID) | RESPIRATORY_TRACT | Status: DC | PRN
  Administered 2015-03-29 – 2015-03-30 (×2): 3 mL via RESPIRATORY_TRACT
  Filled 2015-03-28 (×2): qty 3

## 2015-03-28 MED ORDER — PHYTONADIONE 5 MG PO TABS
5.0000 mg | ORAL_TABLET | Freq: Once | ORAL | Status: AC
  Administered 2015-03-28: 5 mg via ORAL
  Filled 2015-03-28: qty 1

## 2015-03-28 NOTE — Clinical Social Work Note (Signed)
CSW confirmed that [facility] is [facility].  DC packet to include CXR and labs.  [facility] [phone]

## 2015-03-28 NOTE — Care Management (Signed)
Patient was [facility] one week prior to this [facility].  She had [facility] for [facility] fracture.  Prior to the hip fracture [facility]  Prior to this episode of illness and hip fracture, patient was primarily confined to wheelchair but was able to assist with transfers.  She is on chronic home [facility] through [facility].  No home health had been ordered when [facility].  Attending verbalized concern that the facility did not have a nebulizer machine but informed that there patient had a nebulizer machine delivered to the facility by [facility].  Will anticipated need for home health nursing at least if patient is able to discharge directly [facility].

## 2015-03-28 NOTE — Progress Notes (Signed)
[facility] Physicians - [place] at [facility]   PATIENT NAME: Holly Drake    MR#:  [id number]  DATE OF BIRTH:  27-Sep-1923  SUBJECTIVE:  CHIEF COMPLAINT:   Chief Complaint  Patient presents with  . Respiratory Distress    She is not in distress, but have dementia. On 3-4 ltr oxygen.  REVIEW OF SYSTEMS:  Pt have dementia,she is alert , but not able to give ROS.  ROS  DRUG ALLERGIES:  No Known Allergies  VITALS:  Blood pressure 131/56, pulse 73, temperature 98.1 F (36.7 C), temperature source Axillary, resp. rate 19, height  (1.6 m), weight 62.687 kg (138 lb 3.2 oz), SpO2 95 %.  PHYSICAL EXAMINATION:  GENERAL:  81 y.o.-year-old patient lying in the bed with no acute distress.  EYES: Pupils equal, round, reactive to light and accommodation. No scleral icterus. Extraocular muscles intact.  HEENT: Head atraumatic, normocephalic. Oropharynx and nasopharynx clear.  NECK:  Supple, no jugular venous distention. No thyroid enlargement, no tenderness.  LUNGS: Normal breath sounds bilaterally, mild wheezing,No crepitation. No use of accessory muscles of respiration.  CARDIOVASCULAR: S1, S2 normal. No murmurs, rubs, or gallops.  ABDOMEN: Soft, nontender, nondistended. Bowel sounds present. No organomegaly or mass.  EXTREMITIES: No pedal edema, cyanosis, or clubbing.  NEUROLOGIC: Cranial nerves II through XII are intact. Muscle strength 4/5 in all extremities.  Gait not checked.  PSYCHIATRIC: The patient is alert but not oriented. SKIN: No obvious rash, lesion, or ulcer.   Physical Exam LABORATORY PANEL:   CBC  Recent Labs Lab 03/28/15 0348  WBC 11.4*  HGB 8.8*  HCT 27.7*  PLT 384   ------------------------------------------------------------------------------------------------------------------  Chemistries   Recent Labs Lab 03/27/15 0348  NA 136  K 3.7  CL 91*  CO2 31  GLUCOSE 147*  BUN 8  CREATININE 0.51  CALCIUM 8.7*  AST 28  ALT 17   ALKPHOS 147*  BILITOT 0.8   ------------------------------------------------------------------------------------------------------------------  Cardiac Enzymes  Recent Labs Lab 03/27/15 0348  TROPONINI 0.08*   ------------------------------------------------------------------------------------------------------------------  RADIOLOGY:  Dg Chest Port 1 View  03/27/2015   CLINICAL DATA:  Shortness of breath.  Sepsis.  EXAM: PORTABLE CHEST - 1 VIEW  COMPARISON:  02/22/2015  FINDINGS: Mild cardiac enlargement with mild vascular congestion. Slight increase since previous study. Suggestion of developing edema in the lung bases. Small bilateral pleural effusions. Calcified granulomas in the left lung and left hilum. Pleural calcification in the right chest. Calcified and tortuous aorta. Increased soft tissue in the left paratracheal region causing displacement of the trachea towards the right, likely representing a thyroid goiter. This is unchanged since previous study. Degenerative changes in both shoulders with loss of subacromial space indicating possible rotator cuff arthropathies. Lucency in the proximal right humerus probably represents benign bones cyst. No change since prior study.  IMPRESSION: Cardiac enlargement with mild increased pulmonary vascular congestion, developing edema in the lung bases, and small bilateral pleural effusions. Additional chronic findings are unchanged.   Electronically Signed   By: Burman Nieves M.D.   On: 03/27/2015 04:01    ASSESSMENT AND PLAN:   1. Acute on chronic respiratory failure with hypoxia secondary to combination of COPD exacerbation, pulmonary vascular congestion and possible pneumonia.   Now on nasal canula oxygen. 2. COPD exacerbation secondary to likely health care associated pneumonia.   On Iv steroids, Nebs, Vanc+ zosyn. Blood cx negative. 3. Sepsis-UTI & -healthcare associated pneumonia.   Broad spectrum Abx- cx sent. Urine cx growing Gr  neg  rods. 4. Mildly elevated troponin. No history of cardiac disease in the past, no chest pain. Likely demand ischemia,    Due to infection. Appreciated cardiology help.  On telemetry, continue O2 supplementation   continue aspirin, beta blocker 5. Hypertension, stable on home medications. Continue same. 6. Dementia, stable. Continue supportive care 7. GAD/depression, stable on home medications. Continue same. 8. History of PE/DVT, status post IVC filter placement. Patient on Coumadin.     INR is high- hold now- Pharmacy to adjust dose.     Will give oral vitamin K one dose.   All the records are reviewed and case discussed with Care Management/Social Workerr. Management plans discussed with the patient, family and they are in agreement.  CODE STATUS: DNR  TOTAL TIME TAKING CARE OF THIS PATIENT: 35  minutes.   More than 50% of the visit was spent in counseling/coordination of care i spoke to her daughter on phone.  POSSIBLE D/C IN 2-3  DAYS, DEPENDING ON CLINICAL CONDITION.   Altamese DillingVACHHANI, Aadarsh Cozort M.D on 03/28/2015   Between 7am to 6pm - Pager - [phone]  After 6pm go to [email or web address] - password [email or web address] [facility]  [facility]  Office  [phone]  CC: Primary care physician; PROVIDER NOT IN SYSTEM

## 2015-03-28 NOTE — Progress Notes (Signed)
PT Cancellation Note  Patient Details Name: Holly Drake E Armbrust MRN: [id number] DOB: 08/06/1924   Cancelled Treatment:    Reason Eval/Treat Not Completed: Medical issues which prohibited therapy. Patient currently with an INR of 7.11. Per hospital policy, above 5 is strict bedrest orders. PT will defer mobility evaluation until patient is medically appropriate. PT will continue to monitor and follow.   Kerin RansomPatrick A McNamara, PT, DPT    03/28/2015, 11:28 AM

## 2015-03-29 LAB — CBC
HCT: 30.7 % — ABNORMAL LOW (ref 35.0–47.0)
Hemoglobin: 10.1 g/dL — ABNORMAL LOW (ref 12.0–16.0)
MCH: 29.5 pg (ref 26.0–34.0)
MCHC: 32.9 g/dL (ref 32.0–36.0)
MCV: 89.7 fL (ref 80.0–100.0)
Platelets: 479 10*3/uL — ABNORMAL HIGH (ref 150–440)
RBC: 3.42 MIL/uL — ABNORMAL LOW (ref 3.80–5.20)
RDW: 14.6 % — ABNORMAL HIGH (ref 11.5–14.5)
WBC: 13.3 10*3/uL — AB (ref 3.6–11.0)

## 2015-03-29 LAB — BASIC METABOLIC PANEL
ANION GAP: 9 (ref 5–15)
BUN: 10 mg/dL (ref 6–20)
CALCIUM: 9.1 mg/dL (ref 8.9–10.3)
CHLORIDE: 91 mmol/L — AB (ref 101–111)
CO2: 34 mmol/L — ABNORMAL HIGH (ref 22–32)
CREATININE: 0.39 mg/dL — AB (ref 0.44–1.00)
GFR calc Af Amer: >60 mL/min (ref >60)
GFR calc non Af Amer: >60 mL/min (ref >60)
Glucose, Bld: 161 mg/dL — ABNORMAL HIGH (ref 65–99)
POTASSIUM: 4.6 mmol/L (ref 3.5–5.1)
SODIUM: 134 mmol/L — AB (ref 135–145)

## 2015-03-29 LAB — PROTIME-INR
INR: 1.89
Prothrombin Time: 21.9 seconds — ABNORMAL HIGH (ref 11.4–15.0)

## 2015-03-29 LAB — URINE CULTURE
Culture: >=100000
Special Requests: NORMAL

## 2015-03-29 MED ORDER — WARFARIN SODIUM 1 MG PO TABS: 1.5000 mg | ORAL_TABLET | Freq: Every day | ORAL | Status: DC

## 2015-03-29 MED ORDER — WARFARIN SODIUM 1 MG PO TABS: 2.0000 mg | ORAL_TABLET | Freq: Every day | ORAL | Status: DC

## 2015-03-29 MED ORDER — FUROSEMIDE 20 MG PO TABS
20.0000 mg | ORAL_TABLET | Freq: Every day | ORAL | Status: DC
  Administered 2015-03-30: 20 mg via ORAL
  Filled 2015-03-29: qty 1

## 2015-03-29 MED ORDER — WARFARIN - PHARMACIST DOSING INPATIENT: Freq: Every day | Status: DC

## 2015-03-29 MED ORDER — WARFARIN SODIUM 1 MG PO TABS
2.0000 mg | ORAL_TABLET | Freq: Every day | ORAL | Status: DC
  Administered 2015-03-29: 2 mg via ORAL
  Filled 2015-03-29: qty 2

## 2015-03-29 MED ORDER — CEPHALEXIN 500 MG PO CAPS
500.0000 mg | ORAL_CAPSULE | Freq: Two times a day (BID) | ORAL | Status: DC
  Administered 2015-03-29 – 2015-03-30 (×3): 500 mg via ORAL
  Filled 2015-03-29 (×3): qty 1

## 2015-03-29 NOTE — Progress Notes (Signed)
[facility] Physicians - [place] at [facility]   PATIENT NAME: Holly Drake    MR#:  [id number]  DATE OF BIRTH:  June 17, 1924  SUBJECTIVE:  CHIEF COMPLAINT:   Chief Complaint  Patient presents with  . Respiratory Distress    She is not in distress, but have dementia. On 3-4 ltr oxygen. Her baseline is 2 ltr.  REVIEW OF SYSTEMS:  Pt have dementia,she is alert , but not able to give ROS.  ROS  DRUG ALLERGIES:  No Known Allergies  VITALS:  Blood pressure 133/67, pulse 78, temperature 98.1 F (36.7 C), temperature source Oral, resp. rate 17, height  (1.6 m), weight 62.687 kg (138 lb 3.2 oz), SpO2 94 %.  PHYSICAL EXAMINATION:  GENERAL:  81 y.o.-year-old patient lying in the bed with no acute distress.  EYES: Pupils equal, round, reactive to light and accommodation. No scleral icterus. Extraocular muscles intact.  HEENT: Head atraumatic, normocephalic. Oropharynx and nasopharynx clear.  NECK:  Supple, no jugular venous distention. No thyroid enlargement, no tenderness.  LUNGS: Normal breath sounds bilaterally, mild wheezing,Mild crepitation. No use of accessory muscles of respiration.  CARDIOVASCULAR: S1, S2 normal. No murmurs, rubs, or gallops.  ABDOMEN: Soft, nontender, nondistended. Bowel sounds present. No organomegaly or mass.  EXTREMITIES: No pedal edema, cyanosis, or clubbing.  NEUROLOGIC: Cranial nerves II through XII are intact. Muscle strength 4/5 in all extremities.  Gait not checked.  PSYCHIATRIC: The patient is alert but not oriented. SKIN: No obvious rash, lesion, or ulcer.   Physical Exam LABORATORY PANEL:   CBC  Recent Labs Lab 03/29/15 0446  WBC 13.3*  HGB 10.1*  HCT 30.7*  PLT 479*   ------------------------------------------------------------------------------------------------------------------  Chemistries   Recent Labs Lab 03/27/15 0348 03/29/15 0446  NA 136 134*  K 3.7 4.6  CL 91* 91*  CO2 31 34*  GLUCOSE 147* 161*  BUN  8 10  CREATININE 0.51 0.39*  CALCIUM 8.7* 9.1  AST 28  --   ALT 17  --   ALKPHOS 147*  --   BILITOT 0.8  --    ------------------------------------------------------------------------------------------------------------------  Cardiac Enzymes  Recent Labs Lab 03/27/15 0348  TROPONINI 0.08*   ------------------------------------------------------------------------------------------------------------------  RADIOLOGY:  No results found.  ASSESSMENT AND PLAN:   1. Acute on chronic respiratory failure with hypoxia secondary to combination of COPD exacerbation, pulmonary vascular congestion and possible pneumonia.   Now on nasal canula oxygen. Added lasix . 2. COPD exacerbation secondary to likely health care associated pneumonia.   On Iv steroids, Nebs, Vanc+ zosyn. Blood cx negative. 3. Sepsis-UTI & -healthcare associated pneumonia.   Broad spectrum Abx- cx sent. Urine cx growing Gr neg rods.- E coli sensitive to cefalosporin     Abx swich to keflex to cover for both. 4. Mildly elevated troponin. No history of cardiac disease in the past, no chest pain. Likely demand ischemia,    Due to infection. Appreciated cardiology help.  On telemetry, continue O2 supplementation   continue aspirin, beta blocker 5. Hypertension, stable on home medications. Continue same. 6. Dementia, stable. Continue supportive care 7. GAD/depression, stable on home medications. Continue same. 8. History of PE/DVT, status post IVC filter placement. Patient on Coumadin.     INR is high- hold now- Pharmacy to adjust dose.     given oral vitamin K one dose. Now 1.8- restart coumadin.  Oxygen requirement is still high today- than her baseline- may d/c tomorrow.  All the records are reviewed and case discussed with Care Management/Social  Workerr. Management plans discussed with the patient, family and they are in agreement.  CODE STATUS: DNR  TOTAL TIME TAKING CARE OF THIS PATIENT: 35  minutes.   More  than 50% of the visit was spent in counseling/coordination of care i spoke to her daughter on phone.  POSSIBLE D/C IN 2-3  DAYS, DEPENDING ON CLINICAL CONDITION.   Altamese DillingVACHHANI, Alishba [place] M.D on 03/29/2015   Between 7am to 6pm - Pager - [phone]  After 6pm go to [email or web address] - password [email or web address] [facility]  [place] [place] [facility]  Office  [phone]  CC: Primary care physician; PROVIDER NOT IN SYSTEM

## 2015-03-29 NOTE — Progress Notes (Signed)
Patient stable overnight. Confused. 4L02. Lungs diminished with wheezes. VSS. Incontinent at times. SR see charting for assessments.

## 2015-03-29 NOTE — Evaluation (Signed)
Physical Therapy Evaluation Patient Details Name: Holly Drake MRN: [id number] DOB: 04/23/1924 Today's Date: 03/29/2015   History of Present Illness  Patient is an 81 y/o female with advanced dementia that comes from [facility]. Patient has a history of IVC filter placement, and was recently admitted to [facility] on 02/16/2015 for a hip fracture with IM nail, and discharged to rehab prior to returning to [facility]. Patient in on this admiussion for UTI, sepsis, acute respiratory failure.   Clinical Impression  Patient is an 81 y/o female with advanced dementia, that was pleasant this AM though she had removed her [place] and gone down to 67% O2. PT re-attempted this afternoon, and patient was very verbally combative and agitated and showed immediate loss of all conversation. Physically she appears at her mobility baseline, which appears to be the ability to transfer from bed to chair. Patient is unable to follow directions consistently, or really at all and becomes very agitated with any and all mobility requests. Patient is not appropriate for any kind of skilled rehab currently given her poor command following and agitation. Should this resolve, patient would benefit from rehab services to increase her bed mobility skills and OOB transfers.     Follow Up Recommendations No PT follow up    Equipment Recommendations       Recommendations for Other Services       Precautions / Restrictions Restrictions Weight Bearing Restrictions: Yes Other Position/Activity Restrictions: WBAT       Mobility  Bed Mobility Overal bed mobility: Needs Assistance Bed Mobility: Supine to Sit;Sit to Supine     Supine to sit: Mod assist Sit to supine: Max assist   General bed mobility comments: Patient is unable to move her legs adequately to bring them over the edge, hence the physical assistance from PT.   Transfers Overall transfer level: Needs assistance Equipment used: Rolling walker (2  wheeled) Transfers: Sit to/from Stand Sit to Stand: Mod assist;+2 safety/equipment         General transfer comment: Patient is unaware of her surroundings and while she is able to provide effort with standing she is unable to provide any additional mobility as she does not follow commands.   Ambulation/Gait                Stairs            Wheelchair Mobility    Modified Rankin (Stroke Patients Only)       Balance Overall balance assessment: Needs assistance   Sitting balance-Leahy Scale: Fair Sitting balance - Comments: Patient initially falls to her right several times, but can hold herself upright once she is settled.  Postural control: Posterior lean;Right lateral lean Standing balance support: Bilateral upper extremity supported Standing balance-Leahy Scale: Poor Standing balance comment: Patient requires 1-2 person assistance with balance secondary to LE weakness and poor awareness of her surroundings. She was not appropriate for dynamic mobility.                              Pertinent Vitals/Pain Pain Assessment:  (Patient states she has pain today, but unable to rate secondary to dementia. )    Home Living Family/patient expects to be discharged to:: Assisted living                      Prior Function Level of Independence: Needs assistance   Gait / Transfers Assistance Needed: Per  previous PT notes, patient is essentially w/c bound at baseline.  ADL's / Homemaking Assistance Needed: Unable to determine secondary to agitation.         Hand Dominance        Extremity/Trunk Assessment   Upper Extremity Assessment: Difficult to assess due to impaired cognition           Lower Extremity Assessment: Difficult to assess due to impaired cognition         Communication   Communication: HOH (Cognitive deficits. )  Cognition Arousal/Alertness: Awake/alert Behavior During Therapy: Agitated Overall Cognitive Status:  History of cognitive impairments - at baseline       Memory: Decreased recall of precautions;Decreased short-term memory              General Comments      Exercises        Assessment/Plan    PT Assessment All further PT needs can be met in the next venue of care  PT Diagnosis Difficulty walking   PT Problem List Decreased mobility  PT Treatment Interventions     PT Goals (Current goals can be found in the Care Plan section) Acute Rehab PT Goals Patient Stated Goal: Unable to decide goals.  PT Goal Formulation: All assessment and education complete, DC therapy    Frequency     Barriers to discharge        Co-evaluation               End of Session Equipment Utilized During Treatment: Gait belt;Oxygen Activity Tolerance: Treatment limited secondary to agitation Patient left: in bed;with call bell/phone within reach;with bed alarm set Nurse Communication: Mobility status         Time: 1400-1420 PT Time Calculation (min) (ACUTE ONLY): 20 min   Charges:   PT Evaluation $Initial PT Evaluation Tier I: 1 Procedure     PT G Codes:       Kerman Passey, PT, DPT    03/29/2015, 3:29 PM

## 2015-03-29 NOTE — Clinical Social Work Note (Signed)
[facility]  Facility does have portable tank to transport.  CSW will continue to follow

## 2015-03-29 NOTE — Progress Notes (Signed)
PT Cancellation Note  Patient Details Name: Holly Drake MRN: [id number] DOB: 07/31/1924   Cancelled Treatment:    Reason Eval/Treat Not Completed: Medical issues which prohibited therapy. PT entered the room and took oxygen level of patient. Patient had apparently removed nasal cannula on 4L of O2. Patient displayed O2 level of 67%. PT alerted RN staff and replaced [place]. Patient increased to 89% within several minutes. PT will defer mobility evaluation given her recent hypoxic state of unknown time until patient has sufficiently recovered. PT will continue to monitor and re-attempt when appropriate.   Kerin RansomPatrick A McNamara, PT, DPT    03/29/2015, 10:49 AM

## 2015-03-29 NOTE — Care Management Important Message (Signed)
Important Message  Patient Details  Name: Holly Drake MRN: [id number] Date of Birth: 02/05/1924   Medicare Important Message Given:  Yes-second notification given    Olegario MessierKathy A Allmond 03/29/2015, 9:44 AM

## 2015-03-29 NOTE — Progress Notes (Signed)
Patient remains confused, tries to get out of bed at time and takes oxygen off, able to be redirected.  VSS, SR on cardiac monitor with PACs.  Currently in bed in no apparent distress.  See flowsheets for further details.

## 2015-03-29 NOTE — Care Management (Signed)
Attending states that anticipates patient will discharge within next 24 hours. Discussed need for home health nursing order.  Patient will require her portable 02 [facility]

## 2015-03-29 NOTE — Progress Notes (Addendum)
ANTICOAGULATION CONSULT NOTE - Initial Consult  Pharmacy Consult for warfarin dosing  Indication: Hx of dvt/pe  No Known Allergies  Patient Measurements: Height: 5\' 3"  (160 cm) Weight: 138 lb 3.2 oz (62.687 kg) IBW/kg (Calculated) : 52.4 Heparin Dosing Weight:   Vital Signs: Temp: 98.1 F (36.7 C) (07/14 0442) Temp Source: Oral (07/14 0442) BP: 152/78 mmHg (07/14 0442) Pulse Rate: 74 (07/14 0442)  Labs:  Recent Labs  03/27/15 0348 03/27/15 0925 03/28/15 0348 03/29/15 0446  HGB 10.0*  --  8.8* 10.1*  HCT 31.0*  --  27.7* 30.7*  PLT 389  --  384 479*  LABPROT  --  57.7* 60.6* 21.9*  INR  --  6.67* 7.11* 1.89  CREATININE 0.51  --   --  0.39*  TROPONINI 0.08*  --   --   --     Estimated Creatinine Clearance: 39.4 mL/min (by C-G formula based on Cr of 0.39).   Medical History: Past Medical History  Diagnosis Date  . COPD (chronic obstructive pulmonary disease)   . Dementia   . OA (osteoarthritis)   . Hypertension     Medications:  Scheduled:  . amLODipine  10 mg Oral Daily  . aspirin EC  81 mg Oral Daily  . calcium carbonate  200 mg of elemental calcium Oral Q supper  . citalopram  20 mg Oral QHS  . docusate sodium  100 mg Oral BID  . furosemide  20 mg Oral QODAY  . losartan  100 mg Oral Daily  . methylPREDNISolone (SOLU-MEDROL) injection  60 mg Intravenous Q12H  . metoprolol tartrate  75 mg Oral BID  . mometasone-formoterol  2 puff Inhalation BID  . pantoprazole  40 mg Oral QAC breakfast  . piperacillin-tazobactam  3.375 g Intravenous 3 times per day  . potassium chloride SA  20 mEq Oral BID  . sodium chloride  3 mL Intravenous Q12H  . vancomycin  1,000 mg Intravenous Q24H    Assessment: Patients INR was supratherapeutic x 2 days. Most likely due to interaction with anitbiotics. Patient was given one dose of vit k 5mg  po yesterday. INR now slightly subtherapeutic. Patient appetite is poor. Goal of Therapy:  INR 2-3 Monitor platelets by  anticoagulation protocol: Yes   Plan:  Restart home dose due to drug interaction and diet changes. Patient may need higher dose tomorrow due to vit K. Recheck INR in AM   Melissa D Maccia, Pharm.D Clinical Pharmacist  03/29/2015,7:47 AM

## 2015-03-30 LAB — PROTIME-INR
INR: 1.23
Prothrombin Time: 15.7 seconds — ABNORMAL HIGH (ref 11.4–15.0)

## 2015-03-30 MED ORDER — FUROSEMIDE 20 MG PO TABS: 20.0000 mg | ORAL_TABLET | Freq: Every day | ORAL | Status: AC

## 2015-03-30 MED ORDER — WARFARIN SODIUM 4 MG PO TABS: 4.0000 mg | ORAL_TABLET | Freq: Once | ORAL | Status: DC

## 2015-03-30 MED ORDER — ASPIRIN 81 MG PO TBEC: 81.0000 mg | DELAYED_RELEASE_TABLET | Freq: Every day | ORAL | Status: AC

## 2015-03-30 MED ORDER — CEPHALEXIN 500 MG PO CAPS: 500.0000 mg | ORAL_CAPSULE | Freq: Two times a day (BID) | ORAL | Status: DC

## 2015-03-30 MED ORDER — WARFARIN SODIUM 3 MG PO TABS: 3.0000 mg | ORAL_TABLET | Freq: Every day | ORAL | Status: AC

## 2015-03-30 MED ORDER — HYDROCODONE-ACETAMINOPHEN 5-325 MG PO TABS: 1.0000 | ORAL_TABLET | Freq: Four times a day (QID) | ORAL | Status: AC | PRN

## 2015-03-30 NOTE — Progress Notes (Signed)
Discharge: Pt d/c from room via wheelchair, Family member, daughter with the pt. Discharge instructions given to the patient and family members.  No questions from pt, reintegrated to the pt to call or go to the ED for chest discomfort. Pt dressed in street clothes and left with discharge papers and prescriptions in hand. IV d/ced, tele removed and no complaints of pain or discomfort.  [facility] and talked to [place] .  Pt left with her own oxygen tank.

## 2015-03-30 NOTE — Discharge Summary (Signed)
[facility] - [place] at [facility]   PATIENT NAME: Holly Drake    MR#:  [id number]  DATE OF BIRTH:  10/14/1923  DATE OF ADMISSION:  03/27/2015 ADMITTING PHYSICIAN: Crissie Figures, MD  DATE OF DISCHARGE: 03/30/2015  PRIMARY CARE PHYSICIAN: PROVIDER NOT IN SYSTEM    ADMISSION DIAGNOSIS:  Complicated UTI (urinary tract infection) [N39.0] Sepsis, due to unspecified organism [A41.9] Acute respiratory failure with hypoxemia [J96.01]  DISCHARGE DIAGNOSIS:  Principal Problem:   Acute on chronic respiratory failure with hypoxia Active Problems:   COPD exacerbation   Sepsis   UTI (urinary tract infection)   Elevated troponin   HTN (hypertension), benign   Coagulopathy- high INR.  SECONDARY DIAGNOSIS:   Past Medical History  Diagnosis Date  . COPD (chronic obstructive pulmonary disease)   . Dementia   . OA (osteoarthritis)   . Hypertension     HOSPITAL COURSE:   1. Acute on chronic respiratory failure with hypoxia secondary to combination of COPD exacerbation, pulmonary vascular congestion and possible pneumonia.  Now on nasal canula oxygen. Added lasix . Came back to her baseline 2-3 ltr oxygen. 2. COPD exacerbation secondary to likely health care associated pneumonia.  On Iv steroids, Nebs, Vanc+ zosyn. Blood cx negative. Abx changed to cephalosporin. 3. Sepsis-UTI & -healthcare associated pneumonia.  Broad spectrum Abx- cx sent. Urine cx growing Gr neg rods.- E coli sensitive to cefalosporin  Abx swich to keflex to cover for both. 4. Mildly elevated troponin. No history of cardiac disease in the past, no chest pain. Likely demand ischemia, Due to infection. Appreciated cardiology help. On telemetry, continue O2 supplementation  continue aspirin, beta blocker 5. Hypertension, stable on home medications. Continue same. 6. Dementia, stable. Continue supportive care 7. GAD/depression, stable on home medications. Continue same. 8. History  of PE/DVT, status post IVC filter placement. Patient on Coumadin.   INR is high- hold now- Pharmacy to adjust dose.  given oral vitamin K one dose. INR 1.8- restarted coumadin.     Need to check INR in 4-5 days.   DISCHARGE CONDITIONS:   Stable.  CONSULTS OBTAINED:  Treatment Team:  Dalia Heading, MD  DRUG ALLERGIES:  No Known Allergies  DISCHARGE MEDICATIONS:   Current Discharge Medication List    START taking these medications   Details  aspirin EC 81 MG EC tablet Take 1 tablet (81 mg total) by mouth daily. Qty: 30 tablet, Refills: 0    cephALEXin (KEFLEX) 500 MG capsule Take 1 capsule (500 mg total) by mouth every 12 (twelve) hours. Qty: 8 capsule, Refills: 0      CONTINUE these medications which have CHANGED   Details  furosemide (LASIX) 20 MG tablet Take 1 tablet (20 mg total) by mouth daily. Qty: 30 tablet, Refills: 0    !! HYDROcodone-acetaminophen (NORCO/VICODIN) 5-325 MG per tablet Take 1 tablet by mouth every 6 (six) hours as needed for moderate pain. Qty: 15 tablet, Refills: 0    warfarin (COUMADIN) 3 MG tablet Take 1 tablet (3 mg total) by mouth daily. Qty: 20 tablet, Refills: 0     !! - Potential duplicate medications found. Please discuss with provider.    CONTINUE these medications which have NOT CHANGED   Details  acetaminophen (TYLENOL) 325 MG tablet Take 2 tablets (650 mg total) by mouth every 6 (six) hours as needed for mild pain (or Fever >/= 101).    amLODipine (NORVASC) 10 MG tablet Take 10 mg by mouth daily.  budesonide (PULMICORT) 0.5 MG/2ML nebulizer solution Take 2 mLs (0.5 mg total) by nebulization 2 (two) times daily. Qty: 50 mL, Refills: 0    calcium carbonate (OS-CAL) 600 MG TABS tablet Take 600 mg by mouth daily.    chlorhexidine (PERIDEX) 0.12 % solution Use as directed 15 mLs in the mouth or throat 3 (three) times daily.    cholecalciferol (VITAMIN D) 1000 UNITS tablet Take 1,000-2,000 Units by mouth daily. Pt takes two  tablets on Tuesday and Friday.   Pt takes one tablet on Sunday, Monday, Wednesday, Thursday, and Saturday.    citalopram (CELEXA) 20 MG tablet Take 20 mg by mouth at bedtime.    docusate sodium (COLACE) 100 MG capsule Take 1 capsule (100 mg total) by mouth 2 (two) times daily. Qty: 10 capsule, Refills: 0    Fluticasone-Salmeterol (ADVAIR) 250-50 MCG/DOSE AEPB Inhale 1 puff into the lungs 2 (two) times daily.    !! HYDROcodone-acetaminophen (NORCO/VICODIN) 5-325 MG per tablet Take 1 tablet by mouth every 6 (six) hours as needed for moderate pain. Qty: 20 tablet, Refills: 0    LORazepam (ATIVAN) 0.5 MG tablet Take 1 tablet (0.5 mg total) by mouth every 6 (six) hours as needed for anxiety. Qty: 20 tablet, Refills: 0    losartan (COZAAR) 100 MG tablet Take 100 mg by mouth daily.    magnesium hydroxide (MILK OF MAGNESIA) 400 MG/5ML suspension Take 30 mLs by mouth daily as needed for mild constipation. Qty: 360 mL, Refills: 0    methocarbamol (ROBAXIN) 500 MG tablet Take 1 tablet (500 mg total) by mouth every 6 (six) hours as needed for muscle spasms. Qty: 20 tablet, Refills: 0    metoprolol tartrate (LOPRESSOR) 25 MG tablet Take 75 mg by mouth 2 (two) times daily.    mometasone-formoterol (DULERA) 200-5 MCG/ACT AERO Inhale 2 puffs into the lungs 2 (two) times daily. Qty: 1 Inhaler, Refills: 0    Multiple Vitamins-Minerals (PRESERVISION AREDS 2) CAPS Take 1 capsule by mouth daily.    ondansetron (ZOFRAN) 4 MG tablet Take 1 tablet (4 mg total) by mouth every 6 (six) hours as needed for nausea. Qty: 20 tablet, Refills: 0    pantoprazole (PROTONIX) 40 MG tablet Take 40 mg by mouth daily.    potassium & sodium phosphates (PHOS-NAK) 280-160-250 MG PACK Take 1 packet by mouth 4 (four) times daily -  before meals and at bedtime. Qty: 15 packet, Refills: 0    potassium chloride SA (K-DUR,KLOR-CON) 20 MEQ tablet Take 20 mEq by mouth 2 (two) times daily.    predniSONE (DELTASONE) 20 MG  tablet Take 20 mg by mouth daily with breakfast.    sodium fluoride (PREVIDENT 5000 PLUS) 1.1 % CREA dental cream Place 1 application onto teeth 3 (three) times daily after meals.     sulfaSALAzine (AZULFIDINE) 500 MG tablet Take 500 mg by mouth 2 (two) times daily with a meal.    tiotropium (SPIRIVA) 18 MCG inhalation capsule Place 1 capsule (18 mcg total) into inhaler and inhale daily. Qty: 30 capsule, Refills: 12     !! - Potential duplicate medications found. Please discuss with provider.    STOP taking these medications     carvedilol (COREG) 3.125 MG tablet      menthol-cetylpyridinium (CEPACOL) 3 MG lozenge          DISCHARGE INSTRUCTIONS:    FOLLOW INR IN 4-5 DAYS TO ADJUST DOSE OF COUMADIN. FOLLOW WITH PMD IN 1-2 WEEKS.  If you experience worsening of your  admission symptoms, develop shortness of breath, life threatening emergency, suicidal or homicidal thoughts you must seek medical attention immediately by calling 911 or calling your MD immediately  if symptoms less severe.  You Must read complete instructions/literature along with all the possible adverse reactions/side effects for all the Medicines you take and that have been prescribed to you. Take any new Medicines after you have completely understood and accept all the possible adverse reactions/side effects.   Please note  You were cared for by a hospitalist during your hospital stay. If you have any questions about your discharge medications or the care you received while you were in the hospital after you are discharged, you can call the unit and asked to speak with the hospitalist on call if the hospitalist that took care of you is not available. Once you are discharged, your primary care physician will handle any further medical issues. Please note that NO REFILLS for any discharge medications will be authorized once you are discharged, as it is imperative that you return to your primary care physician (or  establish a relationship with a primary care physician if you do not have one) for your aftercare needs so that they can reassess your need for medications and monitor your lab values.    Today   CHIEF COMPLAINT:   Chief Complaint  Patient presents with  . Respiratory Distress    HISTORY OF PRESENT ILLNESS:  Holly Drake  is a 81 y.o. female resident of [facility] was brought in by EMS with the complaints of worsening shortness of breath of one-week duration with acute worsening earlier today with hypoxia and a temperature of 10 68F. In the emergency room on arrival patient was noted to be in severe respiratory distress, was on nonrebreather mask and O2 saturations were in the upper 70s. Patient has a DNR/DNI order. Patient was evaluated by the ED physician and workup revealed white blood cell count of 16.0, chest x-ray with mildly increased pulmonary vascular congestion, developing any mild lung bases, bilateral small pleural effusions. Urinalysis remarkable for significant UTI. EKG sinus tachycardia with ventricular rate of 1 24 bpm. Troponin mildly elevated at 0.08. Patient was treated with vigorous nebs, continued on oxygen supplementation with a nonrebreather mask and transitioned to nasal cannula once O2 saturations improved, IV Solu-Medrol. After obtaining blood and urine cultures patient was started on IV vancomycin and Zosyn. According to the patient's daughter and son-in-law work with the patient at this time patient underwent right hip replacement following a fracture about 5/2 weeks ago and was later discharged to rehabilitation until one week ago when she was transferred to assisted living place. Ever since he has been in assisted living place she has been having shortness of breath with wheezing for the past 1 week. No history of any chest pain, nausea, vomiting, diarrhea, abdominal pain. Patient is currently maintained on oxygen supplementation per nasal cannula  and comfortably resting in bed.   VITAL SIGNS:  Blood pressure 141/65, pulse 69, temperature 97.4 F (36.3 C), temperature source Oral, resp. rate 18, height 5\' 3"  (1.6 m), weight 60.056 kg (132 lb 6.4 oz), SpO2 97 %.  I/O:   Intake/Output Summary (Last 24 hours) at 03/30/15 1159 Last data filed at 03/30/15 1055  Gross per 24 hour  Intake    120 ml  Output    300 ml  Net   -180 ml    PHYSICAL EXAMINATION:   GENERAL: 81 y.o.-year-old patient lying in the  bed with no acute distress.  EYES: Pupils equal, round, reactive to light and accommodation. No scleral icterus. Extraocular muscles intact.  HEENT: Head atraumatic, normocephalic. Oropharynx and nasopharynx clear.  NECK: Supple, no jugular venous distention. No thyroid enlargement, no tenderness.  LUNGS: Normal breath sounds bilaterally, mild wheezing,Mild crepitation. No use of accessory muscles of respiration.  CARDIOVASCULAR: S1, S2 normal. No murmurs, rubs, or gallops.  ABDOMEN: Soft, nontender, nondistended. Bowel sounds present. No organomegaly or mass.  EXTREMITIES: No pedal edema, cyanosis, or clubbing.  NEUROLOGIC: Cranial nerves II through XII are intact. Muscle strength 4/5 in all extremities. Gait not checked.  PSYCHIATRIC: The patient is alert but not oriented. SKIN: No obvious rash, lesion, or ulcer.   DATA REVIEW:   CBC  Recent Labs Lab 03/29/15 0446  WBC 13.3*  HGB 10.1*  HCT 30.7*  PLT 479*    Chemistries   Recent Labs Lab 03/27/15 0348 03/29/15 0446  NA 136 134*  K 3.7 4.6  CL 91* 91*  CO2 31 34*  GLUCOSE 147* 161*  BUN 8 10  CREATININE 0.51 0.39*  CALCIUM 8.7* 9.1  AST 28  --   ALT 17  --   ALKPHOS 147*  --   BILITOT 0.8  --     Cardiac Enzymes  Recent Labs Lab 03/27/15 0348  TROPONINI 0.08*    Microbiology Results  Results for orders placed or performed during the hospital encounter of 03/27/15  Blood Culture (routine x 2)     Status: None (Preliminary result)    Collection Time: 03/27/15  3:46 AM  Result Value Ref Range Status   Specimen Description BLOOD  Final   Special Requests NONE  Final   Culture  Setup Time   Final    GRAM POSITIVE COCCI ANAEROBIC BOTTLE ONLY CRITICAL RESULT CALLED TO, READ BACK BY AND VERIFIED WITH: ADRIAN WHITE AT 2256 03/27/15 SDR CONFIRM TSH    Culture   Final    COAGULASE NEGATIVE STAPHYLOCOCCUS ANAEROBIC BOTTLE ONLY POSSIBLE CONTAMINATION WITH SKIN FLORA    Report Status PENDING  Incomplete  Blood Culture (routine x 2)     Status: None (Preliminary result)   Collection Time: 03/27/15  3:48 AM  Result Value Ref Range Status   Specimen Description BLOOD  Final   Special Requests NONE  Final   Culture NO GROWTH 3 DAYS  Final   Report Status PENDING  Incomplete  Urine culture     Status: None   Collection Time: 03/27/15  4:10 AM  Result Value Ref Range Status   Specimen Description URINE, RANDOM  Final   Special Requests Normal  Final   Culture >=100,000 COLONIES/mL ESCHERICHIA COLI  Final   Report Status 03/29/2015 FINAL  Final   Organism ID, Bacteria ESCHERICHIA COLI  Final      Susceptibility   Escherichia coli - MIC*    AMPICILLIN >=32 RESISTANT Resistant     CEFTAZIDIME <=1 SENSITIVE Sensitive     CEFAZOLIN <=4 SENSITIVE Sensitive     CEFTRIAXONE <=1 SENSITIVE Sensitive     CIPROFLOXACIN >=4 RESISTANT Resistant     GENTAMICIN <=1 SENSITIVE Sensitive     IMIPENEM <=0.25 SENSITIVE Sensitive     TRIMETH/SULFA >=320 RESISTANT Resistant     NITROFURANTOIN Value in next row Sensitive      SENSITIVE<=16    PIP/TAZO Value in next row Sensitive      SENSITIVE<=4    LEVOFLOXACIN Value in next row Resistant      RESISTANT>=8  ERTAPENEM Value in next row Sensitive      SENSITIVE<=0.5    * >=100,000 COLONIES/mL ESCHERICHIA COLI    RADIOLOGY:  No results found.    Management plans discussed with the patient, family and they are in agreement.  CODE STATUS:     Code Status Orders         Start     Ordered   04/15/2015 0757  Do not attempt resuscitation (DNR)   Continuous    Question Answer Comment  In the event of cardiac or respiratory ARREST Do not call a "code blue"   In the event of cardiac or respiratory ARREST Do not perform Intubation, CPR, defibrillation or ACLS   In the event of cardiac or respiratory ARREST Use medication by any route, position, wound care, and other measures to relive pain and suffering. May use oxygen, suction and manual treatment of airway obstruction as needed for comfort.   Comments RN may pronounce death      April 15, 2015 0757    Advance Directive Documentation        Most Recent Value   Type of Advance Directive  Out of facility DNR (pink MOST or yellow form)   Pre-existing out of facility DNR order (yellow form or pink MOST form)  Yellow form placed in chart (order not valid for inpatient use), Physician notified to receive inpatient order   "MOST" Form in Place?        TOTAL TIME TAKING CARE OF THIS PATIENT: 35 minutes.    Altamese Dilling M.D on 03/30/2015 at 11:59 AM  Between 7am to 6pm - Pager - [phone]  After 6pm go to [email or web address] - password [email or web address] [facility]  [facility]  Office  [phone]  CC: Primary care physician; PROVIDER NOT IN SYSTEM

## 2015-03-30 NOTE — Progress Notes (Signed)
Pt alert and oriented x3, no complaints of pain or discomfort.  Bed in low position, call bell within reach.  Bed alarms on and functioning.  Assessment done and charted.  Will continue to monitor and do hourly rounding throughout the shift 

## 2015-03-30 NOTE — Progress Notes (Signed)
Alert to self, patient very pleasant this am. Lungs diminished in bases, no respiratory distress noted. Currently on 02 at 4L. Iv saline locked. Telemetry reading NSR 75. Call bell within reach, side rails up x2. Bed alarm in use will continue to monitor [facility]

## 2015-03-30 NOTE — Clinical Social Work Note (Signed)
[facility] and RN that pt would DC today via her daughter back to ALF.  Labs and CXR are in cluded in the DC packet.  CSW signing off unless further needs arise

## 2015-03-30 NOTE — Progress Notes (Signed)
ANTICOAGULATION CONSULT NOTE - Initial Consult  Pharmacy Consult for warfarin dosing  Indication: Hx of dvt/pe  No Known Allergies  Patient Measurements: Height: 5\' 3"  (160 cm) Weight: 132 lb 6.4 oz (60.056 kg) IBW/kg (Calculated) : 52.4 Heparin Dosing Weight:   Vital Signs: Temp: 97.7 F (36.5 C) (07/15 0509) Temp Source: Oral (07/15 0509) BP: 151/76 mmHg (07/15 0509) Pulse Rate: 82 (07/15 0509)  Labs:  Recent Labs  03/28/15 0348 03/29/15 0446 03/30/15 0412  HGB 8.8* 10.1*  --   HCT 27.7* 30.7*  --   PLT 384 479*  --   LABPROT 60.6* 21.9* 15.7*  INR 7.11* 1.89 1.23  CREATININE  --  0.39*  --     Estimated Creatinine Clearance: 39.4 mL/min (by C-G formula based on Cr of 0.39).   Medical History: Past Medical History  Diagnosis Date  . COPD (chronic obstructive pulmonary disease)   . Dementia   . OA (osteoarthritis)   . Hypertension     Medications:  Scheduled:  . amLODipine  10 mg Oral Daily  . aspirin EC  81 mg Oral Daily  . calcium carbonate  200 mg of elemental calcium Oral Q supper  . cephALEXin  500 mg Oral Q12H  . citalopram  20 mg Oral QHS  . docusate sodium  100 mg Oral BID  . furosemide  20 mg Oral Daily  . losartan  100 mg Oral Daily  . methylPREDNISolone (SOLU-MEDROL) injection  60 mg Intravenous Q12H  . metoprolol tartrate  75 mg Oral BID  . mometasone-formoterol  2 puff Inhalation BID  . pantoprazole  40 mg Oral QAC breakfast  . potassium chloride SA  20 mEq Oral BID  . sodium chloride  3 mL Intravenous Q12H  . warfarin  2 mg Oral q1800  . Warfarin - Pharmacist Dosing Inpatient   Does not apply q1800    Assessment: INR continues to drop, likely due to Vit K dose. INR 1.23.   Goal of Therapy:  INR 2-3 Monitor platelets by anticoagulation protocol: Yes   Plan:  Give 4mg  tonight. Recheck INR in AM  Melissa D Maccia, Pharm.D Clinical Pharmacist  03/30/2015,7:55 AM

## 2015-03-30 NOTE — Care Management (Signed)
[facility]  There were no physical therapy recommendations.  Patient requires home health nurse for continued follow up of her respiratory status.  Attending informed and will enter order for home health nurse and face to face.  [facility]  Informed by [facility] that [facility] that nurse could see patient within 24 hours of discharge.  [facility] nurse liaison will coordinate the referral

## 2015-04-01 LAB — CULTURE, BLOOD (ROUTINE X 2): CULTURE: NO GROWTH

## 2015-04-02 ENCOUNTER — Other Ambulatory Visit
Admission: RE | Admit: 2015-04-02 | Discharge: 2015-04-02 | Disposition: A | Discharge status: Home / Self Care | Payer: Medicare Other | Source: Ambulatory Visit | Attending: Nurse Practitioner | Admitting: Nurse Practitioner

## 2015-04-02 DIAGNOSIS — I4891 Unspecified atrial fibrillation: Secondary | ICD-10-CM | POA: Diagnosis present

## 2015-04-02 LAB — PROTIME-INR
INR: 1.91
Prothrombin Time: 21.7 seconds — ABNORMAL HIGH (ref 11.4–15.0)

## 2015-07-25 ENCOUNTER — Other Ambulatory Visit
Admission: RE | Admit: 2015-07-25 | Discharge: 2015-07-25 | Disposition: A | Discharge status: Home / Self Care | Payer: Medicare Other | Source: Ambulatory Visit | Attending: Nurse Practitioner | Admitting: Nurse Practitioner

## 2015-07-25 DIAGNOSIS — Z029 Encounter for administrative examinations, unspecified: Secondary | ICD-10-CM | POA: Insufficient documentation

## 2015-07-25 LAB — URINALYSIS COMPLETE WITH MICROSCOPIC (ARMC ONLY)
Bilirubin Urine: NEGATIVE
Glucose, UA: NEGATIVE mg/dL
Hgb urine dipstick: NEGATIVE
KETONES UR: NEGATIVE mg/dL
Nitrite: POSITIVE — AB
PROTEIN: NEGATIVE mg/dL
RBC / HPF: NONE SEEN RBC/hpf (ref <3)
Specific Gravity, Urine: 1.02 (ref 1.005–1.030)
pH: 6 (ref 5.0–8.0)

## 2015-07-27 LAB — URINE CULTURE

## 2015-11-22 ENCOUNTER — Emergency Department: Payer: Medicare Other

## 2015-11-22 ENCOUNTER — Inpatient Hospital Stay
Admission: EM | Admit: 2015-11-22 | Discharge: 2015-12-15 | DRG: 871 | Disposition: E | Payer: Medicare Other | Attending: Internal Medicine | Admitting: Internal Medicine

## 2015-11-22 ENCOUNTER — Encounter: Payer: Self-pay | Admitting: Emergency Medicine

## 2015-11-22 DIAGNOSIS — Z87891 Personal history of nicotine dependence: Secondary | ICD-10-CM | POA: Diagnosis not present

## 2015-11-22 DIAGNOSIS — I10 Essential (primary) hypertension: Secondary | ICD-10-CM | POA: Diagnosis present

## 2015-11-22 DIAGNOSIS — Z820 Family history of epilepsy and other diseases of the nervous system: Secondary | ICD-10-CM

## 2015-11-22 DIAGNOSIS — A419 Sepsis, unspecified organism: Principal | ICD-10-CM | POA: Diagnosis present

## 2015-11-22 DIAGNOSIS — M1990 Unspecified osteoarthritis, unspecified site: Secondary | ICD-10-CM | POA: Diagnosis present

## 2015-11-22 DIAGNOSIS — R000 Tachycardia, unspecified: Secondary | ICD-10-CM | POA: Diagnosis present

## 2015-11-22 DIAGNOSIS — Z515 Encounter for palliative care: Secondary | ICD-10-CM | POA: Diagnosis present

## 2015-11-22 DIAGNOSIS — R0902 Hypoxemia: Secondary | ICD-10-CM | POA: Diagnosis present

## 2015-11-22 DIAGNOSIS — F329 Major depressive disorder, single episode, unspecified: Secondary | ICD-10-CM | POA: Diagnosis present

## 2015-11-22 DIAGNOSIS — Z7952 Long term (current) use of systemic steroids: Secondary | ICD-10-CM

## 2015-11-22 DIAGNOSIS — J441 Chronic obstructive pulmonary disease with (acute) exacerbation: Secondary | ICD-10-CM | POA: Diagnosis present

## 2015-11-22 DIAGNOSIS — Z79899 Other long term (current) drug therapy: Secondary | ICD-10-CM | POA: Diagnosis not present

## 2015-11-22 DIAGNOSIS — F0390 Unspecified dementia without behavioral disturbance: Secondary | ICD-10-CM | POA: Diagnosis present

## 2015-11-22 DIAGNOSIS — R0603 Acute respiratory distress: Secondary | ICD-10-CM

## 2015-11-22 DIAGNOSIS — R778 Other specified abnormalities of plasma proteins: Secondary | ICD-10-CM | POA: Diagnosis present

## 2015-11-22 DIAGNOSIS — Z7901 Long term (current) use of anticoagulants: Secondary | ICD-10-CM | POA: Diagnosis not present

## 2015-11-22 DIAGNOSIS — Z66 Do not resuscitate: Secondary | ICD-10-CM | POA: Diagnosis present

## 2015-11-22 DIAGNOSIS — J9621 Acute and chronic respiratory failure with hypoxia: Secondary | ICD-10-CM | POA: Diagnosis present

## 2015-11-22 DIAGNOSIS — Z7982 Long term (current) use of aspirin: Secondary | ICD-10-CM | POA: Diagnosis not present

## 2015-11-22 DIAGNOSIS — J209 Acute bronchitis, unspecified: Secondary | ICD-10-CM | POA: Diagnosis present

## 2015-11-22 DIAGNOSIS — N390 Urinary tract infection, site not specified: Secondary | ICD-10-CM | POA: Diagnosis present

## 2015-11-22 DIAGNOSIS — J440 Chronic obstructive pulmonary disease with acute lower respiratory infection: Secondary | ICD-10-CM | POA: Diagnosis present

## 2015-11-22 DIAGNOSIS — Z993 Dependence on wheelchair: Secondary | ICD-10-CM | POA: Diagnosis not present

## 2015-11-22 DIAGNOSIS — R441 Visual hallucinations: Secondary | ICD-10-CM | POA: Diagnosis not present

## 2015-11-22 DIAGNOSIS — Z96641 Presence of right artificial hip joint: Secondary | ICD-10-CM | POA: Diagnosis present

## 2015-11-22 HISTORY — DX: Chronic respiratory failure, unspecified whether with hypoxia or hypercapnia: J96.10

## 2015-11-22 LAB — CBC WITH DIFFERENTIAL/PLATELET
Basophils Absolute: 0 10*3/uL (ref 0–0.1)
Basophils Relative: 0 %
EOS PCT: 0 %
Eosinophils Absolute: 0 10*3/uL (ref 0–0.7)
HCT: 36.6 % (ref 35.0–47.0)
Hemoglobin: 12 g/dL (ref 12.0–16.0)
LYMPHS ABS: 0.7 10*3/uL — AB (ref 1.0–3.6)
Lymphocytes Relative: 3 %
MCH: 29.4 pg (ref 26.0–34.0)
MCHC: 32.9 g/dL (ref 32.0–36.0)
MCV: 89.4 fL (ref 80.0–100.0)
Monocytes Absolute: 2 10*3/uL — ABNORMAL HIGH (ref 0.2–0.9)
Monocytes Relative: 10 %
Neutro Abs: 18.3 10*3/uL — ABNORMAL HIGH (ref 1.4–6.5)
Neutrophils Relative %: 87 %
PLATELETS: 303 10*3/uL (ref 150–440)
RBC: 4.1 MIL/uL (ref 3.80–5.20)
RDW: 13.6 % (ref 11.5–14.5)
WBC: 21.1 10*3/uL — ABNORMAL HIGH (ref 3.6–11.0)

## 2015-11-22 LAB — MAGNESIUM: MAGNESIUM: 1.4 mg/dL — AB (ref 1.7–2.4)

## 2015-11-22 LAB — BASIC METABOLIC PANEL
Anion gap: 6 (ref 5–15)
BUN: 15 mg/dL (ref 6–20)
CALCIUM: 9.2 mg/dL (ref 8.9–10.3)
CO2: 40 mmol/L — ABNORMAL HIGH (ref 22–32)
CREATININE: 0.72 mg/dL (ref 0.44–1.00)
Chloride: 89 mmol/L — ABNORMAL LOW (ref 101–111)
GFR calc Af Amer: >60 mL/min (ref >60)
GLUCOSE: 216 mg/dL — AB (ref 65–99)
Potassium: 4.2 mmol/L (ref 3.5–5.1)
Sodium: 135 mmol/L (ref 135–145)

## 2015-11-22 LAB — TROPONIN I: Troponin I: <0.03 ng/mL (ref <0.031)

## 2015-11-22 MED ORDER — DEXTROSE 5 % IV SOLN
1.0000 g | INTRAVENOUS | Status: DC
  Administered 2015-11-22: 1 g via INTRAVENOUS
  Filled 2015-11-22 (×2): qty 10

## 2015-11-22 MED ORDER — PANTOPRAZOLE SODIUM 40 MG PO TBEC: 40.0000 mg | DELAYED_RELEASE_TABLET | Freq: Every day | ORAL | Status: DC

## 2015-11-22 MED ORDER — ALBUTEROL SULFATE (2.5 MG/3ML) 0.083% IN NEBU
7.5000 mg | INHALATION_SOLUTION | Freq: Once | RESPIRATORY_TRACT | Status: DC
  Filled 2015-11-22: qty 9

## 2015-11-22 MED ORDER — SODIUM CHLORIDE 0.9% FLUSH
3.0000 mL | Freq: Two times a day (BID) | INTRAVENOUS | Status: DC
Start: 2015-11-22 — End: 2015-11-26
  Administered 2015-11-22 – 2015-11-24 (×4): 3 mL via INTRAVENOUS

## 2015-11-22 MED ORDER — SULFASALAZINE 500 MG PO TABS
500.0000 mg | ORAL_TABLET | Freq: Two times a day (BID) | ORAL | Status: DC
  Administered 2015-11-23 – 2015-11-24 (×2): 500 mg via ORAL
  Filled 2015-11-22 (×4): qty 1

## 2015-11-22 MED ORDER — METHYLPREDNISOLONE SODIUM SUCC 125 MG IJ SOLR
60.0000 mg | Freq: Four times a day (QID) | INTRAMUSCULAR | Status: DC
  Administered 2015-11-22 – 2015-11-25 (×11): 60 mg via INTRAVENOUS
  Filled 2015-11-22 (×11): qty 2

## 2015-11-22 MED ORDER — FUROSEMIDE 40 MG PO TABS: 20.0000 mg | ORAL_TABLET | Freq: Every day | ORAL | Status: DC

## 2015-11-22 MED ORDER — CHLORHEXIDINE GLUCONATE 0.12 % MT SOLN
15.0000 mL | Freq: Three times a day (TID) | OROMUCOSAL | Status: DC
  Administered 2015-11-22: 15 mL via OROMUCOSAL

## 2015-11-22 MED ORDER — METHYLPREDNISOLONE SODIUM SUCC 125 MG IJ SOLR: 125.0000 mg | Freq: Once | INTRAMUSCULAR | Status: DC

## 2015-11-22 MED ORDER — ONDANSETRON HCL 4 MG PO TABS: 4.0000 mg | ORAL_TABLET | Freq: Four times a day (QID) | ORAL | Status: DC | PRN

## 2015-11-22 MED ORDER — ONDANSETRON HCL 4 MG/2ML IJ SOLN: 4.0000 mg | Freq: Four times a day (QID) | INTRAMUSCULAR | Status: DC | PRN

## 2015-11-22 MED ORDER — ACETAMINOPHEN 325 MG PO TABS: 650.0000 mg | ORAL_TABLET | Freq: Four times a day (QID) | ORAL | Status: DC | PRN

## 2015-11-22 MED ORDER — SODIUM CHLORIDE 0.9 % IV SOLN: 250.0000 mL | INTRAVENOUS | Status: DC | PRN

## 2015-11-22 MED ORDER — DOCUSATE SODIUM 100 MG PO CAPS: 100.0000 mg | ORAL_CAPSULE | Freq: Two times a day (BID) | ORAL | Status: DC

## 2015-11-22 MED ORDER — LEVALBUTEROL HCL 1.25 MG/0.5ML IN NEBU
1.2500 mg | INHALATION_SOLUTION | Freq: Four times a day (QID) | RESPIRATORY_TRACT | Status: DC
  Administered 2015-11-22 – 2015-11-24 (×10): 1.25 mg via RESPIRATORY_TRACT
  Filled 2015-11-22 (×10): qty 0.5

## 2015-11-22 MED ORDER — CITALOPRAM HYDROBROMIDE 20 MG PO TABS: 20.0000 mg | ORAL_TABLET | Freq: Every day | ORAL | Status: DC

## 2015-11-22 MED ORDER — AMLODIPINE BESYLATE 10 MG PO TABS: 10.0000 mg | ORAL_TABLET | Freq: Every day | ORAL | Status: DC

## 2015-11-22 MED ORDER — TIOTROPIUM BROMIDE MONOHYDRATE 18 MCG IN CAPS
18.0000 ug | ORAL_CAPSULE | Freq: Every day | RESPIRATORY_TRACT | Status: DC
  Administered 2015-11-22: 18 ug via RESPIRATORY_TRACT
  Filled 2015-11-22: qty 5

## 2015-11-22 MED ORDER — ACETAMINOPHEN 650 MG RE SUPP: 650.0000 mg | Freq: Four times a day (QID) | RECTAL | Status: DC | PRN

## 2015-11-22 MED ORDER — MAGNESIUM SULFATE 2 GM/50ML IV SOLN
2.0000 g | Freq: Once | INTRAVENOUS | Status: AC
  Administered 2015-11-22: 2 g via INTRAVENOUS
  Filled 2015-11-22: qty 50

## 2015-11-22 MED ORDER — DEXTROSE 5 % IV SOLN
500.0000 mg | Freq: Once | INTRAVENOUS | Status: AC
  Administered 2015-11-22: 500 mg via INTRAVENOUS
  Filled 2015-11-22: qty 500

## 2015-11-22 MED ORDER — METOPROLOL TARTRATE 50 MG PO TABS: 75.0000 mg | ORAL_TABLET | Freq: Two times a day (BID) | ORAL | Status: DC

## 2015-11-22 MED ORDER — IPRATROPIUM-ALBUTEROL 0.5-2.5 (3) MG/3ML IN SOLN
9.0000 mL | Freq: Once | RESPIRATORY_TRACT | Status: AC
Start: 2015-11-22 — End: 2015-11-22
  Administered 2015-11-22: 9 mL via RESPIRATORY_TRACT
  Filled 2015-11-22: qty 9

## 2015-11-22 MED ORDER — ALBUTEROL SULFATE (2.5 MG/3ML) 0.083% IN NEBU: 2.5000 mg | INHALATION_SOLUTION | RESPIRATORY_TRACT | Status: DC | PRN

## 2015-11-22 MED ORDER — SODIUM CHLORIDE 0.9% FLUSH: 3.0000 mL | INTRAVENOUS | Status: DC | PRN

## 2015-11-22 MED ORDER — ASPIRIN EC 81 MG PO TBEC: 81.0000 mg | DELAYED_RELEASE_TABLET | Freq: Every day | ORAL | Status: DC

## 2015-11-22 MED ORDER — LORAZEPAM 0.5 MG PO TABS
0.5000 mg | ORAL_TABLET | Freq: Four times a day (QID) | ORAL | Status: DC | PRN
  Administered 2015-11-22: 0.5 mg via ORAL
  Filled 2015-11-22: qty 1

## 2015-11-22 MED ORDER — METHYLPREDNISOLONE SODIUM SUCC 125 MG IJ SOLR
125.0000 mg | Freq: Once | INTRAMUSCULAR | Status: AC
  Administered 2015-11-22: 125 mg via INTRAMUSCULAR
  Filled 2015-11-22: qty 2

## 2015-11-22 MED ORDER — LOSARTAN POTASSIUM 50 MG PO TABS: 100.0000 mg | ORAL_TABLET | Freq: Every day | ORAL | Status: DC

## 2015-11-22 MED ORDER — ENOXAPARIN SODIUM 40 MG/0.4ML ~~LOC~~ SOLN
40.0000 mg | SUBCUTANEOUS | Status: DC
  Administered 2015-11-22 – 2015-11-24 (×3): 40 mg via SUBCUTANEOUS
  Filled 2015-11-22 (×3): qty 0.4

## 2015-11-22 MED ORDER — POTASSIUM CHLORIDE CRYS ER 20 MEQ PO TBCR: 20.0000 meq | EXTENDED_RELEASE_TABLET | Freq: Two times a day (BID) | ORAL | Status: DC

## 2015-11-22 MED ORDER — HYDROCODONE-ACETAMINOPHEN 5-325 MG PO TABS: 1.0000 | ORAL_TABLET | Freq: Four times a day (QID) | ORAL | Status: DC | PRN

## 2015-11-22 MED ORDER — MOMETASONE FURO-FORMOTEROL FUM 200-5 MCG/ACT IN AERO
2.0000 | INHALATION_SPRAY | Freq: Two times a day (BID) | RESPIRATORY_TRACT | Status: DC
  Administered 2015-11-24: 2 via RESPIRATORY_TRACT
  Filled 2015-11-22: qty 8.8

## 2015-11-22 NOTE — Progress Notes (Signed)
Notified Dr.willis of pt not being able to tolerate taking some of her pill r/t falling right to sleep as soon as i wake her up. Pt had ativan on previous shift for agitation.  Acknowledged and ordered tele.

## 2015-11-22 NOTE — Progress Notes (Signed)
Chaplain spoke with family member and offered Pastoral Care and advised of the availability for Pastoral Care if needed.

## 2015-11-22 NOTE — Progress Notes (Signed)
Patient admitted to [facility] from ED at 1:30 PM.  [facility], admitted with sob and COPD exacerbations.  Some restless and agitation noted. Patient is incontinent of bowel and bladder. High fall risk, bed alarm on and patient educated.  Daughter at bedside, no complaints of pain. 2L of O2 via nasal cannula given. Will continue to monitor.  Chryl HeckSarah Catheleen Langhorne RN.

## 2015-11-22 NOTE — H&P (Signed)
[facility] - [place] at [facility]   PATIENT NAME: Holly Drake    MR#:  [id number]  DATE OF BIRTH:  1924-05-29  DATE OF ADMISSION:  11/24/2015  PRIMARY CARE PHYSICIAN: Randie Heinz, NP   REQUESTING/REFERRING PHYSICIAN: Myrna Blazer, MD  CHIEF COMPLAINT:   Chief Complaint  Patient presents with  . Shortness of Breath   worsening shortness of breath for 2 days.  HISTORY OF PRESENT ILLNESS:  Holly Drake  is a 82 y.o. female with a known history of COPD, dementia and hypertension. The patient was sent from memory center to the ED due to above chief complaint. The patient is demented, unable to provide any information. According to patient's daughter, the patient has had cough, shortness of breath and wheezing for the past 2 days, which has been worsening today. She was hypoxia with oxygen saturations at 60 to 80s. She was put on oxygen by nasal cannular 2 L and treated with nebulizer and IV Solu Medrol.  PAST MEDICAL HISTORY:   Past Medical History  Diagnosis Date  . COPD (chronic obstructive pulmonary disease) (HCC)   . Dementia   . OA (osteoarthritis)   . Hypertension   . Chronic respiratory failure (HCC)     PAST SURGICAL HISTORY:   Past Surgical History  Procedure Laterality Date  . Breast lumpectomy    . Tonsillectomy  2014    due to abscess  . Dilation and curettage, diagnostic / therapeutic    . Femur im nail Right 02/16/2015    Procedure: INTRAMEDULLARY (IM) NAIL FEMORAL;  Surgeon: Kennedy Bucker, MD;  Location: [facility] ORS;  Service: Orthopedics;  Laterality: Right;  . Right hip replacement    . Ivc    . Ivc filter    . Tonsillectomy    . Joint replacement      SOCIAL HISTORY:   Social History  Substance Use Topics  . Smoking status: Former Smoker -- 1.00 packs/day for 25 years    Types: Cigarettes  . Smokeless tobacco: Not on file  . Alcohol Use: No    FAMILY HISTORY:   Family History  Problem Relation Age of  Onset  . Parkinson's disease Mother     DRUG ALLERGIES:  No Known Allergies  REVIEW OF SYSTEMS:  Demented, unable to obtain.  MEDICATIONS AT HOME:   Prior to Admission medications   Medication Sig Start Date End Date Taking? Authorizing Provider  amLODipine (NORVASC) 10 MG tablet Take 10 mg by mouth daily.   Yes Historical Provider, MD  aspirin EC 81 MG EC tablet Take 1 tablet (81 mg total) by mouth daily. 03/30/15  Yes Altamese Dilling, MD  calcium carbonate (OS-CAL) 600 MG TABS tablet Take 600 mg by mouth daily.   Yes Historical Provider, MD  chlorhexidine (PERIDEX) 0.12 % solution Use as directed 15 mLs in the mouth or throat 3 (three) times daily.   Yes Historical Provider, MD  cholecalciferol (VITAMIN D) 1000 UNITS tablet Take 1,000-2,000 Units by mouth daily. Pt takes two tablets on Tuesday and Friday.   Pt takes one tablet on Sunday, Monday, Wednesday, Thursday, and Saturday.   Yes Historical Provider, MD  citalopram (CELEXA) 20 MG tablet Take 20 mg by mouth at bedtime.   Yes Historical Provider, MD  Cranberry 450 MG TABS Take 450 mg by mouth daily.   Yes Historical Provider, MD  docusate sodium (COLACE) 100 MG capsule Take 1 capsule (100 mg total) by mouth 2 (two) times daily. 02/21/15  Yes Ramonita Lab, MD  Fluticasone-Salmeterol (ADVAIR) 250-50 MCG/DOSE AEPB Inhale 1 puff into the lungs 2 (two) times daily.   Yes Historical Provider, MD  furosemide (LASIX) 20 MG tablet Take 1 tablet (20 mg total) by mouth daily. 03/30/15  Yes Altamese Dilling, MD  HYDROcodone-acetaminophen (NORCO/VICODIN) 5-325 MG per tablet Take 1 tablet by mouth every 6 (six) hours as needed for moderate pain. 03/30/15  Yes Altamese Dilling, MD  LORazepam (ATIVAN) 0.5 MG tablet Take 1 tablet (0.5 mg total) by mouth every 6 (six) hours as needed for anxiety. 02/21/15  Yes Ramonita Lab, MD  losartan (COZAAR) 100 MG tablet Take 100 mg by mouth daily.   Yes Historical Provider, MD  metoprolol tartrate  (LOPRESSOR) 25 MG tablet Take 75 mg by mouth 2 (two) times daily.   Yes Historical Provider, MD  Multiple Vitamins-Minerals (PRESERVISION AREDS 2) CAPS Take 1 capsule by mouth daily.   Yes Historical Provider, MD  ondansetron (ZOFRAN) 4 MG tablet Take 1 tablet (4 mg total) by mouth every 6 (six) hours as needed for nausea. Patient taking differently: Take 4 mg by mouth every 8 (eight) hours as needed for nausea.  02/21/15  Yes Ramonita Lab, MD  pantoprazole (PROTONIX) 40 MG tablet Take 40 mg by mouth daily.   Yes Historical Provider, MD  potassium & sodium phosphates (PHOS-NAK) 280-160-250 MG PACK Take 1 packet by mouth 4 (four) times daily -  before meals and at bedtime. 02/21/15  Yes Ramonita Lab, MD  potassium chloride SA (K-DUR,KLOR-CON) 20 MEQ tablet Take 20 mEq by mouth 2 (two) times daily.   Yes Historical Provider, MD  predniSONE (DELTASONE) 20 MG tablet Take 20 mg by mouth daily.    Yes Historical Provider, MD  sodium fluoride (PREVIDENT 5000 PLUS) 1.1 % CREA dental cream Place 1 application onto teeth 3 (three) times daily after meals.    Yes Historical Provider, MD  sulfaSALAzine (AZULFIDINE) 500 MG tablet Take 500 mg by mouth 2 (two) times daily after a meal.    Yes Historical Provider, MD  tiotropium (SPIRIVA) 18 MCG inhalation capsule Place 1 capsule (18 mcg total) into inhaler and inhale daily. 02/21/15  Yes Ramonita Lab, MD  acetaminophen (TYLENOL) 325 MG tablet Take 2 tablets (650 mg total) by mouth every 6 (six) hours as needed for mild pain (or Fever >/= 101). Patient not taking: Reported on 12/05/2015 02/21/15   Ramonita Lab, MD  budesonide (PULMICORT) 0.5 MG/2ML nebulizer solution Take 2 mLs (0.5 mg total) by nebulization 2 (two) times daily. Patient not taking: Reported on 11/24/2015 02/21/15   Ramonita Lab, MD  magnesium hydroxide (MILK OF MAGNESIA) 400 MG/5ML suspension Take 30 mLs by mouth daily as needed for mild constipation. Patient not taking: Reported on 12/07/2015 02/21/15   Ramonita Lab, MD   mometasone-formoterol (DULERA) 200-5 MCG/ACT AERO Inhale 2 puffs into the lungs 2 (two) times daily. Patient not taking: Reported on 12/08/2015 02/21/15   Ramonita Lab, MD  warfarin (COUMADIN) 3 MG tablet Take 1 tablet (3 mg total) by mouth daily. Patient not taking: Reported on 11/16/2015 03/30/15   Altamese Dilling, MD      VITAL SIGNS:  Blood pressure 112/74, pulse 124, temperature 99.3 F (37.4 C), temperature source Axillary, resp. rate 24, height  (1.575 m), weight 61.236 kg (135 lb), SpO2 92 %.  PHYSICAL EXAMINATION:  GENERAL:  82 y.o.-year-old patient lying in the bed with no acute distress.  EYES: Pupils equal, round, reactive to light and accommodation. No scleral  icterus. Extraocular muscles intact.  HEENT: Head atraumatic, normocephalic. Oropharynx and nasopharynx clear.  NECK:  Supple, no jugular venous distention. No thyroid enlargement, no tenderness.  LUNGS: Normal breath sounds bilaterally, has expiratory wheezing, no rales,rhonchi or crepitation. No use of accessory muscles of respiration.  CARDIOVASCULAR: S1, S2 normal. No murmurs, rubs, or gallops.  ABDOMEN: Soft, nontender, nondistended. Bowel sounds present. No organomegaly or mass.  EXTREMITIES: Bilateral lower extremity edema 1+, no cyanosis, or clubbing.  NEUROLOGIC: She does not follow commands, unable to exam. PSYCHIATRIC: The patient is demented. SKIN: No obvious rash, lesion, or ulcer.   LABORATORY PANEL:   CBC  Recent Labs Lab 11/15/2015 1018  WBC 21.1*  HGB 12.0  HCT 36.6  PLT 303   ------------------------------------------------------------------------------------------------------------------  Chemistries   Recent Labs Lab 11/23/2015 1018  NA 135  K 4.2  CL 89*  CO2 40*  GLUCOSE 216*  BUN 15  CREATININE 0.72  CALCIUM 9.2   ------------------------------------------------------------------------------------------------------------------  Cardiac Enzymes  Recent Labs Lab  11/23/2015 1018  TROPONINI <0.03   ------------------------------------------------------------------------------------------------------------------  RADIOLOGY:  Dg Chest 1 View  11/30/2015  CLINICAL DATA:  Shortness of breath for 2 days EXAM: CHEST 1 VIEW COMPARISON:  03/27/2015 FINDINGS: Cardiac shadow is stable. Aortic calcifications are again noted. Multiple old rib fractures with healing are noted on the right. Some chronic interstitial changes are seen. A calcified granuloma is noted in the right lung base. No acute bony abnormality is seen. IMPRESSION: No acute abnormality noted. Electronically Signed   By: Alcide CleverMark  Lukens M.D.   On: 11/28/2015 10:53    EKG:   Orders placed or performed during the hospital encounter of 11/16/2015  . ED EKG  . ED EKG    IMPRESSION AND PLAN:   COPD exacerbation. Continue IV submental, start Xopenex, Dulera and Spiriva.  Acute on chronic respiratory failure with hypoxia Continue Xopenex and oxygen by nasal cannula.  Sepsis , UTI with leukocytosis Start Rocephin, follow-up CBC, blood culture and urine culture.  Hypertension. Continue Norvasc and Lasix.  All the records are reviewed and case discussed with ED provider. Management plans discussed with the patient's daughter and they are in agreement.  CODE STATUS: DO NOT RESUSCITATE  TOTAL TIME TAKING CARE OF THIS PATIENT: 56 minutes.    Shaune Pollackhen, Leialoha Hanna M.D on 12/07/2015 at 1:33 PM  Between 7am to 6pm - Pager - [phone]  After 6pm go to [email or web address] - password [email or web address] [facility]  [facility]  Office  [phone]  CC: Primary care physician; Randie HeinzAnne Marie Mukamana, NP

## 2015-11-22 NOTE — Progress Notes (Signed)
Pharmacy Consult for Electrolyte Monitoring   No Known Allergies  Patient Measurements: Height: 5\' 2"  (157.5 cm) Weight: 135 lb (61.236 kg) IBW/kg (Calculated) : 50.1  Vital Signs: Temp: 98.9 F (37.2 C) (03/09 1400) Temp Source: Axillary (03/09 1400) BP: 118/52 mmHg (03/09 1600) Pulse Rate: 119 (03/09 1400) Intake/Output from previous day:   Intake/Output from this shift:    Labs:  Recent Labs  12/01/2015 1018  WBC 21.1*  HGB 12.0  HCT 36.6  PLT 303     Recent Labs  11/24/2015 1018 11/18/2015 1417  NA 135  --   K 4.2  --   CL 89*  --   CO2 40*  --   GLUCOSE 216*  --   BUN 15  --   CREATININE 0.72  --   CALCIUM 9.2  --   MG  --  1.4*   Estimated Creatinine Clearance: 41 mL/min (by C-G formula based on Cr of 0.72).   No results for input(s): GLUCAP in the last 72 hours.  Medical History: Past Medical History  Diagnosis Date  . COPD (chronic obstructive pulmonary disease) (HCC)   . Dementia   . OA (osteoarthritis)   . Hypertension   . Chronic respiratory failure (HCC)     Medications:  Scheduled:  . albuterol  7.5 mg Nebulization Once  . amLODipine  10 mg Oral Daily  . aspirin EC  81 mg Oral Daily  . cefTRIAXone (ROCEPHIN)  IV  1 g Intravenous Q24H  . chlorhexidine  15 mL Mouth/Throat TID  . citalopram  20 mg Oral QHS  . docusate sodium  100 mg Oral BID  . enoxaparin (LOVENOX) injection  40 mg Subcutaneous Q24H  . furosemide  20 mg Oral Daily  . levalbuterol  1.25 mg Nebulization 4 times per day  . losartan  100 mg Oral Daily  . magnesium sulfate 1 - 4 g bolus IVPB  2 g Intravenous Once  . methylPREDNISolone (SOLU-MEDROL) injection  60 mg Intravenous Q6H  . metoprolol tartrate  75 mg Oral BID  . mometasone-formoterol  2 puff Inhalation BID  . pantoprazole  40 mg Oral Daily  . potassium chloride SA  20 mEq Oral BID  . sodium chloride flush  3 mL Intravenous Q12H  . sulfaSALAzine  500 mg Oral BID PC  . tiotropium  18 mcg Inhalation Daily    Infusions:    Assessment: Pharmacy consulted to assist in monitoring and replacing electrolytes in this 82 y/o F with AECOPD.   Plan:  Magnesium replaced with 2 grams magnesium sulfate. Will f/u AM labs.   Holly Drake, Holly Drake 11/29/2015,7:20 PM

## 2015-11-22 NOTE — ED Provider Notes (Signed)
[facility] Emergency Department Provider Note  ____________________________________________  Time seen: Seen upon arrival to the emergency department  I have reviewed the triage vital signs and the nursing notes.   HISTORY  Chief Complaint Shortness of Breath    HPI Holly Drake is a 82 y.o. female with a history of COPD and dementia who is presenting to the emergency department today for 1 of worsening shortness of breath. She is also slightly more agitated today than her baseline. She is coming from the memory care unit. She is denying any pain at this time. In route she was found to be hypoxic to the 80s but EMS is that they were having difficulty getting a good tracing and also that her hands were very cold where they had their O2 sensor. Also discussed the case with the patient's daughter and power of attorney, Lars Masson, who says that the patient is amenable to being placed on a ventilator for short-term but nothing long-term. DO NOT RESUSCITATE form is on the chart.   Past Medical History  Diagnosis Date  . COPD (chronic obstructive pulmonary disease) (HCC)   . Dementia   . OA (osteoarthritis)   . Hypertension     Patient Active Problem List   Diagnosis Date Noted  . Hypoxia 12/11/2015  . UTI (lower urinary tract infection) 11/29/2015  . Acute on chronic respiratory failure with hypoxia (HCC) 03/27/2015  . COPD exacerbation (HCC) 03/27/2015  . Sepsis (HCC) 03/27/2015  . UTI (urinary tract infection) 03/27/2015  . Elevated troponin 03/27/2015  . HTN (hypertension), benign 03/27/2015  . Hip fracture requiring operative repair (HCC) 02/16/2015  . Dementia 02/16/2015    Past Surgical History  Procedure Laterality Date  . Breast lumpectomy    . Tonsillectomy  2014    due to abscess  . Dilation and curettage, diagnostic / therapeutic    . Femur im nail Right 02/16/2015    Procedure: INTRAMEDULLARY (IM) NAIL FEMORAL;  Surgeon: Kennedy Bucker,  MD;  Location: [facility] ORS;  Service: Orthopedics;  Laterality: Right;  . Right hip replacement    . Ivc    . Ivc filter    . Tonsillectomy    . Joint replacement      Current Outpatient Rx  Name  Route  Sig  Dispense  Refill  . amLODipine (NORVASC) 10 MG tablet   Oral   Take 10 mg by mouth daily.         Marland Kitchen aspirin EC 81 MG EC tablet   Oral   Take 1 tablet (81 mg total) by mouth daily.   30 tablet   0   . calcium carbonate (OS-CAL) 600 MG TABS tablet   Oral   Take 600 mg by mouth daily.         . chlorhexidine (PERIDEX) 0.12 % solution   Mouth/Throat   Use as directed 15 mLs in the mouth or throat 3 (three) times daily.         . cholecalciferol (VITAMIN D) 1000 UNITS tablet   Oral   Take 1,000-2,000 Units by mouth daily. Pt takes two tablets on Tuesday and Friday.   Pt takes one tablet on Sunday, Monday, Wednesday, Thursday, and Saturday.         . citalopram (CELEXA) 20 MG tablet   Oral   Take 20 mg by mouth at bedtime.         . Cranberry 450 MG TABS   Oral   Take 450 mg  by mouth daily.         Marland Kitchen. docusate sodium (COLACE) 100 MG capsule   Oral   Take 1 capsule (100 mg total) by mouth 2 (two) times daily.   10 capsule   0   . Fluticasone-Salmeterol (ADVAIR) 250-50 MCG/DOSE AEPB   Inhalation   Inhale 1 puff into the lungs 2 (two) times daily.         . furosemide (LASIX) 20 MG tablet   Oral   Take 1 tablet (20 mg total) by mouth daily.   30 tablet   0   . HYDROcodone-acetaminophen (NORCO/VICODIN) 5-325 MG per tablet   Oral   Take 1 tablet by mouth every 6 (six) hours as needed for moderate pain.   15 tablet   0   . LORazepam (ATIVAN) 0.5 MG tablet   Oral   Take 1 tablet (0.5 mg total) by mouth every 6 (six) hours as needed for anxiety.   20 tablet   0   . losartan (COZAAR) 100 MG tablet   Oral   Take 100 mg by mouth daily.         . metoprolol tartrate (LOPRESSOR) 25 MG tablet   Oral   Take 75 mg by mouth 2 (two) times daily.          . Multiple Vitamins-Minerals (PRESERVISION AREDS 2) CAPS   Oral   Take 1 capsule by mouth daily.         . ondansetron (ZOFRAN) 4 MG tablet   Oral   Take 1 tablet (4 mg total) by mouth every 6 (six) hours as needed for nausea. Patient taking differently: Take 4 mg by mouth every 8 (eight) hours as needed for nausea.    20 tablet   0   . pantoprazole (PROTONIX) 40 MG tablet   Oral   Take 40 mg by mouth daily.         . potassium & sodium phosphates (PHOS-NAK) 280-160-250 MG PACK   Oral   Take 1 packet by mouth 4 (four) times daily -  before meals and at bedtime.   15 packet   0   . potassium chloride SA (K-DUR,KLOR-CON) 20 MEQ tablet   Oral   Take 20 mEq by mouth 2 (two) times daily.         . predniSONE (DELTASONE) 20 MG tablet   Oral   Take 20 mg by mouth daily.          . sodium fluoride (PREVIDENT 5000 PLUS) 1.1 % CREA dental cream   dental   Place 1 application onto teeth 3 (three) times daily after meals.          Marland Kitchen. sulfaSALAzine (AZULFIDINE) 500 MG tablet   Oral   Take 500 mg by mouth 2 (two) times daily after a meal.          . tiotropium (SPIRIVA) 18 MCG inhalation capsule   Inhalation   Place 1 capsule (18 mcg total) into inhaler and inhale daily.   30 capsule   12   . acetaminophen (TYLENOL) 325 MG tablet   Oral   Take 2 tablets (650 mg total) by mouth every 6 (six) hours as needed for mild pain (or Fever >/= 101). Patient not taking: Reported on 11/25/2015         . budesonide (PULMICORT) 0.5 MG/2ML nebulizer solution   Nebulization   Take 2 mLs (0.5 mg total) by nebulization 2 (two) times daily. Patient not taking:  Reported on 11/14/2015   50 mL   0   . magnesium hydroxide (MILK OF MAGNESIA) 400 MG/5ML suspension   Oral   Take 30 mLs by mouth daily as needed for mild constipation. Patient not taking: Reported on 11/24/2015   360 mL   0   . mometasone-formoterol (DULERA) 200-5 MCG/ACT AERO   Inhalation   Inhale 2 puffs into  the lungs 2 (two) times daily. Patient not taking: Reported on 11/25/2015   1 Inhaler   0   . warfarin (COUMADIN) 3 MG tablet   Oral   Take 1 tablet (3 mg total) by mouth daily. Patient not taking: Reported on 12/13/2015   20 tablet   0     Allergies Review of patient's allergies indicates no known allergies.  Family History  Problem Relation Age of Onset  . Parkinson's disease Mother     Social History Social History  Substance Use Topics  . Smoking status: Former Smoker -- 1.00 packs/day for 25 years    Types: Cigarettes  . Smokeless tobacco: None  . Alcohol Use: No    Review of Systems Constitutional: No fever/chills Eyes: No visual changes. ENT: No sore throat. Cardiovascular: Denies chest pain. Respiratory: As above Gastrointestinal: No abdominal pain.  No nausea, no vomiting.  No diarrhea.  No constipation. Genitourinary: Negative for dysuria. Musculoskeletal: Negative for back pain. Skin: Negative for rash. Neurological: Negative for headaches, focal weakness or numbness.  10-point ROS otherwise negative.  ____________________________________________   PHYSICAL EXAM:  VITAL SIGNS: ED Triage Vitals  Enc Vitals Group     BP 11/25/2015 1015 148/67 mmHg     Pulse Rate 11/16/2015 1015 107     Resp 12/10/2015 1015 20     Temp 11/21/2015 1015 99.3 F (37.4 C)     Temp Source 11/23/2015 1015 Axillary     SpO2 12/11/2015 1015 99 %     Weight 11/21/2015 1015 135 lb (61.236 kg)     Height 12/07/2015 1015  (1.575 m)     Head Cir --      Peak Flow --      Pain Score --      Pain Loc --      Pain Edu? --      Excl. in GC? --     Constitutional: Alert and oriented.  in no acute distress. Slightly agitated, saying that she feels sorry for the staff. Eyes: Conjunctivae are normal. PERRL. EOMI. Head: Atraumatic. Nose: No congestion/rhinnorhea. Mouth/Throat: Mucous membranes are moist.   Neck: No stridor.   Cardiovascular: Tachycardic, regular rhythm. Grossly normal  heart sounds.  Good peripheral circulation. Respiratory: Mildly increased respiratory effort with supraclavicular retractions. Decreased lung sounds throughout with mild tachypnea. Gastrointestinal: Soft and nontender. No distention. Musculoskeletal: No lower extremity tenderness nor edema.  No joint effusions. Neurologic:  Normal speech and language. No gross focal neurologic deficits are appreciated.  Skin:  Skin is warm, dry and intact. No rash noted. Psychiatric: Mood and affect are normal. Speech and behavior are normal.  ____________________________________________   LABS (all labs ordered are listed, but only abnormal results are displayed)  Labs Reviewed  CBC WITH DIFFERENTIAL/PLATELET - Abnormal; Notable for the following:    WBC 21.1 (*)    Neutro Abs 18.3 (*)    Lymphs Abs 0.7 (*)    Monocytes Absolute 2.0 (*)    All other components within normal limits  BASIC METABOLIC PANEL - Abnormal; Notable for the following:    Chloride  89 (*)    CO2 40 (*)    Glucose, Bld 216 (*)    All other components within normal limits  TROPONIN I   ____________________________________________  EKG  ED ECG REPORT I, Yunique Dearcos,  Teena Irani, the attending physician, personally viewed and interpreted this ECG.   Date: 2015-12-18  EKG Time: 1029  Rate: 115  Rhythm: sinus tachycardia  Axis: Normal axis  Intervals:none  ST&T Change: Poor baseline but does not appear to have any ST elevation or depression. No abnormal T-wave inversion.  ____________________________________________  RADIOLOGY  No acute finding on the chest x-ray. ____________________________________________   PROCEDURES    ____________________________________________   INITIAL IMPRESSION / ASSESSMENT AND PLAN / ED COURSE  Pertinent labs & imaging results that were available during my care of the patient were reviewed by me and considered in my medical decision making (see chart for  details).  ----------------------------------------- 12:04 PM on 12-18-15 -----------------------------------------  Patient is 99% on 2 L of nasal cannula oxygen. Improved exam with decreased respiratory effort but still with mild supraclavicular retractions.  Movement with wheezing at this time. More albuterol was ordered for the patient. She will be admitted to the hospital. I witnessed the patient as well as the family are at the bedside. They're understanding and willing to comply. Signed out to Dr. Imogene Burn. ____________________________________________   FINAL CLINICAL IMPRESSION(S) / ED DIAGNOSES  COPD exacerbation    Myrna Blazer, MD 18-Dec-2015 [phone]

## 2015-11-22 NOTE — ED Notes (Signed)
[facility] difficulty breathing and presenting with low SpO2. EMS report SpO2 88% on NRB. EMS report pt became combative and removed NRB mask and IV. EMS administered duoneb x1. EMS report CBG 232 and sinus tachycardia.

## 2015-11-23 DIAGNOSIS — J9621 Acute and chronic respiratory failure with hypoxia: Secondary | ICD-10-CM

## 2015-11-23 DIAGNOSIS — N390 Urinary tract infection, site not specified: Secondary | ICD-10-CM

## 2015-11-23 DIAGNOSIS — F0390 Unspecified dementia without behavioral disturbance: Secondary | ICD-10-CM

## 2015-11-23 DIAGNOSIS — Z79899 Other long term (current) drug therapy: Secondary | ICD-10-CM

## 2015-11-23 DIAGNOSIS — F05 Delirium due to known physiological condition: Secondary | ICD-10-CM

## 2015-11-23 DIAGNOSIS — R7989 Other specified abnormal findings of blood chemistry: Secondary | ICD-10-CM

## 2015-11-23 DIAGNOSIS — I10 Essential (primary) hypertension: Secondary | ICD-10-CM

## 2015-11-23 DIAGNOSIS — R441 Visual hallucinations: Secondary | ICD-10-CM

## 2015-11-23 DIAGNOSIS — A419 Sepsis, unspecified organism: Principal | ICD-10-CM

## 2015-11-23 DIAGNOSIS — Z515 Encounter for palliative care: Secondary | ICD-10-CM

## 2015-11-23 LAB — BASIC METABOLIC PANEL
ANION GAP: 8 (ref 5–15)
BUN: 14 mg/dL (ref 6–20)
CO2: 37 mmol/L — AB (ref 22–32)
Calcium: 9.1 mg/dL (ref 8.9–10.3)
Chloride: 91 mmol/L — ABNORMAL LOW (ref 101–111)
Creatinine, Ser: 0.52 mg/dL (ref 0.44–1.00)
GFR calc non Af Amer: >60 mL/min (ref >60)
GLUCOSE: 226 mg/dL — AB (ref 65–99)
POTASSIUM: 4.3 mmol/L (ref 3.5–5.1)
Sodium: 136 mmol/L (ref 135–145)

## 2015-11-23 LAB — GLUCOSE, CAPILLARY
GLUCOSE-CAPILLARY: 224 mg/dL — AB (ref 65–99)
Glucose-Capillary: 164 mg/dL — ABNORMAL HIGH (ref 65–99)

## 2015-11-23 LAB — MAGNESIUM: Magnesium: 2.3 mg/dL (ref 1.7–2.4)

## 2015-11-23 LAB — HEPATIC FUNCTION PANEL
ALK PHOS: 64 U/L (ref 38–126)
ALT: 12 U/L — AB (ref 14–54)
AST: 17 U/L (ref 15–41)
Albumin: 3 g/dL — ABNORMAL LOW (ref 3.5–5.0)
TOTAL PROTEIN: 6 g/dL — AB (ref 6.5–8.1)
Total Bilirubin: 0.6 mg/dL (ref 0.3–1.2)

## 2015-11-23 LAB — CBC
HEMATOCRIT: 35.5 % (ref 35.0–47.0)
HEMOGLOBIN: 11.5 g/dL — AB (ref 12.0–16.0)
MCH: 29.1 pg (ref 26.0–34.0)
MCHC: 32.5 g/dL (ref 32.0–36.0)
MCV: 89.4 fL (ref 80.0–100.0)
Platelets: 291 10*3/uL (ref 150–440)
RBC: 3.97 MIL/uL (ref 3.80–5.20)
RDW: 13.8 % (ref 11.5–14.5)
WBC: 18.3 10*3/uL — ABNORMAL HIGH (ref 3.6–11.0)

## 2015-11-23 MED ORDER — MORPHINE SULFATE (CONCENTRATE) 10 MG/0.5ML PO SOLN
2.5000 mg | ORAL | Status: DC | PRN
  Administered 2015-11-23: 2.6 mg via ORAL
  Filled 2015-11-23: qty 1

## 2015-11-23 MED ORDER — METOPROLOL TARTRATE 1 MG/ML IV SOLN: 5.0000 mg | Freq: Four times a day (QID) | INTRAVENOUS | Status: DC

## 2015-11-23 MED ORDER — CITALOPRAM HYDROBROMIDE 20 MG PO TABS
20.0000 mg | ORAL_TABLET | Freq: Every day | ORAL | Status: DC
  Administered 2015-11-23 – 2015-11-24 (×2): 20 mg via ORAL
  Filled 2015-11-23 (×2): qty 1

## 2015-11-23 MED ORDER — PROCHLORPERAZINE 25 MG RE SUPP
25.0000 mg | Freq: Three times a day (TID) | RECTAL | Status: DC | PRN
  Filled 2015-11-23: qty 1

## 2015-11-23 MED ORDER — HYDRALAZINE HCL 20 MG/ML IJ SOLN: 10.0000 mg | Freq: Four times a day (QID) | INTRAMUSCULAR | Status: DC | PRN

## 2015-11-23 MED ORDER — PANTOPRAZOLE SODIUM 40 MG IV SOLR
40.0000 mg | INTRAVENOUS | Status: DC
  Administered 2015-11-23 – 2015-11-24 (×2): 40 mg via INTRAVENOUS
  Filled 2015-11-23 (×2): qty 40

## 2015-11-23 MED ORDER — DEXTROSE 5 % IV SOLN
1.0000 g | INTRAVENOUS | Status: DC
  Administered 2015-11-23 – 2015-11-24 (×2): 1 g via INTRAVENOUS
  Filled 2015-11-23 (×4): qty 10

## 2015-11-23 MED ORDER — INSULIN ASPART 100 UNIT/ML ~~LOC~~ SOLN
0.0000 [IU] | Freq: Three times a day (TID) | SUBCUTANEOUS | Status: DC
  Administered 2015-11-23: 3 [IU] via SUBCUTANEOUS
  Administered 2015-11-24 (×2): 2 [IU] via SUBCUTANEOUS
  Filled 2015-11-23 (×2): qty 2
  Filled 2015-11-23: qty 1
  Filled 2015-11-23: qty 2

## 2015-11-23 MED ORDER — MORPHINE SULFATE 25 MG/ML IV SOLN
1.0000 mg/h | INTRAVENOUS | Status: DC
  Administered 2015-11-23: 1 mg/h via INTRAVENOUS
  Filled 2015-11-23: qty 10

## 2015-11-23 MED ORDER — INSULIN ASPART 100 UNIT/ML ~~LOC~~ SOLN
0.0000 [IU] | Freq: Every day | SUBCUTANEOUS | Status: DC
  Administered 2015-11-24: 2 [IU] via SUBCUTANEOUS

## 2015-11-23 MED ORDER — BISACODYL 10 MG RE SUPP: 10.0000 mg | Freq: Every day | RECTAL | Status: DC | PRN

## 2015-11-23 MED ORDER — METOPROLOL TARTRATE 50 MG PO TABS
50.0000 mg | ORAL_TABLET | Freq: Two times a day (BID) | ORAL | Status: DC
  Administered 2015-11-23 – 2015-11-24 (×3): 50 mg via ORAL
  Filled 2015-11-23 (×3): qty 1

## 2015-11-23 NOTE — Progress Notes (Signed)
Daughter has requested that patient wear nasal cannula for comfort. Daughter aware that patient drops into the low 70's in 02 sats. Notified daughter and Dr. Madelon LipsVacchani that patient's o2 sat is current 75% on 6L nasal cannula. Daughter confirmed that she wanted the nasal cannula to stay in place as patient is uncomfortable when wearing the venturi mask. Adelina MingsKim Ameliya Nicotra RN-BC

## 2015-11-23 NOTE — Consult Note (Signed)
Palliative Medicine Inpatient Consult Note   Name: Holly Drake Date: 11/23/2015 MRN: [id number]  DOB: [birth year]  Referring Physician: Vaughan Basta, MD  Palliative Care consult requested for this 82 y.o. female for goals of medical therapy in patient with acute respiratory failure.  She was admitted with a UTI but her big symptom has been acute respiratory failure with hypoxia and her dementia and delerium interfering with ability to keep venturi mask in place (she is a mouth breather and sats drop into the 70's on nasal cannulas today).  BRIEF HISTORY and DISCUSSION: 44 you woman with dementia and COPD who was sent from [place] at [facility] due to shortness of breath for 2 days.  Pt had a negative CXR so this is being treated as COPD exacerbation.  She has a UA that suggests UTI and is on ABX.    She is actually on O2 chronically at the facility per daughter but here she is requiring venturi mask due to mouth breathing and due to sats dropping into the 70% range when she takes this off. NOTE that pt had a similar admission in July 2016 (though it wasn't quite this bad)  Pt had pulled out two peripheral IVs and some of her IV meds were changed to other routes at request of nursing and family.  A third IV was placed and wrapped well and is in place now.    She is not safe to eat all the time but at the facility she had been on a regular textured diet. Daughter is very interested in letting pt eat/ helping her eat --even just bites now and then --if possible. SLP cleared pt for Dys 1 with thins an precautions.    Daughter is not quite ready for pt to transition to comfort care. She wants her mother to have a chance to recover if she can.  She and I talked at some length about some misconceptions she had about palliative care and hospice etc.  But, this is just helpful in case they go with this direction in the near future.  They are not ready as yet.   I was summoned back to see  daughter at [place] today to talk again.   Pt has had a difficult time keeping the venturi mask on and nasal cannula O2 resulted in sats in low 70's.   I met with daughter and son in law again today --from 4 pm - 4:45 today in addition to meeting with them earlier today.  At this point, daughter is still not wanting comfort care totally b/c she feels her mother still has a chance at recovery. She does want her mother to have treatment BUT also some comfort orders woven into the plan of care.   We came up with the idea of starting a 1 mg drip of Morphine to help pt get some rest and to relieve the air hunger she feels. This is not a comfort care order but rather an order to address air hunger.   Nursing can go up to 2 mg/ hr IF needed tonight. Or down to zero/ hr. But they are not to go above 2 mg/ hr without contacting daughter first (she plans to go home to try to get some sleep).  Then, tomorrow, they will reassess with the care team. A CXR tomorrow will help identify pneumonia if present or CHF if present (pt IS coughing and sounds somewhat wet).  Tomorrow, perhaps pt can be permitted to eat a  dyshpagia diet if she is alert enough to participate.  Family is aware I am not here over the weekend but they are notified that the attending hospitalist will be able to address the issues and also potentially contact [facility] IF the decision is made to go there and IF pt is stable enough for transport.  She is NOT to be intubated and NOT to have BIPAP or CPAP and she is NOT to be put on HI FLow.    IMPRESSION: Acute on chronic resp failure with hypoxia ---no ABG ---presumed COPD exacerbation as first CXR was negative ---F/U CXR in am Moderately Advanced Dementia ---Pt still talks in complete sentences and recognizes her daughter. She has been eating a regular diet w/o difficulty per daughter.  Pt had been eating reasonable amts of food with each meal.  Pt is, however, in  memory unit. Seems like her dementia stage is moderate.  Pt in wheelchair due to arthritis presumably DJD (wheelchair bound) HTN H/O Remote PE/ DVT and IVC FILTER  AND she was on coumadin but was taken off due to intolerance of this  Former smoker 25 pack year hx UTI Sepsis Elevated Troponin Depression (daughter says pt HAS to have her Celexa)    REVIEW OF SYSTEMS:  Patient is not able to provide ROS due to dementia.  Pt is verbal and can communicate some of her needs but she is confused.   SPIRITUAL SUPPORT SYSTEM: Yes  -daughter.  SOCIAL HISTORY:  reports that she has quit smoking. Her smoking use included Cigarettes. She has a 25 pack-year smoking history. She does not have any smokeless tobacco history on file. She reports that she does not drink alcohol or use illicit drugs. Pt is from [facility] memory care unit.  Pt has a very involved and caring duaghter and son-in-law.  She is here all day today. There is also a son, but he won't be coming though he has been on the phone with his sister today.    LEGAL DOCUMENTS:  DNR form.  CODE STATUS: DNR  PAST MEDICAL HISTORY: Past Medical History  Diagnosis Date  . COPD (chronic obstructive pulmonary disease) ([place])   . Dementia   . OA (osteoarthritis)   . Hypertension   . Chronic respiratory failure ([place])     PAST SURGICAL HISTORY:  Past Surgical History  Procedure Laterality Date  . Breast lumpectomy    . Tonsillectomy  2014    due to abscess  . Dilation and curettage, diagnostic / therapeutic    . Femur im nail Right 02/16/2015    Procedure: INTRAMEDULLARY (IM) NAIL FEMORAL;  Surgeon: Hessie Knows, MD;  Location: [facility] ORS;  Service: Orthopedics;  Laterality: Right;  . Right hip replacement    . Ivc    . Ivc filter    . Tonsillectomy    . Joint replacement      ALLERGIES:  has No Known Allergies.  MEDICATIONS:  Current Facility-Administered Medications  Medication Dose Route Frequency Provider Last Rate Last Dose   . 0.9 %  sodium chloride infusion  250 mL Intravenous PRN Demetrios Loll, MD      . acetaminophen (TYLENOL) suppository 650 mg  650 mg Rectal Q6H PRN Demetrios Loll, MD      . albuterol (PROVENTIL) (2.5 MG/3ML) 0.083% nebulizer solution 2.5 mg  2.5 mg Nebulization Q2H PRN Demetrios Loll, MD      . albuterol (PROVENTIL) (2.5 MG/3ML) 0.083% nebulizer solution 7.5 mg  7.5 mg Nebulization  Once Orbie Pyo, MD   7.5 mg at 11/17/2015 1200  . bisacodyl (DULCOLAX) suppository 10 mg  10 mg Rectal Daily PRN Colleen Can, MD      . cefTRIAXone (ROCEPHIN) 1 g in dextrose 5 % 50 mL IVPB  1 g Intravenous Q24H Vaughan Basta, MD      . citalopram (CELEXA) tablet 20 mg  20 mg Oral Daily Colleen Can, MD   20 mg at 11/23/15 1407  . enoxaparin (LOVENOX) injection 40 mg  40 mg Subcutaneous Q24H Demetrios Loll, MD   40 mg at 12/06/2015 1956  . hydrALAZINE (APRESOLINE) injection 10 mg  10 mg Intravenous Q6H PRN Vaughan Basta, MD      . insulin aspart (novoLOG) injection 0-5 Units  0-5 Units Subcutaneous QHS Vaughan Basta, MD      . insulin aspart (novoLOG) injection 0-9 Units  0-9 Units Subcutaneous TID WC Vaughan Basta, MD      . levalbuterol Penne Lash) nebulizer solution 1.25 mg  1.25 mg Nebulization 4 times per day Demetrios Loll, MD   1.25 mg at 11/23/15 1413  . methylPREDNISolone sodium succinate (SOLU-MEDROL) 125 mg/2 mL injection 60 mg  60 mg Intravenous Q6H Demetrios Loll, MD   60 mg at 11/23/15 1303  . metoprolol (LOPRESSOR) tablet 50 mg  50 mg Oral BID Colleen Can, MD   50 mg at 11/23/15 1407  . mometasone-formoterol (DULERA) 200-5 MCG/ACT inhaler 2 puff  2 puff Inhalation BID Demetrios Loll, MD   2 puff at 12/02/2015 2204  . morphine 250 mg in dextrose 5 % 250 mL (1 mg/mL) infusion  1-2 mg/hr Intravenous Continuous Colleen Can, MD      . morphine CONCENTRATE 10 MG/0.5ML oral solution 2.6 mg  2.6 mg Oral Q2H PRN Colleen Can, MD   2.6 mg at 11/23/15 1220  . ondansetron  (ZOFRAN) injection 4 mg  4 mg Intravenous Q6H PRN Demetrios Loll, MD      . pantoprazole (PROTONIX) injection 40 mg  40 mg Intravenous Q24H Vaughan Basta, MD   40 mg at 11/23/15 1306  . prochlorperazine (COMPAZINE) suppository 25 mg  25 mg Rectal Q8H PRN Colleen Can, MD      . sodium chloride flush (NS) 0.9 % injection 3 mL  3 mL Intravenous Q12H Demetrios Loll, MD   3 mL at 11/23/15 1000  . sodium chloride flush (NS) 0.9 % injection 3 mL  3 mL Intravenous PRN Demetrios Loll, MD      . sulfaSALAzine (AZULFIDINE) tablet 500 mg  500 mg Oral BID PC Demetrios Loll, MD   500 mg at 12/01/2015 1800    Vital Signs: BP 150/70 mmHg  Pulse 128  Temp(Src) 98.6 F (37 C) (Oral)  Resp 20  Ht 5' 2"  (1.575 m)  Wt 61.236 kg (135 lb)  BMI 24.69 kg/m2  SpO2 94% Filed Weights   11/23/2015 1015  Weight: 61.236 kg (135 lb)    Estimated body mass index is 24.69 kg/(m^2) as calculated from the following:   Height as of this encounter: 5' 2"  (1.575 m).   Weight as of this encounter: 61.236 kg (135 lb).  PERFORMANCE STATUS (ECOG) : 4 - Bedbound --wheelchair bound at facility   PHYSICAL EXAM: Alternating between being calm and keeping vernturi mask in place and then being agitated and more confused and pulling at mask and lines EOMI Nares patent OP clear  Neck no JVD seen Hrt rrr tachy 130 Lungs with ronchi and  loud coughing intermittently. Abd soft and NT nl BS Ext fair no mottling or cyanosis seen  LABS: CBC:    Component Value Date/Time   WBC 18.3* 11/23/2015 0446   WBC 11.7* 03/02/2014 0432   HGB 11.5* 11/23/2015 0446   HGB 10.7* 03/02/2014 0432   HCT 35.5 11/23/2015 0446   HCT 31.7* 03/02/2014 0432   PLT 291 11/23/2015 0446   PLT 253 03/02/2014 0432   MCV 89.4 11/23/2015 0446   MCV 93 03/02/2014 0432   NEUTROABS 18.3* 12/01/2015 1018   NEUTROABS 10.5* 03/02/2014 0432   LYMPHSABS 0.7* 12/06/2015 1018   LYMPHSABS 0.5* 03/02/2014 0432   MONOABS 2.0* 12/08/2015 1018   MONOABS 0.6 03/02/2014  0432   EOSABS 0.0 12/12/2015 1018   EOSABS 0.0 03/02/2014 0432   BASOSABS 0.0 11/29/2015 1018   BASOSABS 0.0 03/02/2014 0432   Comprehensive Metabolic Panel:    Component Value Date/Time   NA 136 11/23/2015 0446   NA 135* 03/04/2014 0429   K 4.3 11/23/2015 0446   K 3.6 03/04/2014 0429   CL 91* 11/23/2015 0446   CL 96* 03/04/2014 0429   CO2 37* 11/23/2015 0446   CO2 34* 03/04/2014 0429   BUN 14 11/23/2015 0446   BUN 15 03/04/2014 0429   CREATININE 0.52 11/23/2015 0446   CREATININE 0.61 03/04/2014 0429   GLUCOSE 226* 11/23/2015 0446   GLUCOSE 194* 03/04/2014 0429   CALCIUM 9.1 11/23/2015 0446   CALCIUM 8.5 03/04/2014 0429   AST 17 11/23/2015 0446   AST 21 03/01/2014 0312   ALT 12* 11/23/2015 0446   ALT 16 03/01/2014 0312   ALKPHOS 64 11/23/2015 0446   ALKPHOS 76 03/01/2014 0312   BILITOT 0.6 11/23/2015 0446   BILITOT 0.6 03/01/2014 0312   PROT 6.0* 11/23/2015 0446   PROT 7.1 03/01/2014 0312   ALBUMIN 3.0* 11/23/2015 0446   ALBUMIN 3.2* 03/01/2014 0312    More than 50% of the visit was spent in counseling/coordination of care: Yes  Time Spent:   120 minutes

## 2015-11-23 NOTE — Evaluation (Signed)
Clinical/Bedside Swallow Evaluation Patient Details  Name: Holly Drake MRN: [id number] Date of Birth: [birth year]  Today's Date: 11/23/2015 Time: SLP Start Time (ACUTE ONLY): 1500 SLP Stop Time (ACUTE ONLY): 1600 SLP Time Calculation (min) (ACUTE ONLY): 60 min  Past Medical History:  Past Medical History  Diagnosis Date  . COPD (chronic obstructive pulmonary disease) ([facility])   . Dementia   . OA (osteoarthritis)   . Hypertension   . Chronic respiratory failure [facility])    Past Surgical History:  Past Surgical History  Procedure Laterality Date  . Breast lumpectomy    . Tonsillectomy  2014    due to abscess  . Dilation and curettage, diagnostic / therapeutic    . Femur im nail Right 02/16/2015    Procedure: INTRAMEDULLARY (IM) NAIL FEMORAL;  Surgeon: Kennedy Bucker, MD;  Location: [facility] ORS;  Service: Orthopedics;  Laterality: Right;  . Right hip replacement    . Ivc    . Ivc filter    . Tonsillectomy    . Joint replacement     HPI:  Pt is a 82 y.o. female with a known history of COPD, chronic respiratory failure, dementia and hypertension. The patient was sent from memory center to the ED due to above chief complaint. The patient is demented, unable to provide any information. According to patient's daughter, the patient has had cough, shortness of breath and wheezing for the past 2 days, which has been worsening today. She was hypoxia with oxygen saturations at 60 to 80s. She was put on oxygen by nasal cannular 2 L and treated with nebulizer and IV Solu Medrol. Pt has not been wanting to wear the O2 mask for support sec. to agitiation; Dtr wants pt to have the [place] O2 support.    Assessment / Plan / Recommendation Clinical Impression  Pt appeared to adequately tolerate trials of Nectar liquids and purees w/ no immediate, overt s/s of aspiration noted; clear vocal quality b/t trials when pt spoke. Pt exhibited fair toleration of thin liquid trials but when she belched when drinking, she  immediately coughed and appeared uncoordinated w/ the swallow. Pt also tended to talk immediately post trials, b/t trials, sec. to her declined Cognitive status and confusion. No significant oral phase deficits noted w/ po trials, however, min. decreased awareness during tasks of eating/drinking occured. Due pt pt's declined respiratory status overall requiring increased O2 support (but not wearing the O2 mask), and increased Confusion requiring mod. cues to redirect to task and participate w/ tasks, pt is at increased risk for aspiration w/ po's. Rec. a consevative diet of Dys. 1 w/ Nectar liquids at this time w/ trials to upgrade as tolerates; strict aspiration precautions; Meds in Puree - crushed if nec./able. Feeding assistance and support at meals - give po's only when breathing is calm and pt is attentive for safe participation. NSG updated. Family present.     Aspiration Risk  Mild aspiration risk (-moderate aspiration risk)    Diet Recommendation  Dys. 1 w/ Nectar liquids; strict aspiration precautions; feeding assistance at meals  Medication Administration: Whole meds with puree    Other  Recommendations Recommended Consults:  (Dietician) Oral Care Recommendations: Oral care BID;Staff/trained caregiver to provide oral care Other Recommendations: Order thickener from pharmacy;Prohibited food (jello, ice cream, thin soups);Remove water pitcher   Follow up Recommendations   (TBD)    Frequency and Duration min 3x week  2 weeks       Prognosis Prognosis for Safe Diet Advancement: Fair  Barriers to Reach Goals: Cognitive deficits;Severity of deficits (respiratory status)      Swallow Study   General Date of Onset: 12/04/2015 HPI: Pt is a 82 y.o. female with a known history of COPD, chronic respiratory failure, dementia and hypertension. The patient was sent from memory center to the ED due to above chief complaint. The patient is demented, unable to provide any information. According to  patient's daughter, the patient has had cough, shortness of breath and wheezing for the past 2 days, which has been worsening today. She was hypoxia with oxygen saturations at 60 to 80s. She was put on oxygen by nasal cannular 2 L and treated with nebulizer and IV Solu Medrol. Pt has not been wanting to wear the O2 mask for support sec. to agitiation; Dtr wants pt to have the [place] O2 support.  Type of Study: Bedside Swallow Evaluation Previous Swallow Assessment: none reported Diet Prior to this Study: Regular;Thin liquids (no reported swallowing deficits w/ oral diet prior) Temperature Spikes Noted: No (wbc 18.3 trending down) Respiratory Status: Nasal cannula (6 liters) History of Recent Intubation: No Behavior/Cognition: Alert;Confused;Agitated;Distractible;Requires cueing Oral Cavity Assessment: Within Functional Limits Oral Care Completed by SLP: Recent completion by staff Oral Cavity - Dentition: Adequate natural dentition;Missing dentition Vision: Functional for self-feeding Self-Feeding Abilities: Total assist Patient Positioning: Upright in bed Baseline Vocal Quality: Normal Volitional Cough: Strong Volitional Swallow: Able to elicit    Oral/Motor/Sensory Function Overall Oral Motor/Sensory Function: Within functional limits (grossly assessed)   Ice Chips Ice chips: Within functional limits Presentation: Spoon (fed; 2 trials)   Thin Liquid Thin Liquid: Impaired Presentation: Cup;Straw (assisted; 3-4 trial swallows) Oral Phase Impairments:  (none) Oral Phase Functional Implications:  (none) Pharyngeal  Phase Impairments: Cough - Immediate (w/ belching occuring) Other Comments: confused    Nectar Thick Nectar Thick Liquid: Within functional limits Presentation: Cup;Self Fed;Straw (assisted; 3 trials)   Honey Thick Honey Thick Liquid: Not tested   Puree Puree: Impaired Presentation: Spoon (fed; 6-7 trials) Oral Phase Impairments:  (confusion) Oral Phase Functional Implications:   (none) Pharyngeal Phase Impairments: Cough - Delayed (x1 trial) Other Comments: unsure if related to the po trials given; pt also talking frequently   Solid   GO   Solid: Not tested       Jerilynn SomKatherine Ioannis Schuh, MS, CCC-SLP  Holly Drake 11/23/2015,4:11 PM

## 2015-11-23 NOTE — Progress Notes (Signed)
Patient is confused and needs frequent prompting to keep venturi mask on.  Mask replaced on patient 3 times this hour. Sinus tachy and increased RR noted when mask is removed by patient.

## 2015-11-23 NOTE — Progress Notes (Signed)
RT to room to give scheduled neb tx, found patient on room air after she removed Venti mask.  O2 sat 79%, patient does not appear to be in resp distress.  RT gave neb tx, patient combative, does not want to keep mask on.  RT held neb mask to face to complete neb, then placed on 3LPM [place].  Will continue to monitor.

## 2015-11-23 NOTE — Progress Notes (Signed)
Pharmacy Consult for Electrolyte Monitoring   No Known Allergies  Patient Measurements: Height: 5\' 2"  (157.5 cm) Weight: 135 lb (61.236 kg) IBW/kg (Calculated) : 50.1  Vital Signs: Temp: 97.9 F (36.6 C) (03/10 0441) Temp Source: Oral (03/10 0441) BP: 158/53 mmHg (03/10 0441) Pulse Rate: 132 (03/10 0441) Intake/Output from previous day: 03/09 0701 - 03/10 0700 In: 98 [I.V.:3; IV Piggyback:95] Out: -  Intake/Output from this shift:    Labs:  Recent Labs  2016/09/06 1018 11/23/15 0446  WBC 21.1* 18.3*  HGB 12.0 11.5*  HCT 36.6 35.5  PLT 303 291     Recent Labs  2016/09/06 1018 2016/09/06 1417 11/23/15 0446  NA 135  --  136  K 4.2  --  4.3  CL 89*  --  91*  CO2 40*  --  37*  GLUCOSE 216*  --  226*  BUN 15  --  14  CREATININE 0.72  --  0.52  CALCIUM 9.2  --  9.1  MG  --  1.4* 2.3   Estimated Creatinine Clearance: 41 mL/min (by C-G formula based on Cr of 0.52).   No results for input(s): GLUCAP in the last 72 hours.  Medical History: Past Medical History  Diagnosis Date  . COPD (chronic obstructive pulmonary disease) ([facility])   . Dementia   . OA (osteoarthritis)   . Hypertension   . Chronic respiratory failure ([facility])     Medications:  Scheduled:  . albuterol  7.5 mg Nebulization Once  . cefTRIAXone (ROCEPHIN)  IV  1 g Intravenous Q24H  . citalopram  20 mg Oral Daily  . enoxaparin (LOVENOX) injection  40 mg Subcutaneous Q24H  . levalbuterol  1.25 mg Nebulization 4 times per day  . methylPREDNISolone (SOLU-MEDROL) injection  60 mg Intravenous Q6H  . metoprolol  50 mg Oral BID  . mometasone-formoterol  2 puff Inhalation BID  . pantoprazole (PROTONIX) IV  40 mg Intravenous Q24H  . sodium chloride flush  3 mL Intravenous Q12H  . sulfaSALAzine  500 mg Oral BID PC   Infusions:    Assessment: Pharmacy consulted to assist in monitoring and replacing electrolytes in this 82 y/o F with AECOPD. Electrolytes within normal limits this morning.  Plan:  Patient  may become comfort care but has not as of yet. Will place order for BMET in am.   Clovia CuffLisa Donnel Venuto, PharmD, BCPS 11/23/2015 11:47 AM

## 2015-11-23 NOTE — Progress Notes (Signed)
Palliative Care Update  I have examined pt (who is quite confused) and I have talked with pt's nurse and also just now met with pt's daughter, Holly Drake.  Daughter wishes to continue treatment and oxygen and does not want to go to comfort care status as yet.  She says this 'could be the time she goes but if her O2 can be kept on and if she can get better, she wants [place] to keep trying for now.  The plan now is for me to review meds to limit oral meds.  Daughter says pt HAS to have Zyprexa or she becomes 'bonkers'.  Pt is not currently on this home med (it is often DCd during illness b/c it can cause excessive sedation in sick elderly).    Son-in-law is currently in the room literally holding the O2 mask on pts face. Daughter hopes to reason with pt about keeping the O2 on --saying her dementia is not that bad and she would usually listen to her and also would always know who she was.    I am to review meds now.  Pt may end up with a PICC line or another peripheral (she has pulled two IVs out).  Pt might have to get some ABX IM for now (will discuss with attending of course).  Then, after a few hours, I will talk again with daughter.    Kirby Funk, MD

## 2015-11-23 NOTE — Progress Notes (Signed)
[facility] [place] at [facility]   PATIENT NAME: Holly Drake    MR#:  [id number]  DATE OF BIRTH:  11/01/1923  SUBJECTIVE:  CHIEF COMPLAINT:   Chief Complaint  Patient presents with  . Shortness of Breath    patient lives in a memory care unit because of terminal dementia, sent with severe hypoxia. Being treated for COPD.   But due to her dementia she is not compliant with keeping her oxygen on and she runs constantly hypoxic, and she is running tachycardic on cardiac monitor. She is drowsy but easily arousable, but remains confused about her surroundings.  REVIEW OF SYSTEMS:  ROS  Patient is not able to give any review of system because of baseline dementia.  DRUG ALLERGIES:  No Known Allergies  VITALS:  Blood pressure 158/53, pulse 132, temperature 97.9 F (36.6 C), temperature source Oral, resp. rate 28, height 5\' 2"  (1.575 m), weight 61.236 kg (135 lb), SpO2 79 %.  PHYSICAL EXAMINATION:  GENERAL:  82 y.o.-year-old patient lying in the bed with no acute distress. Drowsy. EYES: Pupils equal, round, reactive to light . No scleral icterus. Extraocular muscles intact.  HEENT: Head atraumatic, normocephalic. Oropharynx and nasopharynx clear.  NECK:  Supple, no jugular venous distention. No thyroid enlargement, no tenderness.  LUNGS: Normal breath sounds bilaterally, some wheezing,  no crepitation. No use of accessory muscles of respiration.     She returns hypoxic, and currently breathing with open mouth, does not keep the Ventimask on because of her terminal dementia. CARDIOVASCULAR: S1, S2 normal. Tachycardia No murmurs, rubs, or gallops.  ABDOMEN: Soft, nontender, nondistended. Bowel sounds present. No organomegaly or mass.  EXTREMITIES: No pedal edema, cyanosis, or clubbing. Chronic deformities on both feet present. NEUROLOGIC: Patient is drowsy, moves her limbs slightly to stimuli mainly upper limbs. She is arousable and opens eyes, but does not talk  much. And goes back to sleep  PSYCHIATRIC: The patient is drowsy, easily arousable.  SKIN: No obvious rash, lesion, or ulcer.   Physical Exam LABORATORY PANEL:   CBC  Recent Labs Lab 11/23/15 0446  WBC 18.3*  HGB 11.5*  HCT 35.5  PLT 291   ------------------------------------------------------------------------------------------------------------------  Chemistries   Recent Labs Lab 11/23/15 0446  NA 136  K 4.3  CL 91*  CO2 37*  GLUCOSE 226*  BUN 14  CREATININE 0.52  CALCIUM 9.1  MG 2.3   ------------------------------------------------------------------------------------------------------------------  Cardiac Enzymes  Recent Labs Lab 12/03/2015 1018  TROPONINI <0.03   ------------------------------------------------------------------------------------------------------------------  RADIOLOGY:  Dg Chest 1 View  11/21/2015  CLINICAL DATA:  Shortness of breath for 2 days EXAM: CHEST 1 VIEW COMPARISON:  03/27/2015 FINDINGS: Cardiac shadow is stable. Aortic calcifications are again noted. Multiple old rib fractures with healing are noted on the right. Some chronic interstitial changes are seen. A calcified granuloma is noted in the right lung base. No acute bony abnormality is seen. IMPRESSION: No acute abnormality noted. Electronically Signed   By: Alcide CleverMark  Lukens M.D.   On: 12/09/2015 10:53    ASSESSMENT AND PLAN:   Principal Problem:   COPD exacerbation (HCC) Active Problems:   Hypoxia   UTI (lower urinary tract infection)  * Sepsis   Likely secondary to acute bronchitis and COPD exacerbation   UA not checked yet, I will check it today.   Continue with IV antibiotics.  * Ac on ch hypoxic respi failure due to COPD exacerbation   On ch 2 ltr oxygen.   Currently not compliant with  venti mask, keep pulling off.   IV steroids, Nebs, IV Abx.   Patient is DO NOT RESUSCITATE, and I explained in this situation to her daughter on phone in detail.   Due to hypoxia  she stays drowsy and would not be able to safely tolerate feeding well oral medications.  * Leukocytosis   Likely due to acute bronchitis, we will check urinalysis with  * Terminal dementia   She lives in a memory care unit, as per daughter up until now she did not had any trouble swallowing food.  * Hypertension and tachycardia   It is also driven by her hypoxia.   Not assessed give oral tablets at this point.   We will give scheduled metoprolol for hypertension, and hydralazine to help with that.  All the records are reviewed and case discussed with Care Management/Social Workerr. Management plans discussed with the patient, family and they are in agreement.  CODE STATUS: DO NOT RESUSCITATE  TOTAL TIME TAKING CARE OF THIS PATIENT: 50 critical care minutes.  Condition is critical because of presence with sepsis, hypoxia, tachycardia- and further complicated because of her baseline dementia which limits the treatment options due to cooperation. I discussed with her daughter on phone in detail about this she understand and agree with current plan, please call palliative care consult to help her make further decisions.   POSSIBLE D/C IN 2-3 DAYS, DEPENDING ON CLINICAL CONDITION.   Altamese Dilling M.D on 11/23/2015   Between 7am to 6pm - Pager - [phone]  After 6pm go to [email or web address] - password [email or web address] [facility]  [facility]  Office  [phone]  CC: Primary care physician; Randie Heinz, NP  Note: This dictation was prepared with Dragon dictation along with smaller phrase technology. Any transcriptional errors that result from this process are unintentional.

## 2015-11-23 NOTE — Progress Notes (Signed)
Patient persistently pulling venturi mask off, agitated and stated feeling strangled from the mask.  Daughter at bedside requested nasal cannula be used for comfort.  Patient tolerated nasal cannula well.  Dr. Madelon LipsVacchani notified.  Chryl HeckSarah Hollyanne Schloesser RN.

## 2015-11-23 NOTE — Progress Notes (Signed)
Inpatient Diabetes Program Recommendations  AACE/ADA: New Consensus Statement on Inpatient Glycemic Control (2015)  Target Ranges:  Prepandial:   less than 140 mg/dL      Peak postprandial:   less than 180 mg/dL (1-2 hours)      Critically ill patients:  140 - 180 mg/dL   Review of Glycemic Control; lab glucose elevated  Results for Holly Drake, Holly Drake (MRN [id number]) as of 11/23/2015 13:52  Ref. Range 11/19/2015 10:18 12/07/2015 14:17 11/23/2015 04:46  Glucose Latest Ref Range: 65-99 mg/dL 782216 (H)  956226 (H)   Inpatient Diabetes Program Recommendations:  elevated lab glucose but no documented diabetes.  Consider checking blood sugars tid and hs. Do you want to consider Novolog insulin 0-9 units tid and Novolog 0-5 units qhs?    Susette RacerJulie Lovie Agresta, RN, BA, MHA, CDE Diabetes Coordinator Inpatient Diabetes Program  [phone] (Team Pager) [phone] [facility] Office) 11/23/2015 1:54 PM

## 2015-11-23 NOTE — Progress Notes (Signed)
Palliative Care Update  Pt has had a difficult time keeping the venturi mask on and nasal cannula O2 resulted in sats in low 70's.    I met with daughter and son in law again today --from 4 pm - 4:45 today in addition to meeting with them earlier today.  At this point, daughter is still not wanting comfort care totally b/c she feels her mother still has a chance at recovery.  She does want her mother to have treatment BUT also some comfort orders woven into the plan of care.    We came up with the idea of starting a 1 mg drip of Morphine to help pt get some rest and to relieve the air hunger she feels. This is not a comfort care order but rather an order to address air hunger.    Nursing can go up to 2 mg/ hr IF needed tonight. Or down to zero/ hr.  But they are not to go above 2 mg/ hr without contacting daughter first (she plans to go home to try to get some sleep).  Then, tomorrow, they will reassess with the care team.  A CXR tomorrow will help identify pneumonia if present or CHF if present  (pt IS coughing and sounds somewhat wet).  Tomorrow, perhaps pt can be permitted to eat a dyshpagia diet if she is alert enough to participate.  Family is aware I am not here over the weekend but they are notified that the attending hospitalist will be able to address the issues and also potentially [facility] IF the decision is made to go there and IF pt is stable enough for transport.  She is NOT to be intubated and NOT to have BIPAP or CPAP and she is NOT to be put on HI FLow.  See orders.  Total time with pt/ family today is about 2 hours.  Kirby Funk, MD

## 2015-11-23 NOTE — NC FL2 (Signed)
[place] MEDICAID FL2 LEVEL OF CARE SCREENING TOOL     IDENTIFICATION  Patient Name: Holly Drake Birthdate: 11/11/1923 Sex: female Admission Date (Current Location): 12/06/2015  [facility] [place]   Facility and Address:  [facility], [address], [place], [place] [address]      Provider Number: [id number]  Attending Physician Name and Address:  Altamese DillingVaibhavkumar Sabria Florido, MD  Relative Name and Phone Number:       Current Level of Care: Hospital Recommended Level of Care: Memory Care Prior Approval Number:    Date Approved/Denied:   PASRR Number:    Discharge Plan: Other (Comment) (Memory Care)    Current Diagnoses: Patient Active Problem List   Diagnosis Date Noted  . Hypoxia 12/07/15  . UTI (lower urinary tract infection) 12/07/15  . Acute on chronic respiratory failure with hypoxia (HCC) 03/27/2015  . COPD exacerbation (HCC) 03/27/2015  . Sepsis (HCC) 03/27/2015  . UTI (urinary tract infection) 03/27/2015  . Elevated troponin 03/27/2015  . HTN (hypertension), benign 03/27/2015  . Hip fracture requiring operative repair (HCC) 02/16/2015  . Dementia 02/16/2015    Orientation RESPIRATION BLADDER Height & Weight     Self  O2 (2L) Continent Weight: 135 lb (61.236 kg) Height:  5\' 2"  (157.5 cm)  BEHAVIORAL SYMPTOMS/MOOD NEUROLOGICAL BOWEL NUTRITION STATUS      Continent Diet (NPO, except for ice chips and sips with medications)  AMBULATORY STATUS COMMUNICATION OF NEEDS Skin   Limited Assist Verbally Normal                       Personal Care Assistance Level of Assistance  Bathing, Feeding, Dressing Bathing Assistance: Limited assistance Feeding assistance: Limited assistance Dressing Assistance: Limited assistance     Functional Limitations Info  Sight, Hearing, Speech Sight Info: Adequate Hearing Info: Adequate Speech Info: Adequate    SPECIAL CARE FACTORS FREQUENCY                Speech  Therapy Frequency: Eval      Contractures      Additional Factors Info  Code Status, Allergies Code Status Info: DNR Allergies Info: No known alleriges           Current Medications (11/23/2015):  This is the current hospital active medication list Current Facility-Administered Medications  Medication Dose Route Frequency Provider Last Rate Last Dose  . 0.9 %  sodium chloride infusion  250 mL Intravenous PRN Shaune PollackQing Chen, MD      . acetaminophen (TYLENOL) suppository 650 mg  650 mg Rectal Q6H PRN Shaune PollackQing Chen, MD      . albuterol (PROVENTIL) (2.5 MG/3ML) 0.083% nebulizer solution 2.5 mg  2.5 mg Nebulization Q2H PRN Shaune PollackQing Chen, MD      . albuterol (PROVENTIL) (2.5 MG/3ML) 0.083% nebulizer solution 7.5 mg  7.5 mg Nebulization Once Myrna Blazeravid Matthew Schaevitz, MD   7.5 mg at December 22, 2015 1200  . bisacodyl (DULCOLAX) suppository 10 mg  10 mg Rectal Daily PRN Suan HalterMargaret F Campbell, MD      . cefTRIAXone (ROCEPHIN) 1 g in dextrose 5 % 50 mL IVPB  1 g Intravenous Q24H Altamese DillingVaibhavkumar Yaire Kreher, MD      . citalopram (CELEXA) tablet 20 mg  20 mg Oral Daily Suan HalterMargaret F Campbell, MD      . enoxaparin (LOVENOX) injection 40 mg  40 mg Subcutaneous Q24H Shaune PollackQing Chen, MD   40 mg at December 22, 2015 1956  . hydrALAZINE (APRESOLINE) injection 10 mg  10  mg Intravenous Q6H PRN Altamese Dilling, MD      . levalbuterol Pauline Aus) nebulizer solution 1.25 mg  1.25 mg Nebulization 4 times per day Shaune Pollack, MD   1.25 mg at 11/23/15 0813  . methylPREDNISolone sodium succinate (SOLU-MEDROL) 125 mg/2 mL injection 60 mg  60 mg Intravenous Q6H Shaune Pollack, MD   60 mg at 11/23/15 0512  . metoprolol (LOPRESSOR) tablet 50 mg  50 mg Oral BID Suan Halter, MD      . mometasone-formoterol [facility]) 200-5 MCG/ACT inhaler 2 puff  2 puff Inhalation BID Shaune Pollack, MD   2 puff at 2015-12-13 2204  . morphine CONCENTRATE 10 MG/0.5ML oral solution 2.6 mg  2.6 mg Oral Q2H PRN Suan Halter, MD      . ondansetron [facility]) injection 4 mg  4 mg  Intravenous Q6H PRN Shaune Pollack, MD      . pantoprazole (PROTONIX) injection 40 mg  40 mg Intravenous Q24H Altamese Dilling, MD      . prochlorperazine (COMPAZINE) suppository 25 mg  25 mg Rectal Q8H PRN Suan Halter, MD      . sodium chloride flush (NS) 0.9 % injection 3 mL  3 mL Intravenous Q12H Shaune Pollack, MD   3 mL at 12/13/15 2200  . sodium chloride flush (NS) 0.9 % injection 3 mL  3 mL Intravenous PRN Shaune Pollack, MD      . sulfaSALAzine (AZULFIDINE) tablet 500 mg  500 mg Oral BID PC Shaune Pollack, MD   500 mg at December 13, 2015 1800     Discharge Medications: Please see discharge summary for a list of discharge medications.  Relevant Imaging Results:  Relevant Lab Results:   Additional Information    Dede Query, LCSW

## 2015-11-23 NOTE — Progress Notes (Signed)
Discussed with daughter that 2502 sats were in low 70's on nasal cannula again. Daughter requested for the venturi mask to be put back on, pt now satting 89-93%. Notified Dr. Orvan Falconerampbell and family currently meeting with her. Adelina MingsKim Skila Rollins RN-BC

## 2015-11-24 ENCOUNTER — Inpatient Hospital Stay: Payer: Medicare Other

## 2015-11-24 LAB — BASIC METABOLIC PANEL
ANION GAP: 12 (ref 5–15)
BUN: 24 mg/dL — AB (ref 6–20)
CHLORIDE: 96 mmol/L — AB (ref 101–111)
CO2: 34 mmol/L — ABNORMAL HIGH (ref 22–32)
Calcium: 9.8 mg/dL (ref 8.9–10.3)
Creatinine, Ser: 0.58 mg/dL (ref 0.44–1.00)
GFR calc Af Amer: >60 mL/min (ref >60)
GFR calc non Af Amer: >60 mL/min (ref >60)
Glucose, Bld: 184 mg/dL — ABNORMAL HIGH (ref 65–99)
POTASSIUM: 4.8 mmol/L (ref 3.5–5.1)
SODIUM: 142 mmol/L (ref 135–145)

## 2015-11-24 LAB — GLUCOSE, CAPILLARY
GLUCOSE-CAPILLARY: 167 mg/dL — AB (ref 65–99)
GLUCOSE-CAPILLARY: 160 mg/dL — AB (ref 65–99)
Glucose-Capillary: 169 mg/dL — ABNORMAL HIGH (ref 65–99)
Glucose-Capillary: 108 mg/dL — ABNORMAL HIGH (ref 65–99)
Glucose-Capillary: 206 mg/dL — ABNORMAL HIGH (ref 65–99)

## 2015-11-24 NOTE — Progress Notes (Signed)
Pt stats are 71-76. Called RT and was told to place her on a non-rebreather. Will try this before increasing morphine since pt is so lethargic.

## 2015-11-24 NOTE — Progress Notes (Signed)
[facility] - [place] [facility]   PATIENT NAME: Holly Drake    MR#:  [id number]  DATE OF BIRTH:  06/23/1924  SUBJECTIVE:  CHIEF COMPLAINT:   Chief Complaint  Patient presents with  . Shortness of Breath    patient lives in a memory care unit because of terminal dementia, sent with severe hypoxia. Being treated for COPD.   But due to her dementia she is not compliant with keeping her oxygen on and she runs constantly hypoxic, and she is running tachycardic on cardiac monitor. She is drowsy but easily arousable, but remains confused about her surroundings.  After discussion with palliative care, started on morphin drip, more calm now, still need 5ltr oxygen.  REVIEW OF SYSTEMS:  ROS  Patient is not able to give any review of system because of baseline dementia.  DRUG ALLERGIES:  No Known Allergies  VITALS:  Blood pressure 123/51, pulse 59, temperature 98.1 F (36.7 C), temperature source Oral, resp. rate 18, height 5\' 2"  (1.575 m), weight 61.236 kg (135 lb), SpO2 93 %.  PHYSICAL EXAMINATION:  GENERAL:  82 y.o.-year-old patient lying in the bed with no acute distress. Drowsy. EYES: Pupils equal, round, reactive to light . No scleral icterus. Extraocular muscles intact.  HEENT: Head atraumatic, normocephalic. Oropharynx and nasopharynx clear.  NECK:  Supple, no jugular venous distention. No thyroid enlargement, no tenderness.  LUNGS: Normal breath sounds bilaterally, some wheezing,  no crepitation. positive use of accessory muscles of respiration.     She runs hypoxic, and currently breathing comfortably, does not keep the Ventimask on because of her terminal dementia. CARDIOVASCULAR: S1, S2 normal. Tachycardia No murmurs, rubs, or gallops.  ABDOMEN: Soft, nontender, nondistended. Bowel sounds present. No organomegaly or mass.  EXTREMITIES: No pedal edema, cyanosis, or clubbing. Chronic deformities on both feet present. NEUROLOGIC: Patient is drowsy,  moves her limbs slightly to stimuli mainly upper limbs. She is arousable and opens eyes, but does not talk much. And goes back to sleep  PSYCHIATRIC: The patient is drowsy, easily arousable.  SKIN: No obvious rash, lesion, or ulcer.   Physical Exam LABORATORY PANEL:   CBC  Recent Labs Lab 11/23/15 0446  WBC 18.3*  HGB 11.5*  HCT 35.5  PLT 291   ------------------------------------------------------------------------------------------------------------------  Chemistries   Recent Labs Lab 11/23/15 0446 11/24/15 0552  NA 136 142  K 4.3 4.8  CL 91* 96*  CO2 37* 34*  GLUCOSE 226* 184*  BUN 14 24*  CREATININE 0.52 0.58  CALCIUM 9.1 9.8  MG 2.3  --   AST 17  --   ALT 12*  --   ALKPHOS 64  --   BILITOT 0.6  --    ------------------------------------------------------------------------------------------------------------------  Cardiac Enzymes  Recent Labs Lab 12/13/2015 1018  TROPONINI <0.03   ------------------------------------------------------------------------------------------------------------------  RADIOLOGY:  Dg Chest 1 View  11/24/2015  CLINICAL DATA:  82 year old female with a history of shortness of breath cough and wheezing. EXAM: CHEST 1 VIEW COMPARISON:  11/30/2015, 03/27/2015 FINDINGS: Cardiomediastinal silhouette unchanged. Atherosclerotic calcification of the aortic arch. Diffuse interlobular septal thickening, with developing obscuration of the hemidiaphragms. Blunting of the bilateral costophrenic angle. Calcified granuloma at the left base again noted. Fullness in the central vasculature. No displaced fracture. Scoliotic curvature of the spine. IMPRESSION: Evidence of developing congestive heart failure and small pleural effusions. Correlation with lab values may be useful. Atherosclerosis. Signed, Yvone NeuJaime S. Loreta AveWagner, DO [facility] Electronically Signed   By: Marijean NiemannJaime  Loreta Ave D.O.   On: 11/24/2015  10:19    ASSESSMENT AND PLAN:   Principal Problem:   COPD exacerbation (HCC) Active Problems:   Hypoxia   UTI (lower urinary tract infection)  * Sepsis   Likely secondary to acute bronchitis and COPD exacerbation   UA not checked yet, I will check it today.   Continue with IV antibiotics.  * Ac on ch hypoxic respi failure due to COPD exacerbation   On ch 2 ltr oxygen.   Currently not compliant with venti mask, keep pulling off.   IV steroids, Nebs, IV Abx.   Patient is DO NOT RESUSCITATE, and I explained in this situation to her daughter on phone in detail.she also had meeting with palliative care,decided to keep on nasal canula and wait for improvement.   Due to hypoxia she stays drowsy and would not be able to safely tolerate feeding well oral medications.  * Leukocytosis   Likely due to acute bronchitis, we will check urinalysis with  * Terminal dementia   She lives in a memory care unit, as per daughter up until now she did not had ay trouble swallowing food. SLP eval done, started on puree diet.  * Hypertension and tachycardia   Better with morphine drip..  All the records are reviewed and case discussed with Care Management/Social Workerr. Management plans discussed with the patient, family and they are in agreement.  CODE STATUS: DO NOT RESUSCITATE  TOTAL TIME TAKING CARE OF THIS PATIENT: 35 min. I discussed with her daughter in room in detail about this she understand and agree with current plan, please call palliative care consult to help her make further decisions.   POSSIBLE D/C IN 2-3 DAYS, DEPENDING ON CLINICAL CONDITION.   Altamese Dilling M.D on 11/24/2015   Between 7am to 6pm - Pager - [phone]  After 6pm go to [email or web address] - password [email or web address] [facility]  [place] [facility]  Office  [phone]  CC: Primary care physician; Randie Heinz, NP  Note: This dictation was prepared with Dragon dictation along with smaller phrase  technology. Any transcriptional errors that result from this process are unintentional.

## 2015-11-24 NOTE — Progress Notes (Signed)
Spoke with and notified Lars Massoneresa Mamadou Breon, daughter, about events. Will continue to monitor pt.

## 2015-11-24 NOTE — Progress Notes (Signed)
Discussed pt condition with RT - also agreed to not increase morphine dose yet. Stated that pt does not seem air hungry - but just hypoxic. Pt is not gasping for breath. Non-rebreather placed and on at this time. Will continue to monitor.

## 2015-11-24 NOTE — Plan of Care (Signed)
Problem: Health Behavior/Discharge Planning: Goal: Ability to manage health-related needs will improve Outcome: Not Progressing Pt confused  Problem: Nutrition: Goal: Adequate nutrition will be maintained Outcome: Not Progressing Pt has Poor appetite

## 2015-11-24 NOTE — Progress Notes (Signed)
Daughter and other family member at bedside now.

## 2015-11-24 NOTE — Progress Notes (Signed)
Pt now seems to be gasping for breath. Increased morphine to 2mg /hr. Will notify daughter.

## 2015-11-24 NOTE — Progress Notes (Signed)
Pharmacy Consult for Electrolyte Monitoring   No Known Allergies  Patient Measurements: Height: 5\' 2"  (157.5 cm) Weight: 135 lb (61.236 kg) IBW/kg (Calculated) : 50.1  Vital Signs: Temp: 98.7 F (37.1 C) (03/11 0556) BP: 123/66 mmHg (03/11 0556) Pulse Rate: 71 (03/11 0556) Intake/Output from previous day: 03/10 0701 - 03/11 0700 In: 36.2 [P.O.:25; I.V.:11.2] Out: -  Intake/Output from this shift:    Labs:  Recent Labs  03/26/2016 1018 11/23/15 0446  WBC 21.1* 18.3*  HGB 12.0 11.5*  HCT 36.6 35.5  PLT 303 291     Recent Labs  03/26/2016 1018 03/26/2016 1417 11/23/15 0446 11/24/15 0552  NA 135  --  136 142  K 4.2  --  4.3 4.8  CL 89*  --  91* 96*  CO2 40*  --  37* 34*  GLUCOSE 216*  --  226* 184*  BUN 15  --  14 24*  CREATININE 0.72  --  0.52 0.58  CALCIUM 9.2  --  9.1 9.8  MG  --  1.4* 2.3  --   PROT  --   --  6.0*  --   ALBUMIN  --   --  3.0*  --   AST  --   --  17  --   ALT  --   --  12*  --   ALKPHOS  --   --  64  --   BILITOT  --   --  0.6  --   BILIDIR  --   --  <0.1*  --   IBILI  --   --  NOT CALCULATED  --    Estimated Creatinine Clearance: 41 mL/min (by C-G formula based on Cr of 0.58).    Recent Labs  11/23/15 2123 11/24/15 0739 11/24/15 0949  GLUCAP 164* 167* 169*    Medical History: Past Medical History  Diagnosis Date  . COPD (chronic obstructive pulmonary disease) (HCC)   . Dementia   . OA (osteoarthritis)   . Hypertension   . Chronic respiratory failure (HCC)     Medications:  Scheduled:  . albuterol  7.5 mg Nebulization Once  . cefTRIAXone (ROCEPHIN)  IV  1 g Intravenous Q24H  . citalopram  20 mg Oral Daily  . enoxaparin (LOVENOX) injection  40 mg Subcutaneous Q24H  . insulin aspart  0-5 Units Subcutaneous QHS  . insulin aspart  0-9 Units Subcutaneous TID WC  . levalbuterol  1.25 mg Nebulization 4 times per day  . methylPREDNISolone (SOLU-MEDROL) injection  60 mg Intravenous Q6H  . metoprolol  50 mg Oral BID  .  mometasone-formoterol  2 puff Inhalation BID  . pantoprazole (PROTONIX) IV  40 mg Intravenous Q24H  . sodium chloride flush  3 mL Intravenous Q12H  . sulfaSALAzine  500 mg Oral BID PC   Infusions:  . morphine 1 mg/hr (11/23/15 1818)    Assessment: Pharmacy consulted to assist in monitoring and replacing electrolytes in this 82 y/o F with AECOPD. Electrolytes within normal limits this morning.  Plan:  Patient may become comfort care but has not as of yet.  Will place order for BMET in am.   Bari MantisKristin Masud Holub PharmD Clinical Pharmacist 11/24/2015  9:56 AM

## 2015-11-24 NOTE — Progress Notes (Signed)
Spoke to Dr Desiree HaneVachani in person to clarify pt code status; Dr stated that the way order was written was correct, that she was DNR as written in order.

## 2015-11-24 NOTE — Progress Notes (Signed)
Pt placed on 100% NRB mask due to low O2 saturations in the 80's

## 2015-11-24 NOTE — Progress Notes (Signed)
Initial Nutrition Assessment     INTERVENTION:  Meals and snacks: Monitor intake Medical Nutrition Supplement Therapy: Will add magic cup BID and mightyshake daily for added nutrition and to meet nectar thick consistency guidelines   NUTRITION DIAGNOSIS:   Inadequate oral intake related to poor appetite, lethargy/confusion as evidenced by meal completion < 25%.    GOAL:   Patient will meet greater than or equal to 90% of their needs    MONITOR:    (Energy intake)  REASON FOR ASSESSMENT:   Consult    ASSESSMENT:      Pt admitted with shortness of breath, hypoxia, COPD exacerbation, sepsis  Past Medical History  Diagnosis Date  . COPD (chronic obstructive pulmonary disease) (HCC)   . Dementia   . OA (osteoarthritis)   . Hypertension   . Chronic respiratory failure (HCC)     Current Nutrition: 0% documented per I and O sheet, full breakfast tray at bedside, pt sleeping.  No family present during rounds.  Spoke with RN, Casimiro NeedleMichael and to lethargic to eat this am  Food/Nutrition-Related History: unsure intake prior to admission   Scheduled Medications:  . albuterol  7.5 mg Nebulization Once  . cefTRIAXone (ROCEPHIN)  IV  1 g Intravenous Q24H  . citalopram  20 mg Oral Daily  . enoxaparin (LOVENOX) injection  40 mg Subcutaneous Q24H  . insulin aspart  0-5 Units Subcutaneous QHS  . insulin aspart  0-9 Units Subcutaneous TID WC  . levalbuterol  1.25 mg Nebulization 4 times per day  . methylPREDNISolone (SOLU-MEDROL) injection  60 mg Intravenous Q6H  . metoprolol  50 mg Oral BID  . mometasone-formoterol  2 puff Inhalation BID  . pantoprazole (PROTONIX) IV  40 mg Intravenous Q24H  . sodium chloride flush  3 mL Intravenous Q12H  . sulfaSALAzine  500 mg Oral BID PC    Continuous Medications:  . morphine 1 mg/hr (11/23/15 1818)     Electrolyte/Renal Profile and Glucose Profile:   Recent Labs Lab 11/24/2015 1018 12/08/2015 1417 11/23/15 0446 11/24/15 0552  NA  135  --  136 142  K 4.2  --  4.3 4.8  CL 89*  --  91* 96*  CO2 40*  --  37* 34*  BUN 15  --  14 24*  CREATININE 0.72  --  0.52 0.58  CALCIUM 9.2  --  9.1 9.8  MG  --  1.4* 2.3  --   GLUCOSE 216*  --  226* 184*   Protein Profile:  Recent Labs Lab 11/23/15 0446  ALBUMIN 3.0*    Gastrointestinal Profile: Last BM: 3/9   Nutrition-Focused Physical Exam Findings: deferred at this time   Weight Change: noted wt gain per wt encounters    Diet Order:  DIET - DYS 1 Room service appropriate?: Yes with Assist; Fluid consistency:: Nectar Thick  Skin:   reviewed   Height:   Ht Readings from Last 1 Encounters:  12/10/2015 5\' 2"  (1.575 m)    Weight:   Wt Readings from Last 1 Encounters:  12/13/2015 135 lb (61.236 kg)    Ideal Body Weight:     BMI:  Body mass index is 24.69 kg/(m^2).  Estimated Nutritional Needs:   Kcal:  BEE 988 kcals (IF 1.0-1.2, AF 1.2)1185-1422 kcals/d.   Protein:  (1.0-1.2 g/kg) 61-73 g/kg  Fluid:  (30-7735ml/kg) 1830-218335ml/d  EDUCATION NEEDS:   No education needs identified at this time  MODERATE Care Level  Satvik Parco B. Freida BusmanAllen, RD, LDN [phone] (pager) Weekend/On-Call pager (  [phone])  

## 2015-11-25 LAB — BASIC METABOLIC PANEL
ANION GAP: 7 (ref 5–15)
BUN: 48 mg/dL — ABNORMAL HIGH (ref 6–20)
CO2: 40 mmol/L — ABNORMAL HIGH (ref 22–32)
Calcium: 9.7 mg/dL (ref 8.9–10.3)
Chloride: 95 mmol/L — ABNORMAL LOW (ref 101–111)
Creatinine, Ser: 1.57 mg/dL — ABNORMAL HIGH (ref 0.44–1.00)
GFR calc Af Amer: 33 mL/min — ABNORMAL LOW (ref >60)
GFR, EST NON AFRICAN AMERICAN: 28 mL/min — AB (ref >60)
Glucose, Bld: 181 mg/dL — ABNORMAL HIGH (ref 65–99)
POTASSIUM: 6.2 mmol/L — AB (ref 3.5–5.1)
SODIUM: 142 mmol/L (ref 135–145)

## 2015-11-25 LAB — MAGNESIUM: MAGNESIUM: 2.8 mg/dL — AB (ref 1.7–2.4)

## 2015-11-25 LAB — GLUCOSE, CAPILLARY: Glucose-Capillary: 144 mg/dL — ABNORMAL HIGH (ref 65–99)

## 2015-11-25 NOTE — Progress Notes (Signed)
Pharmacy Consult for Electrolyte Monitoring   No Known Allergies  Patient Measurements: Height: 5\' 2"  (157.5 cm) Weight: 135 lb (61.236 kg) IBW/kg (Calculated) : 50.1  Vital Signs: Temp: 93.8 F (34.3 C) (03/12 0650) Temp Source: Tympanic (03/12 0650) BP: 89/42 mmHg (03/12 0509) Pulse Rate: 57 (03/12 0509) Intake/Output from previous day: 03/11 0701 - 03/12 0700 In: 18.5 [I.V.:18.5] Out: 0  Intake/Output from this shift:    Labs:  Recent Labs  12/01/2015 1018 11/23/15 0446  WBC 21.1* 18.3*  HGB 12.0 11.5*  HCT 36.6 35.5  PLT 303 291     Recent Labs  12/02/2015 1417 11/23/15 0446 11/24/15 0552 11/25/15 0553  NA  --  136 142 142  K  --  4.3 4.8 6.2*  CL  --  91* 96* 95*  CO2  --  37* 34* 40*  GLUCOSE  --  226* 184* 181*  BUN  --  14 24* 48*  CREATININE  --  0.52 0.58 1.57*  CALCIUM  --  9.1 9.8 9.7  MG 1.4* 2.3  --  2.8*  PROT  --  6.0*  --   --   ALBUMIN  --  3.0*  --   --   AST  --  17  --   --   ALT  --  12*  --   --   ALKPHOS  --  64  --   --   BILITOT  --  0.6  --   --   BILIDIR  --  <0.1*  --   --   IBILI  --  NOT CALCULATED  --   --    Estimated Creatinine Clearance: 20.9 mL/min (by C-G formula based on Cr of 1.57).    Recent Labs  11/24/15 1623 11/24/15 2144 11/25/15 0627  GLUCAP 108* 206* 144*    Medical History: Past Medical History  Diagnosis Date  . COPD (chronic obstructive pulmonary disease) (HCC)   . Dementia   . OA (osteoarthritis)   . Hypertension   . Chronic respiratory failure (HCC)     Medications:  Scheduled:  . albuterol  7.5 mg Nebulization Once  . cefTRIAXone (ROCEPHIN)  IV  1 g Intravenous Q24H  . insulin aspart  0-5 Units Subcutaneous QHS  . insulin aspart  0-9 Units Subcutaneous TID WC  . levalbuterol  1.25 mg Nebulization 4 times per day  . sodium chloride flush  3 mL Intravenous Q12H   Infusions:  . morphine 2 mg/hr (11/24/15 2112)    Assessment: Pharmacy consulted to assist in monitoring and  replacing electrolytes in this 82 y/o F with AECOPD.  Plan:  Patient may become comfort care but has not as of yet.  3/12:  Scr increased, Potassium&Mag increased. Patient on verge of comfort care? On morphine drip for air hunger. Per MD note today:Body core temp is also droping and BP is on lower side, she has agonal breathing. Will hold off on ordering electrolyte labs at this time.   Bari MantisKristin Avalynne Diver PharmD Clinical Pharmacist 11/25/2015  9:00 AM

## 2015-11-25 NOTE — Plan of Care (Signed)
Problem: Bowel/Gastric: Goal: Will not experience complications related to bowel motility Outcome: Not Progressing pt placed on comfort care

## 2015-11-25 NOTE — Progress Notes (Signed)
Notified Dr. Elisabeth PigeonVachhani of last nights events and this AMs low temp and BP. MD states that he thinks the patient is actively dying and will speak with family about comfort care for patient today. No new orders at this time. Gave report to oncoming shift.

## 2015-11-25 NOTE — Progress Notes (Signed)
[facility] [place] [facility]   PATIENT NAME: Holly Drake    MR#:  [id number]  DATE OF BIRTH:  03/01/1924  SUBJECTIVE:  CHIEF COMPLAINT:   Chief Complaint  Patient presents with  . Shortness of Breath    patient lives in a memory care unit because of terminal dementia, sent with severe hypoxia. Being treated for COPD.   But due to her dementia she is not compliant with keeping her oxygen on and she runs constantly hypoxic, and she is running tachycardic on cardiac monitor.  After discussion with palliative care, started on morphin drip.  Overnight and early morning 11/25/15- had drop in oxygen again- and had to go on NRBM and increased Morphin drip.   Body core temp is also droping and BP is on lower side, she has agonal breathing.  REVIEW OF SYSTEMS:  ROS  Patient is not able to give any review of system because of mental status.  DRUG ALLERGIES:  No Known Allergies  VITALS:  Blood pressure 89/42, pulse 57, temperature 93.8 F (34.3 C), temperature source Tympanic, resp. rate 16, height 5\' 2"  (1.575 m), weight 61.236 kg (135 lb), SpO2 99 %.  PHYSICAL EXAMINATION:  GENERAL:  82 y.o.-year-old patient lying in the bed with no acute distress. Drowsy. Agonal breathing. EYES: Pupils equal, round, reactive to light . No scleral icterus. Extraocular muscles intact.  HEENT: Head atraumatic, normocephalic. Oropharynx and nasopharynx clear.  NECK:  Supple, no jugular venous distention. No thyroid enlargement, no tenderness.  LUNGS: no wheezing,  no crepitation. Have agonal breathing, On NRBM. CARDIOVASCULAR: S1, S2 normal. Tachycardia No murmurs, rubs, or gallops.  ABDOMEN: Soft, nontender, nondistended. Bowel sounds present. No organomegaly or mass.  EXTREMITIES: No pedal edema, cyanosis, or clubbing. Chronic deformities on both feet present. NEUROLOGIC: Patient is drowsy,on morphin drip, not responsive to stimuli.  PSYCHIATRIC: The patient is drowsy, not  arousable.  SKIN: No obvious rash, lesion, or ulcer.   Physical Exam LABORATORY PANEL:   CBC  Recent Labs Lab 11/23/15 0446  WBC 18.3*  HGB 11.5*  HCT 35.5  PLT 291   ------------------------------------------------------------------------------------------------------------------  Chemistries   Recent Labs Lab 11/23/15 0446  11/25/15 0553  NA 136  < > 142  K 4.3  < > 6.2*  CL 91*  < > 95*  CO2 37*  < > 40*  GLUCOSE 226*  < > 181*  BUN 14  < > 48*  CREATININE 0.52  < > 1.57*  CALCIUM 9.1  < > 9.7  MG 2.3  --  2.8*  AST 17  --   --   ALT 12*  --   --   ALKPHOS 64  --   --   BILITOT 0.6  --   --   < > = values in this interval not displayed. ------------------------------------------------------------------------------------------------------------------  Cardiac Enzymes  Recent Labs Lab 01/19/2016 1018  TROPONINI <0.03   ------------------------------------------------------------------------------------------------------------------  RADIOLOGY:  Dg Chest 1 View  11/24/2015  CLINICAL DATA:  82 year old female with a history of shortness of breath cough and wheezing. EXAM: CHEST 1 VIEW COMPARISON:  2016/07/13, 03/27/2015 FINDINGS: Cardiomediastinal silhouette unchanged. Atherosclerotic calcification of the aortic arch. Diffuse interlobular septal thickening, with developing obscuration of the hemidiaphragms. Blunting of the bilateral costophrenic angle. Calcified granuloma at the left base again noted. Fullness in the central vasculature. No displaced fracture. Scoliotic curvature of the spine. IMPRESSION: Evidence of developing congestive heart failure and small pleural effusions. Correlation with lab values may be  useful. Atherosclerosis. Signed, Yvone Neu. Loreta Ave, DO [facility] Electronically Signed   By: Gilmer Mor D.O.   On: 11/24/2015 10:19    ASSESSMENT AND PLAN:   Principal Problem:   COPD  exacerbation (HCC) Active Problems:   Hypoxia   UTI (lower urinary tract infection)  * Sepsis   Likely secondary to acute bronchitis and COPD exacerbation   UA not checked .   Continue with IV antibiotics.  * Ac on ch hypoxic respi failure due to COPD exacerbation   On ch 2 ltr oxygen.   Initially not compliant with venti mask, keep pulling off.   IV steroids, Nebs, IV Abx.   Patient is DO NOT RESUSCITATE, and I explained in this situation to her daughter on phone in detail.she also had meeting with palliative care,decided to keep on nasal canula and wait for improvement with IV Abx.   Due to hypoxia she stays drowsy and would not be able to safely tolerate feeding well oral medications.   Started on morphine drip also, and on NRBM.   i discussed with her daughter on phone,and she understands - pt is near death now- will continue to keep her comfortable.  * Leukocytosis   Likely due to acute bronchitis  * Terminal dementia   She lives in a memory care unit, as per daughter up until now she did not had ay trouble swallowing food. SLP eval done, started on puree diet.  * Hypertension and tachycardia   Better with morphine drip..  All the records are reviewed and case discussed with Care Management/Social Workerr. Management plans discussed with the patient, family and they are in agreement.  CODE STATUS: DO NOT RESUSCITATE  TOTAL TIME TAKING CARE OF THIS PATIENT: 35 min. I discussed with her daughter on phone in detail about this she understand and agree with current plan She and her family were here early morning and saw that pt is very near death now.  POSSIBLE D/C IN ? DAYS, DEPENDING ON CLINICAL CONDITION.   Altamese Dilling M.D on 11/25/2015   Between 7am to 6pm - Pager - [phone]  After 6pm go to [email or web address] - password [email or web address] [facility]  [place] [facility]  Office  [phone]  CC: Primary care physician; Randie Heinz, NP  Note: This  dictation was prepared with Dragon dictation along with smaller phrase technology. Any transcriptional errors that result from this process are unintentional.

## 2015-11-27 LAB — CULTURE, BLOOD (ROUTINE X 2)
Culture: NO GROWTH
Culture: NO GROWTH

## 2015-12-15 NOTE — Progress Notes (Signed)
At 0245 RN making rounds noticed patient not breathing, assessed for apical heartbeat , none audible. Dorathy DaftMarsha Hatch, RN verified no apical heartbeat. Nursing Supervisor Bynum Bellowsnn Pride also verified. Notified Dr Sheryle Hailiamond, family- daughter Raylene Miyamotoeresa K Smith. Notified [facility]- patient not approved for organ donation.Patient prepared for family viewing.

## 2015-12-15 NOTE — Discharge Summary (Signed)
Date of death- 28-Nov-2015  Cause of death- acute bronchitis, COPD  Hospital course    Patient was sent from nursing home with hypoxia and COPD exacerbation, she was given IV antibiotics and nebulizer therapy, she was requiring high amount of oxygen and initially was maintained on nonrebreather mask, palliative care met with the patient's family and she agreed on keeping her DO NOT RESUSCITATE and he agreed to start on morphine drip to keep her comfortable with her breathing. Gradually we were able to bring down her oxygen requirement to 5-6 L nasal cannula but patient continued to stay slightly hypoxic side with that with IV morphine drip. On further meetings with family I informed him that that she is having abnormal bleeding and overall going down, they understood that and accepted the outcome and made her completely comfort care. She died on 2022-11-28.  For further details please see history and physical and earlier progress notes done.

## 2015-12-15 DEATH — deceased

## 2017-01-22 IMAGING — CR DG CHEST 1V PORT
1 series · 1 of 1 positions shown · non-contrast
Comparison: 02/19/2015

CLINICAL DATA: Hypoxia today, recent hip surgery

EXAM:
PORTABLE CHEST - 1 VIEW

[ap]
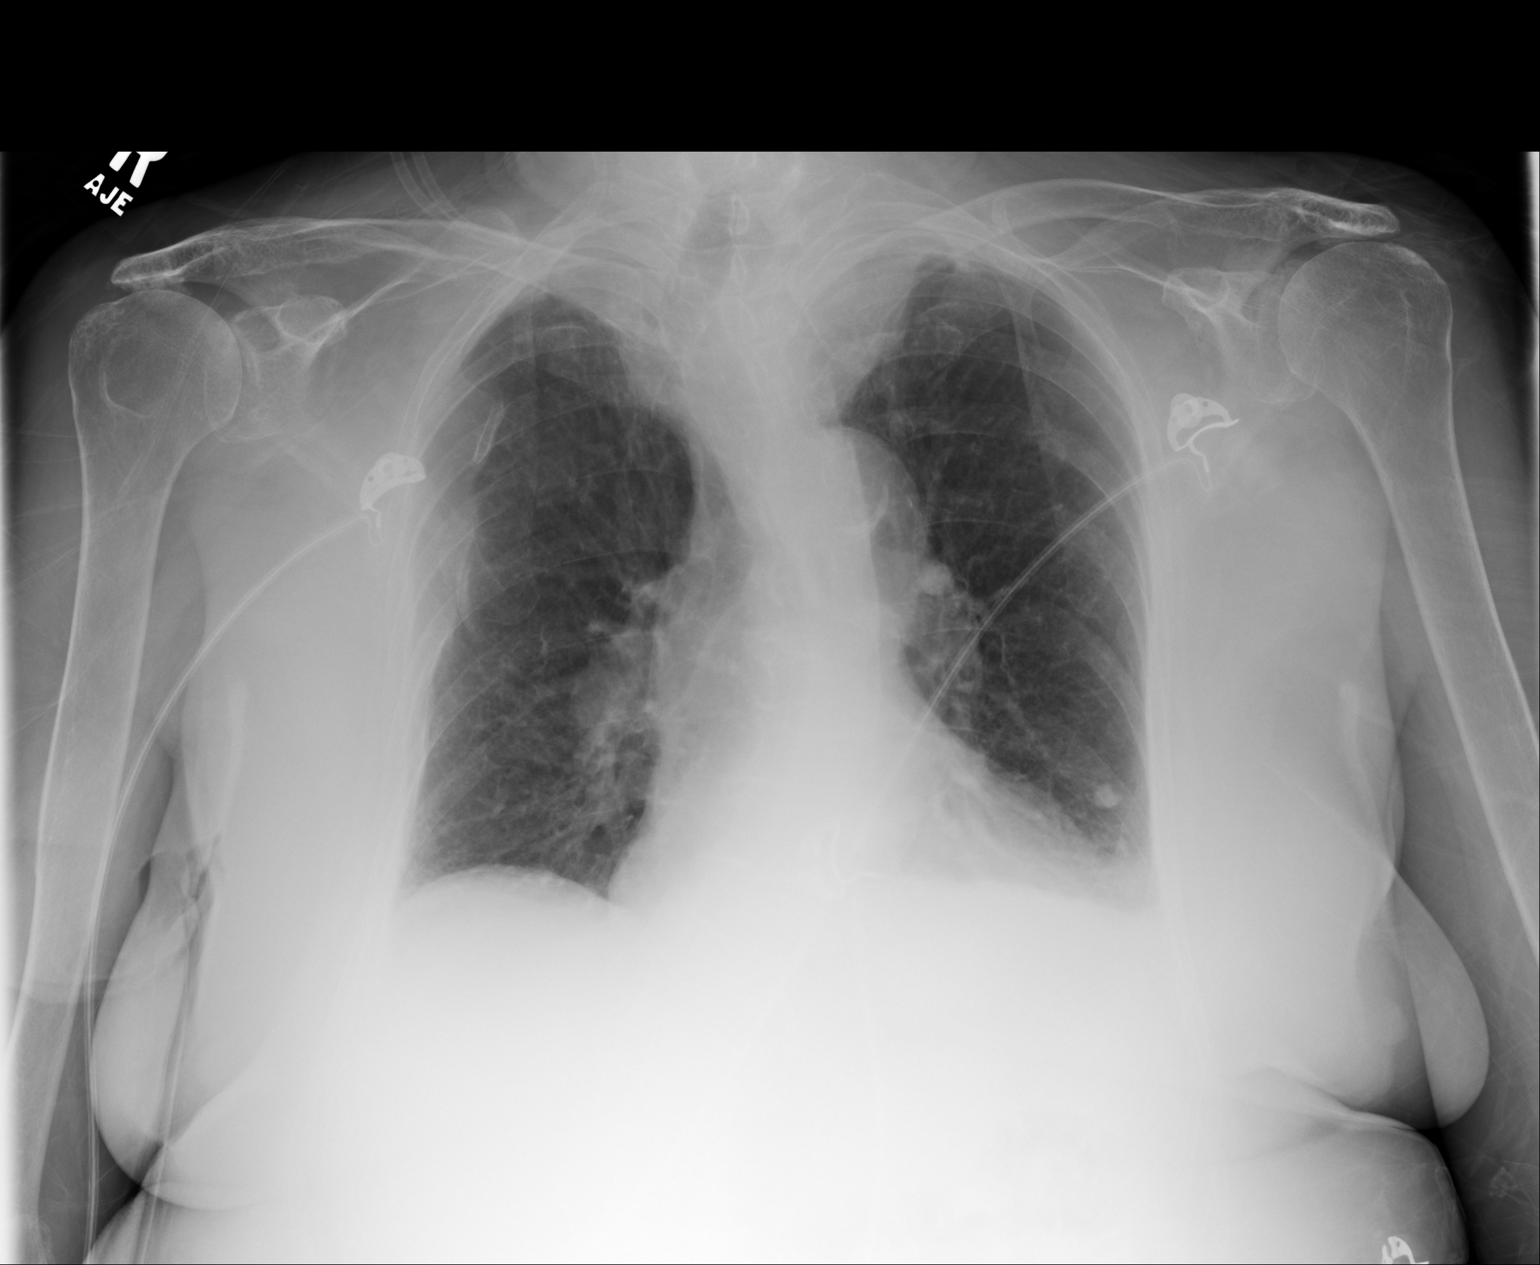

[1 of 1 positions shown; findings below may reference images not displayed]

FINDINGS: Stable cardiac silhouette upper normal. Stable mild diffuse
interstitial prominence. Calcified granuloma left base. Calcified
lymph node in the left hilum. Stable density right apex.
IMPRESSION: No change from 02/19/2015.
